# Patient Record
Sex: Male | Born: 1945 | Race: White | Hispanic: No | Marital: Married | Smoking: Never smoker
Health system: Southern US, Community
[De-identification: ages and names within clinical notes are randomized; demographics above are authoritative.]

## PROBLEM LIST (undated history)

## (undated) DIAGNOSIS — M109 Gout, unspecified: Secondary | ICD-10-CM

## (undated) DIAGNOSIS — K219 Gastro-esophageal reflux disease without esophagitis: Secondary | ICD-10-CM

## (undated) DIAGNOSIS — G7000 Myasthenia gravis without (acute) exacerbation: Secondary | ICD-10-CM

## (undated) DIAGNOSIS — C801 Malignant (primary) neoplasm, unspecified: Secondary | ICD-10-CM

## (undated) HISTORY — PX: PROSTATE SURGERY: SHX751

## (undated) HISTORY — PX: CHOLECYSTECTOMY: SHX55

---

## 2000-01-09 ENCOUNTER — Encounter: Payer: Self-pay | Admitting: Emergency Medicine

## 2000-01-09 ENCOUNTER — Emergency Department (HOSPITAL_COMMUNITY): Admission: EM | Admit: 2000-01-09 | Discharge: 2000-01-09 | Payer: Self-pay | Admitting: Emergency Medicine

## 2000-01-24 ENCOUNTER — Emergency Department (HOSPITAL_COMMUNITY): Admission: EM | Admit: 2000-01-24 | Discharge: 2000-01-24 | Payer: Self-pay

## 2000-01-27 ENCOUNTER — Encounter: Payer: Self-pay | Admitting: Emergency Medicine

## 2000-01-27 ENCOUNTER — Ambulatory Visit (HOSPITAL_COMMUNITY): Admission: EM | Admit: 2000-01-27 | Discharge: 2000-01-27 | Payer: Self-pay | Admitting: Emergency Medicine

## 2000-01-27 ENCOUNTER — Encounter: Payer: Self-pay | Admitting: Urology

## 2000-01-30 ENCOUNTER — Ambulatory Visit (HOSPITAL_COMMUNITY): Admission: RE | Admit: 2000-01-30 | Discharge: 2000-01-30 | Payer: Self-pay | Admitting: Urology

## 2000-01-30 ENCOUNTER — Encounter: Payer: Self-pay | Admitting: Urology

## 2000-02-03 ENCOUNTER — Emergency Department (HOSPITAL_COMMUNITY): Admission: EM | Admit: 2000-02-03 | Discharge: 2000-02-03 | Payer: Self-pay

## 2000-02-05 ENCOUNTER — Ambulatory Visit (HOSPITAL_COMMUNITY): Admission: RE | Admit: 2000-02-05 | Discharge: 2000-02-05 | Payer: Self-pay | Admitting: Urology

## 2004-10-01 DIAGNOSIS — K219 Gastro-esophageal reflux disease without esophagitis: Secondary | ICD-10-CM | POA: Diagnosis present

## 2005-03-02 ENCOUNTER — Ambulatory Visit: Payer: Self-pay | Admitting: Internal Medicine

## 2008-12-18 ENCOUNTER — Emergency Department (HOSPITAL_BASED_OUTPATIENT_CLINIC_OR_DEPARTMENT_OTHER): Admission: EM | Admit: 2008-12-18 | Discharge: 2008-12-18 | Payer: Self-pay | Admitting: Emergency Medicine

## 2009-11-18 ENCOUNTER — Ambulatory Visit: Admission: RE | Admit: 2009-11-18 | Discharge: 2009-12-09 | Payer: Self-pay | Admitting: Radiation Oncology

## 2010-02-01 ENCOUNTER — Encounter (INDEPENDENT_AMBULATORY_CARE_PROVIDER_SITE_OTHER): Payer: Self-pay | Admitting: Urology

## 2010-02-01 ENCOUNTER — Inpatient Hospital Stay (HOSPITAL_COMMUNITY): Admission: RE | Admit: 2010-02-01 | Discharge: 2010-02-03 | Payer: Self-pay | Admitting: Urology

## 2010-12-19 LAB — COMPREHENSIVE METABOLIC PANEL
ALT: 35 U/L (ref 0–53)
AST: 26 U/L (ref 0–37)
Albumin: 4.1 g/dL (ref 3.5–5.2)
Alkaline Phosphatase: 38 U/L — ABNORMAL LOW (ref 39–117)
CO2: 26 mEq/L (ref 19–32)
Chloride: 110 mEq/L (ref 96–112)
GFR calc Af Amer: 59 mL/min — ABNORMAL LOW (ref >60)
GFR calc non Af Amer: 49 mL/min — ABNORMAL LOW (ref >60)
Potassium: 3.8 mEq/L (ref 3.5–5.1)
Sodium: 143 mEq/L (ref 135–145)
Total Bilirubin: 0.7 mg/dL (ref 0.3–1.2)

## 2010-12-19 LAB — DIFFERENTIAL
Basophils Absolute: 0 10*3/uL (ref 0.0–0.1)
Basophils Relative: 0 % (ref 0–1)
Basophils Relative: 0 % (ref 0–1)
Eosinophils Absolute: 0 10*3/uL (ref 0.0–0.7)
Eosinophils Relative: 0 % (ref 0–5)
Lymphocytes Relative: 11 % — ABNORMAL LOW (ref 12–46)
Monocytes Absolute: 0.7 10*3/uL (ref 0.1–1.0)
Neutro Abs: 7.7 10*3/uL (ref 1.7–7.7)
Neutrophils Relative %: 81 % — ABNORMAL HIGH (ref 43–77)
Neutrophils Relative %: 89 % — ABNORMAL HIGH (ref 43–77)

## 2010-12-19 LAB — BASIC METABOLIC PANEL
BUN: 15 mg/dL (ref 6–23)
CO2: 26 mEq/L (ref 19–32)
CO2: 27 mEq/L (ref 19–32)
Calcium: 7.7 mg/dL — ABNORMAL LOW (ref 8.4–10.5)
Chloride: 106 mEq/L (ref 96–112)
Creatinine, Ser: 1.43 mg/dL (ref 0.4–1.5)
Creatinine, Ser: 1.43 mg/dL (ref 0.4–1.5)
GFR calc Af Amer: 60 mL/min — ABNORMAL LOW (ref >60)
GFR calc non Af Amer: 49 mL/min — ABNORMAL LOW (ref >60)
Glucose, Bld: 137 mg/dL — ABNORMAL HIGH (ref 70–99)
Glucose, Bld: 147 mg/dL — ABNORMAL HIGH (ref 70–99)
Potassium: 4.1 mEq/L (ref 3.5–5.1)
Sodium: 137 mEq/L (ref 135–145)

## 2010-12-19 LAB — ABO/RH: ABO/RH(D): A POS

## 2010-12-19 LAB — APTT: aPTT: 28 seconds (ref 24–37)

## 2010-12-19 LAB — CBC
HCT: 44.5 % (ref 39.0–52.0)
Hemoglobin: 13.1 g/dL (ref 13.0–17.0)
MCHC: 33.8 g/dL (ref 30.0–36.0)
MCHC: 33.9 g/dL (ref 30.0–36.0)
MCV: 88.4 fL (ref 78.0–100.0)
Platelets: 160 10*3/uL (ref 150–400)
Platelets: 182 10*3/uL (ref 150–400)
RBC: 4.35 MIL/uL (ref 4.22–5.81)
RBC: 4.78 MIL/uL (ref 4.22–5.81)
RDW: 13.1 % (ref 11.5–15.5)
RDW: 13.1 % (ref 11.5–15.5)
WBC: 5.5 10*3/uL (ref 4.0–10.5)

## 2010-12-19 LAB — URINALYSIS, ROUTINE W REFLEX MICROSCOPIC
Bilirubin Urine: NEGATIVE
Glucose, UA: NEGATIVE mg/dL
Hgb urine dipstick: NEGATIVE
Nitrite: NEGATIVE
Specific Gravity, Urine: 1.018 (ref 1.005–1.030)
pH: 5.5 (ref 5.0–8.0)

## 2010-12-19 LAB — CREATININE, FLUID (PLEURAL, PERITONEAL, JP DRAINAGE)

## 2010-12-19 LAB — TYPE AND SCREEN: ABO/RH(D): A POS

## 2011-02-16 NOTE — Op Note (Signed)
[facility]  Patient:    Tyrone Newton, Tyrone Newton                     MRN: [id number] Proc. Date: 01/27/00 Adm. Date:  [id number] Disc. Date: [id number] Attending:  Ilene Qua CC:         Maretta Bees. Vonita Moss, M.D.                           Operative Report  PREOPERATIVE DIAGNOSIS:  Left ureteral stone with hydronephrosis.  POSTOPERATIVE DIAGNOSIS:  Left ureteral stone with hydronephrosis with urethral  stricture.  OPERATION PERFORMED:  Cysto, urethral dilation, left retrograde ureteropyelogram, double-J stent placement.  SURGEON:  Bertram Millard. Dahlstedt, M.D.  ANESTHESIA:  General with endotracheal.  COMPLICATIONS:  None.  INDICATIONS FOR PROCEDURE:  The patient is a 65 year old male who has been suffering with a left ureteral calculus for over a week.  Today marks the fourth time in the past several days he has sought medical attention for this.  He is ot controlled adequately with Mepergan.  The patient has had no fever or chills.  The patient presented to the emergency department after talking with me on the phone today.  At this point he requests intervention of this stone with stent placement versus uteroscopy.  These options were discussed with the patient. He understands these and desires to proceed.  DESCRIPTION OF PROCEDURE:  The patient was taken to the operating room after intravenous preoperative anesthetics were administered.  He was placed in the dorsal lithotomy position after general endotracheal anesthesia was administered. His genitalia and perineum were prepped and draped.  A 25 French panendoscope was advanced through his anterior urethra.  Mild bulbous urethral stricture was encountered.  It was dilated first to 30 [place] using [employer].  The bladder was then inspected circumferentially.  It was found to be normal.  The eft ureteral orifice was cannulated and a retrograde pyelogram was performed.  This  showed a  normal ureter until the ureteropelvic junction was encountered.  At that point there was a filling defect and contrast showed mild hydro of the left kidney. This stone was too high to entertain using a rigid ureteroscope, as the patient was quite large and there was a very proximal stone.  At this point I felt it best o place double-J stent.  A glide wire was eventually advanced into the orifice and up to the ureter past the stone.  Using fluoroscopic and cystoscopic assistance, a  double-J stent (26 cm x 6 French Lubriflex) was placed.  The guide wire was removed and good proximal and distal curls were seen.  At this point the bladder was drained and the procedure terminated.  The patient tolerated the procedure well.  He was returned to the PACU in stable condition. DD:  01/27/00 TD:  01/30/00 Job: [id number]

## 2011-02-16 NOTE — Op Note (Signed)
[facility]  Patient:    Tyrone Newton, Tyrone Newton                     MRN: [id number] Proc. Date: 02/05/00 Adm. Date:  [id number] Disc. Date: [id number] Attending:  Lauree Chandler                           Operative Report  PREOPERATIVE DIAGNOSIS:  Left renal calculus.  POSTOPERATIVE DIAGNOSIS:  Left renal calculus.  PROCEDURE:  Cystoscopy, left ureteroscopic stone fragmentation with holmium laser, left retrograde pyelogram with interpretation with insertion of left double-J catheter.  SURGEON:  Maretta Bees. Vonita Moss, M.D.  ANESTHESIA:  General.  INDICATIONS:  This gentleman is 65 years old and has a remote history with stone. He now has had three week history of intermittent left flank pain due to a 3-4 m stone detected on CT that was not visible on KUB, and during the weekend, he had significant pain, and a double-J catheter was placed by Bertram Millard. Dahlstedt, M.D. I saw him in the office earlier this week and repeated a CT and the stone is in the left mid calyceal system.  The patient wanted intervention to try and get rid of this very troublesome stone.  Unfortunately, it could be seen on x-ray to perform ESL, so counseled about ureteroscopic approach.  DESCRIPTION OF PROCEDURE:  The patient was brought to the operating room and placed in the lithotomy position.  External genitalia were prepped and draped in the usual fashion.  He was cystoscoped and the lower end of the left double-J catheter was visualized and removed with alligator forceps.  I could not get a flexible guidewire up of a middle variety, so I used a guidewire M which did go up easily and all the way into the renal collecting system.  Over this, I passed an open-ended ureteral catheter, replaced the guidewire M with a metal guidewire.  Over the metal guidewire, I passed the ureteral sheath dilating mechanism which  went quite nicely.  With the guidewire still in place, I  used the flexible ultrasound thin ureteroscope and negotiated my way easily into the kidney where I found the gold and yellow stone in the mid calyceal system.  I tried to grab the stone with a cherry picker, but it would not hold the stone adequately.  I then  used the [facility] stone basket and secured the stone, but it would not go through the UPJ very easily.  I felt it was hazardous to do so, so I released the stone from the basket, and then utilized the holmium laser and fragmented the stone into five or six smaller pieces, and some fine stony debris.  At this point, I performed a left retrograde pyelogram which showed the pyelocalyceal system to be intact, and I removed the ureteroscope, and back loaded the guidewire into a cystoscope, and then placed a 6-French 26 cm double-J catheter with a coil in the renal pelvis, and a full coil in the bladder, and the string  brought out through urethra.  X-rays and photos documenting the pyelogram and the presence of the stone before and after fragmentation were completed.  The patient tolerated the procedure well and was taken to the recovery room in ood condition. DD:  02/05/00 TD:  02/06/00 Job: [id number]

## 2013-07-28 ENCOUNTER — Encounter (HOSPITAL_BASED_OUTPATIENT_CLINIC_OR_DEPARTMENT_OTHER): Payer: Self-pay | Admitting: Emergency Medicine

## 2013-07-28 ENCOUNTER — Emergency Department (HOSPITAL_BASED_OUTPATIENT_CLINIC_OR_DEPARTMENT_OTHER)
Admission: EM | Admit: 2013-07-28 | Discharge: 2013-07-29 | Disposition: A | Discharge status: Home / Self Care | Payer: Medicare Other | Attending: Emergency Medicine | Admitting: Emergency Medicine

## 2013-07-28 DIAGNOSIS — N390 Urinary tract infection, site not specified: Secondary | ICD-10-CM | POA: Insufficient documentation

## 2013-07-28 DIAGNOSIS — R5381 Other malaise: Secondary | ICD-10-CM | POA: Insufficient documentation

## 2013-07-28 DIAGNOSIS — Z8546 Personal history of malignant neoplasm of prostate: Secondary | ICD-10-CM | POA: Insufficient documentation

## 2013-07-28 LAB — URINE MICROSCOPIC-ADD ON

## 2013-07-28 LAB — URINALYSIS, ROUTINE W REFLEX MICROSCOPIC
Specific Gravity, Urine: 1.017 (ref 1.005–1.030)
Urobilinogen, UA: 1 mg/dL (ref 0.0–1.0)
pH: 6 (ref 5.0–8.0)

## 2013-07-28 MED ORDER — ACETAMINOPHEN 325 MG PO TABS
650.0000 mg | ORAL_TABLET | Freq: Once | ORAL | Status: AC
  Administered 2013-07-28: 650 mg via ORAL
  Filled 2013-07-28: qty 2

## 2013-07-28 NOTE — ED Notes (Signed)
Wife has been taking Pt. Temp and B/P at home with elevated temp and b/p.  Pt. Reports he does not take b/p meds.

## 2013-07-29 LAB — URINE CULTURE

## 2013-07-29 MED ORDER — CIPROFLOXACIN HCL 500 MG PO TABS: 500.0000 mg | ORAL_TABLET | Freq: Two times a day (BID) | ORAL | Status: DC

## 2013-07-29 MED ORDER — CIPROFLOXACIN HCL 500 MG PO TABS
500.0000 mg | ORAL_TABLET | Freq: Once | ORAL | Status: AC
  Administered 2013-07-29: 500 mg via ORAL
  Filled 2013-07-29: qty 1

## 2013-07-29 NOTE — ED Provider Notes (Signed)
CSN: [id number]     Arrival date & time 07/28/13  2042 History   First MD Initiated Contact with Patient 07/29/13 0056     Chief Complaint  Patient presents with  . Fever   (Consider location/radiation/quality/duration/timing/severity/associated sxs/prior Treatment) HPI This is a 67 year old male who is healthy apart from a history of prostate cancer and occasional urinary tract infections. He was mowing his lawn yesterday afternoon when he became extremely fatigued is unusual for him. He was unable to finish mowing his lawn due to this fatigue. His wife checked his blood pressure several times and found to be in the 160s over 80s and he had a temperature of 102. He has had urinary frequency but no burning with urination. He has had nausea and decreased appetite. He denies chills. He denies abdominal pain. He denies vomiting or diarrhea.  He has partial hearing loss and did not bring his hearing aids with him.  No past medical history on file. Past Surgical History  Procedure Laterality Date  . Prostate surgery      due to cancer   No family history on file. History  Substance Use Topics  . Smoking status: Never Smoker   . Smokeless tobacco: Not on file  . Alcohol Use: No    Review of Systems  All other systems reviewed and are negative.    Allergies  Review of patient's allergies indicates no known allergies.  Home Medications  No current outpatient prescriptions on file. BP 153/79  Pulse 72  Temp(Src) 98.2 F (36.8 C) (Oral)  Resp 18  Ht 6\' 2"  (1.88 m)  Wt 240 lb (108.863 kg)  BMI 30.8 kg/m2  SpO2 100%  Physical Exam General: Well-developed, well-nourished male in no acute distress; appearance consistent with age of record HENT: normocephalic; atraumatic; hard of hearing Eyes: pupils equal, round and reactive to light; extraocular muscles intact Neck: supple Heart: regular rate and rhythm Lungs: clear to auscultation bilaterally Abdomen: soft; nondistended;  slight suprapubic tenderness; no masses or hepatosplenomegaly; bowel sounds present GU: No CVA tenderness Extremities: No deformity; full range of motion; pulses normal; no edema Neurologic: Awake, alert and oriented; motor function intact in all extremities and symmetric; no facial droop Skin: Warm and dry Psychiatric: Normal mood and affect     ED Course  Procedures (including critical care time)  MDM   Nursing notes and vitals signs, including pulse oximetry, reviewed.  Summary of this visit's results, reviewed by myself:  Labs:  Results for orders placed during the hospital encounter of 07/28/13 (from the past 24 hour(s))  URINALYSIS, ROUTINE W REFLEX MICROSCOPIC     Status: Abnormal   Collection Time    07/28/13  9:09 PM      Result Value Range   Color, Urine YELLOW  YELLOW   APPearance CLEAR  CLEAR   Specific Gravity, Urine 1.017  1.005 - 1.030   pH 6.0  5.0 - 8.0   Glucose, UA NEGATIVE  NEGATIVE mg/dL   Hgb urine dipstick MODERATE (*) NEGATIVE   Bilirubin Urine NEGATIVE  NEGATIVE   Ketones, ur 15 (*) NEGATIVE mg/dL   Protein, ur 30 (*) NEGATIVE mg/dL   Urobilinogen, UA 1.0  0.0 - 1.0 mg/dL   Nitrite POSITIVE (*) NEGATIVE   Leukocytes, UA MODERATE (*) NEGATIVE  URINE MICROSCOPIC-ADD ON     Status: Abnormal   Collection Time    07/28/13  9:09 PM      Result Value Range   Squamous Epithelial / LPF RARE  RARE   WBC, UA 11-20  <3 WBC/hpf   RBC / HPF 7-10  <3 RBC/hpf   Bacteria, UA FEW (*) RARE   The patient and his wife were reassured that the blood pressure in the 160s over the 80s systolic short-term emergency. He should monitor his blood pressure on a daily basis and discuss long-term management with his primary care physician.    Hanley Seamen, MD 07/29/13 [phone]

## 2014-09-29 DIAGNOSIS — M109 Gout, unspecified: Secondary | ICD-10-CM | POA: Diagnosis present

## 2017-07-16 ENCOUNTER — Encounter (HOSPITAL_COMMUNITY): Payer: Self-pay

## 2017-07-16 ENCOUNTER — Emergency Department (HOSPITAL_COMMUNITY)
Admission: EM | Admit: 2017-07-16 | Discharge: 2017-07-16 | Disposition: A | Discharge status: Home / Self Care | Payer: Medicare Other | Attending: Emergency Medicine | Admitting: Emergency Medicine

## 2017-07-16 DIAGNOSIS — R110 Nausea: Secondary | ICD-10-CM | POA: Insufficient documentation

## 2017-07-16 DIAGNOSIS — R42 Dizziness and giddiness: Secondary | ICD-10-CM | POA: Diagnosis present

## 2017-07-16 DIAGNOSIS — Z79899 Other long term (current) drug therapy: Secondary | ICD-10-CM | POA: Diagnosis not present

## 2017-07-16 LAB — CBC
HEMATOCRIT: 39.1 % (ref 39.0–52.0)
Hemoglobin: 13.6 g/dL (ref 13.0–17.0)
MCH: 30.4 pg (ref 26.0–34.0)
MCHC: 34.8 g/dL (ref 30.0–36.0)
MCV: 87.5 fL (ref 78.0–100.0)
Platelets: 150 10*3/uL (ref 150–400)
RBC: 4.47 MIL/uL (ref 4.22–5.81)
RDW: 13.9 % (ref 11.5–15.5)
WBC: 6.1 10*3/uL (ref 4.0–10.5)

## 2017-07-16 LAB — BASIC METABOLIC PANEL
ANION GAP: 6 (ref 5–15)
BUN: 19 mg/dL (ref 6–20)
CO2: 22 mmol/L (ref 22–32)
Calcium: 8.7 mg/dL — ABNORMAL LOW (ref 8.9–10.3)
Chloride: 110 mmol/L (ref 101–111)
Creatinine, Ser: 1.52 mg/dL — ABNORMAL HIGH (ref 0.61–1.24)
GFR calc non Af Amer: 44 mL/min — ABNORMAL LOW (ref >60)
GFR, EST AFRICAN AMERICAN: 51 mL/min — AB (ref >60)
Glucose, Bld: 179 mg/dL — ABNORMAL HIGH (ref 65–99)
POTASSIUM: 3.8 mmol/L (ref 3.5–5.1)
Sodium: 138 mmol/L (ref 135–145)

## 2017-07-16 MED ORDER — SODIUM CHLORIDE 0.9 % IV BOLUS (SEPSIS)
1000.0000 mL | Freq: Once | INTRAVENOUS | Status: AC
  Administered 2017-07-16: 1000 mL via INTRAVENOUS

## 2017-07-16 NOTE — ED Triage Notes (Signed)
Pt developed dizziness with nausea at approx 1400 this afternoon, following lunch.  Pt's only hx is GERD, GOUT and Prostate CA.  Pt denies nausea upon arrival to ed and vs are stable

## 2017-07-16 NOTE — ED Provider Notes (Signed)
[place] EMERGENCY DEPARTMENT Provider Note   CSN: [id number] Arrival date & time: 07/16/17  1520     History   Chief Complaint Chief Complaint  Patient presents with  . Dizziness    HPI Tyrone Newton is a 71 y.o. male.  HPI Patient presents to the emergency room for evaluation of lightheadedness. Patient was doing well today. He woke up in his usual health. He had lunch this afternoon. About 2 PM he suddenly started to feel nauseated and lightheaded. He's had mild symptoms like this in the past but they have been very brief and resolved on the room. The symptoms persisted so he decided to go to the fire department to have his blood pressure checked.  His blood pressure was elevated in the 277O systolic. They suggested he come to the emergency room for evaluation. Patient states since then the symptoms have all resolved. He denies any dizziness or lightheadedness. He never had no trouble with any chest pain. In no difficulty with his speech or vision. He was not having any room spinning sensation or vertigo. He denies any fevers chills vomiting or diarrhea. Patient feels well now. No past medical history on file.  There are no active problems to display for this patient.   Past Surgical History:  Procedure Laterality Date  . PROSTATE SURGERY     due to cancer       Home Medications    Prior to Admission medications   Medication Sig Start Date End Date Taking? Authorizing Provider  acetaminophen (TYLENOL) 500 MG tablet Take 500 mg by mouth every 6 (six) hours as needed (for muscle soreness or pain).   Yes [provider]  allopurinol (ZYLOPRIM) 300 MG tablet Take 150 mg by mouth daily.   Yes [provider]  diphenhydrAMINE (BENADRYL) 25 mg capsule Take 25 mg by mouth at bedtime as needed for sleep.   Yes [provider]  docusate sodium (COLACE) 100 MG capsule Take 100 mg by mouth daily.   Yes [provider]    omeprazole (PRILOSEC) 20 MG capsule Take 20 mg by mouth daily.   Yes [provider]  OVER THE COUNTER MEDICATION Seaweed tablets: Take 1 tablet by mouth once a day   Yes [provider]  ciprofloxacin (CIPRO) 500 MG tablet Take 1 tablet (500 mg total) by mouth 2 (two) times daily. Patient not taking: Reported on 07/16/2017 07/29/13   Molpus, Jenny Reichmann, MD    Family History No family history on file.  Social History Social History  Substance Use Topics  . Smoking status: Never Smoker  . Smokeless tobacco: Never Used  . Alcohol use No     Allergies   Patient has no known allergies.   Review of Systems Review of Systems  All other systems reviewed and are negative.    Physical Exam Updated Vital Signs BP 121/68 (BP Location: Left Arm)   Pulse 71   Temp 98.5 F (36.9 C) (Oral)   Resp 16   Ht 1.905 m (6\' 3" )   Wt 111.1 kg (245 lb)   SpO2 98%   BMI 30.62 kg/m   Physical Exam  Constitutional: He is oriented to person, place, and time. He appears well-developed and well-nourished. No distress.  HENT:  Head: Normocephalic and atraumatic.  Right Ear: External ear normal.  Left Ear: External ear normal.  Mouth/Throat: Oropharynx is clear and moist.  Eyes: Conjunctivae are normal. Right eye exhibits no discharge. Left eye exhibits  no discharge. No scleral icterus.  Neck: Neck supple. No tracheal deviation present.  Cardiovascular: Normal rate, regular rhythm and intact distal pulses.   Pulmonary/Chest: Effort normal and breath sounds normal. No stridor. No respiratory distress. He has no wheezes. He has no rales.  Abdominal: Soft. Bowel sounds are normal. He exhibits no distension. There is no tenderness. There is no rebound and no guarding.  Musculoskeletal: He exhibits no edema or tenderness.  Neurological: He is alert and oriented to person, place, and time. He has normal strength. No cranial nerve deficit (No facial droop, extraocular movements intact,  tongue midline ) or sensory deficit. He exhibits normal muscle tone. He displays no seizure activity. Coordination normal.  No pronator drift bilateral upper extrem, able to hold both legs off bed for 5 seconds, sensation intact in all extremities, no visual field cuts, no left or right sided neglect, normal finger-nose exam bilaterally, no nystagmus noted  Patient is able to stand up and walk around the emergency room without any ataxia or recurrent dizziness  Skin: Skin is warm and dry. No rash noted.  Psychiatric: He has a normal mood and affect.  Nursing note and vitals reviewed.    ED Treatments / Results  Labs (all labs ordered are listed, but only abnormal results are displayed) Labs Reviewed  BASIC METABOLIC PANEL - Abnormal; Notable for the following:       Result Value   Glucose, Bld 179 (*)    Creatinine, Ser 1.52 (*)    Calcium 8.7 (*)    GFR calc non Af Amer 44 (*)    GFR calc Af Amer 51 (*)    All other components within normal limits  CBC    EKG  EKG Interpretation  Date/Time:  Tuesday July 16 2017 15:21:31 EDT Ventricular Rate:  75 PR Interval:  246 QRS Duration: 80 QT Interval:  402 QTC Calculation: 448 R Axis:   12 Text Interpretation:  Sinus rhythm with 1st degree A-V block Otherwise normal ECG No significant change since last tracing except pr prolongation   Confirmed by Dorie Rank [phone]) on 07/16/2017 4:42:06 PM       Radiology No results found.  Procedures Procedures (including critical care time)  Medications Ordered in ED Medications  sodium chloride 0.9 % bolus 1,000 mL (0 mLs Intravenous Stopped 07/16/17 1751)     Initial Impression / Assessment and Plan / ED Course  I have reviewed the triage vital signs and the nursing notes.  Pertinent labs & imaging results that were available during my care of the patient were reviewed by me and considered in my medical decision making (see chart for details).   Pt presented to the ED with  episodic lightheadedness.  No clear vertigo.  No chest pain or shortness of breath.  In ED, sx had resolved.  Pt was able to walk around without difficulty.  Normal neuro exam.  Normal EKG and labs.  Doubt stroke, TIA.   No sign of cardiac abnormality in the ED. Appears stable for outpatient follow up.  Consider holter monitor, additional testing.  Warning signs and precautions discussed.  Final Clinical Impressions(s) / ED Diagnoses   Final diagnoses:  Dizziness    New Prescriptions New Prescriptions   No medications on file     Dorie Rank, MD 07/16/17 [phone]

## 2017-07-16 NOTE — Discharge Instructions (Signed)
Follow up with your primary care doctor for further evaluation as we discussed, return to the ED for trouble with speech, coordination, chest pain or other concerning symptoms.

## 2018-01-05 ENCOUNTER — Emergency Department (HOSPITAL_BASED_OUTPATIENT_CLINIC_OR_DEPARTMENT_OTHER): Payer: Medicare Other

## 2018-01-05 ENCOUNTER — Other Ambulatory Visit: Payer: Self-pay

## 2018-01-05 ENCOUNTER — Encounter (HOSPITAL_BASED_OUTPATIENT_CLINIC_OR_DEPARTMENT_OTHER): Payer: Self-pay | Admitting: Emergency Medicine

## 2018-01-05 ENCOUNTER — Emergency Department (HOSPITAL_BASED_OUTPATIENT_CLINIC_OR_DEPARTMENT_OTHER)
Admission: EM | Admit: 2018-01-05 | Discharge: 2018-01-05 | Disposition: A | Discharge status: Home / Self Care | Payer: Medicare Other | Attending: Emergency Medicine | Admitting: Emergency Medicine

## 2018-01-05 DIAGNOSIS — R05 Cough: Secondary | ICD-10-CM | POA: Diagnosis not present

## 2018-01-05 DIAGNOSIS — R059 Cough, unspecified: Secondary | ICD-10-CM

## 2018-01-05 DIAGNOSIS — Z8546 Personal history of malignant neoplasm of prostate: Secondary | ICD-10-CM | POA: Insufficient documentation

## 2018-01-05 DIAGNOSIS — R0602 Shortness of breath: Secondary | ICD-10-CM | POA: Insufficient documentation

## 2018-01-05 DIAGNOSIS — Z79899 Other long term (current) drug therapy: Secondary | ICD-10-CM | POA: Insufficient documentation

## 2018-01-05 DIAGNOSIS — R911 Solitary pulmonary nodule: Secondary | ICD-10-CM | POA: Diagnosis not present

## 2018-01-05 DIAGNOSIS — R062 Wheezing: Secondary | ICD-10-CM

## 2018-01-05 HISTORY — DX: Gastro-esophageal reflux disease without esophagitis: K21.9

## 2018-01-05 HISTORY — DX: Malignant (primary) neoplasm, unspecified: C80.1

## 2018-01-05 HISTORY — DX: Gout, unspecified: M10.9

## 2018-01-05 LAB — CBC WITH DIFFERENTIAL/PLATELET
BASOS ABS: 0 10*3/uL (ref 0.0–0.1)
BASOS PCT: 1 %
EOS ABS: 0.2 10*3/uL (ref 0.0–0.7)
Eosinophils Relative: 2 %
HCT: 41.9 % (ref 39.0–52.0)
HEMOGLOBIN: 14.2 g/dL (ref 13.0–17.0)
Lymphocytes Relative: 19 %
Lymphs Abs: 1.3 10*3/uL (ref 0.7–4.0)
MCH: 29.6 pg (ref 26.0–34.0)
MCHC: 33.9 g/dL (ref 30.0–36.0)
MCV: 87.5 fL (ref 78.0–100.0)
Monocytes Absolute: 0.5 10*3/uL (ref 0.1–1.0)
Monocytes Relative: 8 %
NEUTROS PCT: 70 %
Neutro Abs: 4.8 10*3/uL (ref 1.7–7.7)
Platelets: 158 10*3/uL (ref 150–400)
RBC: 4.79 MIL/uL (ref 4.22–5.81)
RDW: 13.5 % (ref 11.5–15.5)
WBC: 6.8 10*3/uL (ref 4.0–10.5)

## 2018-01-05 LAB — COMPREHENSIVE METABOLIC PANEL
ALT: 21 U/L (ref 17–63)
ANION GAP: 10 (ref 5–15)
AST: 19 U/L (ref 15–41)
Albumin: 4.1 g/dL (ref 3.5–5.0)
Alkaline Phosphatase: 51 U/L (ref 38–126)
BUN: 20 mg/dL (ref 6–20)
CO2: 22 mmol/L (ref 22–32)
Calcium: 8.8 mg/dL — ABNORMAL LOW (ref 8.9–10.3)
Chloride: 106 mmol/L (ref 101–111)
Creatinine, Ser: 1.35 mg/dL — ABNORMAL HIGH (ref 0.61–1.24)
GFR calc Af Amer: 58 mL/min — ABNORMAL LOW (ref >60)
GFR calc non Af Amer: 50 mL/min — ABNORMAL LOW (ref >60)
Glucose, Bld: 161 mg/dL — ABNORMAL HIGH (ref 65–99)
POTASSIUM: 4.1 mmol/L (ref 3.5–5.1)
SODIUM: 138 mmol/L (ref 135–145)
Total Bilirubin: 0.9 mg/dL (ref 0.3–1.2)
Total Protein: 6.8 g/dL (ref 6.5–8.1)

## 2018-01-05 LAB — TROPONIN I: Troponin I: <0.03 ng/mL (ref <0.03)

## 2018-01-05 LAB — BRAIN NATRIURETIC PEPTIDE: B Natriuretic Peptide: 42.9 pg/mL (ref 0.0–100.0)

## 2018-01-05 MED ORDER — ALBUTEROL SULFATE HFA 108 (90 BASE) MCG/ACT IN AERS: 2.0000 | INHALATION_SPRAY | Freq: Once | RESPIRATORY_TRACT | Status: DC

## 2018-01-05 MED ORDER — IOPAMIDOL (ISOVUE-370) INJECTION 76%
100.0000 mL | Freq: Once | INTRAVENOUS | Status: AC | PRN
  Administered 2018-01-05: 75 mL via INTRAVENOUS

## 2018-01-05 MED ORDER — IPRATROPIUM-ALBUTEROL 0.5-2.5 (3) MG/3ML IN SOLN
3.0000 mL | Freq: Four times a day (QID) | RESPIRATORY_TRACT | Status: DC
  Administered 2018-01-05: 3 mL via RESPIRATORY_TRACT
  Filled 2018-01-05: qty 3

## 2018-01-05 MED ORDER — ALBUTEROL SULFATE HFA 108 (90 BASE) MCG/ACT IN AERS
INHALATION_SPRAY | RESPIRATORY_TRACT | Status: AC
  Administered 2018-01-05: 2
  Filled 2018-01-05: qty 6.7

## 2018-01-05 NOTE — Discharge Instructions (Signed)
Your workup today revealed evidence of the plaques and a pulmonary nodule which may be contributing to your shortness of breath and wheezing.  We did not see any evidence of ongoing pneumonia or infection.  I suspect you have a component of reactive airway disease that the albuterol will help.  Please use the albuterol inhaler as directed.  Please take the imaging and follow-up with your primary  care physician and pulmonologist.  If any symptoms change or worsen, please return the nearest emergency department.

## 2018-01-05 NOTE — ED Notes (Signed)
Patient transported to CT 

## 2018-01-05 NOTE — ED Triage Notes (Addendum)
Dry cough for 6 weeks. States he has been seen by UC 4 times and taking allergy medication without relief.

## 2018-01-05 NOTE — ED Provider Notes (Signed)
[name]   CSN: [id number] Arrival date & time: 01/05/18  4270     History   Chief Complaint Chief Complaint  Patient presents with  . Cough    HPI Tyrone Newton is a 72 y.o. male.  The history is provided by the patient, medical records and the spouse.  Cough  This is a new problem. The current episode started more than 1 week ago. The problem occurs constantly. The problem has not changed since onset.The cough is non-productive. There has been no fever. Associated symptoms include shortness of breath (mild) and wheezing. Pertinent negatives include no chest pain, no chills, no ear congestion, no headaches, no rhinorrhea and no sore throat. He has tried nothing for the symptoms. The treatment provided no relief. He is not a smoker. His past medical history does not include COPD or asthma.    Past Medical History:  Diagnosis Date  . Cancer ([place])   . GERD (gastroesophageal reflux disease)   . Gout     There are no active problems to display for this patient.   Past Surgical History:  Procedure Laterality Date  . CHOLECYSTECTOMY    . PROSTATE SURGERY     due to cancer        Home Medications    Prior to Admission medications   Medication Sig Start Date End Date Taking? Authorizing Provider  allopurinol (ZYLOPRIM) 300 MG tablet Take 150 mg by mouth daily.   Yes [provider]  omeprazole (PRILOSEC) 20 MG capsule Take 20 mg by mouth daily.   Yes [provider]  acetaminophen (TYLENOL) 500 MG tablet Take 500 mg by mouth every 6 (six) hours as needed (for muscle soreness or pain).    [provider]  ciprofloxacin (CIPRO) 500 MG tablet Take 1 tablet (500 mg total) by mouth 2 (two) times daily. Patient not taking: Reported on 07/16/2017 07/29/13   Molpus, John, MD  diphenhydrAMINE (BENADRYL) 25 mg capsule Take 25 mg by mouth at bedtime as needed for sleep.    [provider]    docusate sodium (COLACE) 100 MG capsule Take 100 mg by mouth daily.    [provider]  OVER THE COUNTER MEDICATION Seaweed tablets: Take 1 tablet by mouth once a day    [provider]    Family History No family history on file.  Social History Social History   Tobacco Use  . Smoking status: Never Smoker  . Smokeless tobacco: Never Used  Substance Use Topics  . Alcohol use: No  . Drug use: No     Allergies   Patient has no known allergies.   Review of Systems Review of Systems  Constitutional: Negative for chills, diaphoresis, fatigue and fever.  HENT: Positive for congestion. Negative for rhinorrhea and sore throat.   Eyes: Negative for visual disturbance.  Respiratory: Positive for cough, shortness of breath (mild) and wheezing. Negative for choking, chest tightness and stridor.   Cardiovascular: Negative for chest pain, palpitations and leg swelling.  Gastrointestinal: Negative for constipation, diarrhea, nausea and vomiting.  Genitourinary: Negative for dysuria and flank pain.  Musculoskeletal: Negative for back pain, neck pain and neck stiffness.  Skin: Negative for rash and wound.  Neurological: Negative for light-headedness and headaches.  Psychiatric/Behavioral: Negative for agitation.  All other systems reviewed and are negative.    Physical Exam Updated Vital Signs BP 135/76 (BP Location: Left Arm)   Pulse 74   Temp 98.4 F (  36.9 C) (Oral)   Resp 16   SpO2 100%   Physical Exam  Constitutional: He is oriented to person, place, and time. He appears well-developed and well-nourished. No distress.  HENT:  Head: Normocephalic.  Nose: Nose normal.  Mouth/Throat: Oropharynx is clear and moist. No oropharyngeal exudate.  Eyes: Pupils are equal, round, and reactive to light. Conjunctivae and EOM are normal. No scleral icterus.  Neck: Normal range of motion. Neck supple.  Cardiovascular: Normal rate.  No murmur heard. Pulmonary/Chest:  Effort normal. No stridor. No respiratory distress. He has wheezes. He has no rales. He exhibits no tenderness.  Abdominal: Soft. Bowel sounds are normal. He exhibits no distension. There is no tenderness.  Musculoskeletal: He exhibits no edema, tenderness or deformity.  Lymphadenopathy:    He has no cervical adenopathy.  Neurological: He is alert and oriented to person, place, and time. He exhibits normal muscle tone.  Skin: Skin is warm. Capillary refill takes less than 2 seconds. He is not diaphoretic. No erythema. No pallor.  Psychiatric: He has a normal mood and affect.  Nursing note and vitals reviewed.    ED Treatments / Results  Labs (all labs ordered are listed, but only abnormal results are displayed) Labs Reviewed  COMPREHENSIVE METABOLIC PANEL - Abnormal; Notable for the following components:      Result Value   Glucose, Bld 161 (*)    Creatinine, Ser 1.35 (*)    Calcium 8.8 (*)    GFR calc non Af Amer 50 (*)    GFR calc Af Amer 58 (*)    All other components within normal limits  CBC WITH DIFFERENTIAL/PLATELET  BRAIN NATRIURETIC PEPTIDE  TROPONIN I    EKG EKG Interpretation  Date/Time:  Sunday January 05 2018 09:57:09 EDT Ventricular Rate:  76 PR Interval:    QRS Duration: 83 QT Interval:  391 QTC Calculation: 440 R Axis:   51 Text Interpretation:  Sinus rhythm Prolonged PR interval Low voltage, precordial leads When compared to prior, no signifiacnt changes seen.  No STEMI Confirmed by Antony Blackbird [phone]) on 01/05/2018 10:16:06 AM Also confirmed by Antony Blackbird [phone]), editor Lynder Parents [phone])  on 01/05/2018 11:07:42 AM   Radiology Dg Chest 2 View  Result Date: 01/05/2018 CLINICAL DATA:  Six week history of cough and chest congestion. Patient has undergone 2 cycles of antibiotic therapy and a cycle of corticosteroid therapy without relief. EXAM: CHEST - 2 VIEW COMPARISON:  01/25/2010. FINDINGS: Cardiomediastinal silhouette unremarkable, unchanged.  Vertically oriented oval opacity laterally at the LEFT lung base on the PA image without correlate on the LATERAL image. Lungs otherwise clear. Normal bronchovascular markings. Normal pulmonary vascularity. No pleural effusions. Degenerative changes and DISH involving the thoracic spine. IMPRESSION: 1. Opacity projected over the LATERAL LEFT lung base on the PA image without correlate on the LATERAL image, most likely a calcified pleural plaque. Does the patient have a history of asbestos exposure? 2. No acute cardiopulmonary disease. Given the patient's personal history of prostate cancer, CT of the chest with contrast is recommended in further evaluation, which could be performed on a non-emergent basis. Electronically Signed   By: Evangeline Dakin M.D.   On: 01/05/2018 09:37   Ct Chest W Contrast  Result Date: 01/05/2018 CLINICAL DATA:  Cough and congestion for 6 weeks. EXAM: CT CHEST WITH CONTRAST TECHNIQUE: Multidetector CT imaging of the chest was performed during intravenous contrast administration. CONTRAST:  52mL ISOVUE-370 IOPAMIDOL (ISOVUE-370) INJECTION 76% COMPARISON:  None. FINDINGS: Cardiovascular: The thoracic  aorta measures 4.5 cm on series 2, image 81 in the ascending portion. The measurement 4.8 cm just above the heart on image 93. No aneurysmal dilatation in the arch or descending portions of the thoracic aorta. No dissection. No significant atherosclerotic change. The main pulmonary artery is normal in caliber with no obvious central filling defects. The heart is normal. No coronary artery calcifications are noted. Mediastinum/Nodes: Small nodules in the thyroid require no follow-up. No adenopathy seen in the chest. No effusions. Lungs/Pleura: Central airways are normal. No pneumothorax. No suspicious infiltrates. A small nodular density on axial image 98 is noted to be flat in appearance along the right minor fissure based on coronal images. This is of no significance. No other suspicious  nodule seen within the lungs. No masses within the lungs. Numerous pleural plaques are identified, several of which are partially calcified. A more rounded nodular region is seen along the posterior left upper pleura measuring 18 by 8 mm. Upper Abdomen: Previous cholecystectomy.  Left renal cysts. Musculoskeletal: Degenerative changes in the thoracic spine. IMPRESSION: 1. 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 Most of the regions of pleural thickening are clearly plaques. There is a single 18 mm more nodular region of pleural thickening posteriorly on the left which could represent a nodular plaque. However, the patient is at increased risk for mesothelioma. Recommend consultation with a pulmonologist. This more nodular region of thickening could be further assessed with close follow-up versus a PET-CT. 2. Thoracic aortic aneurysm measuring up to 4.8 cm. Electronically Signed   By: Dorise Bullion III M.D   On: 01/05/2018 11:57    Procedures Procedures (including critical care time)  Medications Ordered in ED Medications  iopamidol (ISOVUE-370) 76 % injection 100 mL (75 mLs Intravenous Contrast Given 01/05/18 1121)  albuterol (PROVENTIL HFA;VENTOLIN HFA) 108 (90 Base) MCG/ACT inhaler (2 puffs  Given 01/05/18 1253)     Initial Impression / Assessment and Plan / ED Course  I have reviewed the triage vital signs and the nursing notes.  Pertinent labs & imaging results that were available during my care of the patient were reviewed by me and considered in my medical decision making (see chart for details).     COWAN PILAR is a 72 y.o. male with a past medical history significant for prostate cancer and gout who presents with shortness of breath and cough.  Patient reports that he has had 6 weeks of intermittent productive cough.  He denies any recent fevers but does report a chill last night.  He denies nausea, vomiting, conservation,  diarrhea, or dysuria.  He denies recent trauma.  He does report that his cough is worsened when he lays flat with shortness of breath.  He denies any history of CHF or heart disease.  He reports that years ago he had his prostate cancer treated with surgery.  He denies any current chest pain or palpitations.  He does report that he has had somewhat of a phlegm-like cough over the last few days.  He reports that he was seen at urgent care multiple times but has never had any imaging performed.  He reports that he was given several treatments of steroids and antibiotic he does not remember what kind.  On exam, patient has some mild wheezing and coarse breath sounds.  Patient had no chest tenderness or back tenderness.  Abdomen was nontender.  Patient had no edema seen in his legs.  Normal pulses in all extremities.  Patient had chest x-ray showing evidence of a opacity that a CT will be ordered to further evaluate.  Patient's initial EKG was overall reassuring with no evidence of STEMI.  Patient had negative troponin and negative BNP.  CBC and CMP was overall reassuring however patient did have slight elevation in creatinine to 1.35   Which is improved from prior.  Patient given a DuoNeb breathing treatment with significant improvement in his breathing.  Suspect patient may have a component of reactive airway disease developing.  Will obtain the contrasted CT scan to look for malignancy or pneumonia.  If this is reassuring, anticipate patient will be stable for discharge home with a inhaler.  CT scan returned showing evidence of a pulmonary plaque concerning for asbestos exposure as well as a nodule.  Next  Patient was informed of this and he reports he has had workup for asbestos problems in the past.  He is unsure if this is change from prior however he will follow-up with his pulmonologist and PCP with the [facility].  Patient was feeling better after the breathing treatment.  His coughing is also  improved.  Suspect patient has developed component of reactive airway disease in the setting of this possible lung nodules/plaques.  Do not feel patient has pneumonia or other infection requiring antibiotics.  Do not feel patient has pneumonia given the reassuring CT scan.  Patient will be  given an albuterol inhaler as well as instructions to follow-up.  Patient understood return precautions for new or worsened symptoms.  Patient discharged in good condition with improved symptoms.   Final Clinical Impressions(s) / ED Diagnoses   Final diagnoses:  Cough  Wheezing  Pulmonary nodule    ED Discharge Orders    None      Clinical Impression: 1. Cough   2. Wheezing   3. Pulmonary nodule     Disposition: Discharge  Condition: Good  I have discussed the results, Dx and Tx plan with the pt(& family if present). He/she/they expressed understanding and agree(s) with the plan. Discharge instructions discussed at great length. Strict return precautions discussed and pt &/or family have verbalized understanding of the instructions. No further questions at time of discharge.    Discharge Medication List as of 01/05/2018 12:59 PM      Follow Up: Windy Fast, MD [place] [address]     [facility] EMERGENCY DEPARTMENT [address] [id number] [facility] [place] [phone]       Rolla Servidio, Gwenyth Allegra, MD 01/05/18 2003

## 2018-01-06 ENCOUNTER — Emergency Department (HOSPITAL_COMMUNITY)
Admission: EM | Admit: 2018-01-06 | Discharge: 2018-01-07 | Disposition: A | Discharge status: Home / Self Care | Payer: Medicare Other | Attending: Emergency Medicine | Admitting: Emergency Medicine

## 2018-01-06 ENCOUNTER — Encounter (HOSPITAL_COMMUNITY): Payer: Self-pay | Admitting: *Deleted

## 2018-01-06 DIAGNOSIS — R059 Cough, unspecified: Secondary | ICD-10-CM

## 2018-01-06 DIAGNOSIS — R05 Cough: Secondary | ICD-10-CM | POA: Diagnosis present

## 2018-01-06 DIAGNOSIS — Z79899 Other long term (current) drug therapy: Secondary | ICD-10-CM | POA: Diagnosis not present

## 2018-01-06 MED ORDER — ONDANSETRON HCL 4 MG/2ML IJ SOLN
4.0000 mg | Freq: Once | INTRAMUSCULAR | Status: DC
Start: 2018-01-06 — End: 2018-01-07

## 2018-01-06 MED ORDER — PREDNISONE 20 MG PO TABS
60.0000 mg | ORAL_TABLET | Freq: Once | ORAL | Status: AC
  Administered 2018-01-06: 60 mg via ORAL
  Filled 2018-01-06: qty 3

## 2018-01-06 MED ORDER — HYDROCOD POLST-CPM POLST ER 10-8 MG/5ML PO SUER
5.0000 mL | Freq: Once | ORAL | Status: AC
  Administered 2018-01-06: 5 mL via ORAL
  Filled 2018-01-06: qty 5

## 2018-01-06 MED ORDER — LEVALBUTEROL HCL 1.25 MG/0.5ML IN NEBU
1.2500 mg | INHALATION_SOLUTION | Freq: Once | RESPIRATORY_TRACT | Status: AC
  Administered 2018-01-06: 1.25 mg via RESPIRATORY_TRACT
  Filled 2018-01-06: qty 0.5

## 2018-01-06 MED ORDER — ONDANSETRON 4 MG PO TBDP
4.0000 mg | ORAL_TABLET | Freq: Once | ORAL | Status: AC
  Administered 2018-01-06: 4 mg via ORAL
  Filled 2018-01-06: qty 1

## 2018-01-06 MED ORDER — AZITHROMYCIN 250 MG PO TABS
500.0000 mg | ORAL_TABLET | Freq: Once | ORAL | Status: AC
  Administered 2018-01-06: 500 mg via ORAL
  Filled 2018-01-06: qty 2

## 2018-01-06 NOTE — ED Triage Notes (Signed)
Pt complains of cough, emesis, fever for the past 6-7 weeks. Pt was seen yesterday at [facility] but states he feels he is getting worse.

## 2018-01-06 NOTE — ED Provider Notes (Signed)
[facility] DEPT Provider Note   CSN: [id number] Arrival date & time: 01/06/18  1745     History   Chief Complaint Chief Complaint  Patient presents with  . Cough  . Emesis    HPI Tyrone Newton is a 72 y.o. male.  Patient presents with complaint of persistent cough for the past 7 weeks. He was seen yesterday at [facility] and had an evaluation that included labs, CXR and CT chest w/CM. Per review of results the patient's CT chest showed an area of calcified pleural plaques c/w previous asbestos exposure, mesothelioma considered a possibility. He was given 2 nebulizer treatments with Albuterol and an inhaler to use at home, which he reports makes the cough worse and leads to painful post-tussive emesis. He has been using OTC cough medications like Delsym, as well as a prescription codeinated cough medication that are not helping. He has had similar prolonged episodes of cough in the past. He has had prednisone with past episodes of similar cough but states this did not provide anything more than temporary relief. He returns to the ED today for persistent cough as well as the development of fever today.    The history is provided by the patient. No language interpreter was used.  Cough  Associated symptoms include chest pain (associated with coughing) and chills. Pertinent negatives include no shortness of breath.  Emesis   Associated symptoms include chills, cough and a fever. Pertinent negatives include no abdominal pain and no diarrhea.    Past Medical History:  Diagnosis Date  . Cancer ([place])   . GERD (gastroesophageal reflux disease)   . Gout     There are no active problems to display for this patient.   Past Surgical History:  Procedure Laterality Date  . CHOLECYSTECTOMY    . PROSTATE SURGERY     due to cancer        Home Medications    Prior to Admission medications   Medication Sig Start Date End Date Taking? Authorizing Provider    acetaminophen (TYLENOL) 500 MG tablet Take 500 mg by mouth every 6 (six) hours as needed (for muscle soreness or pain).   Yes [provider]  albuterol (PROVENTIL HFA;VENTOLIN HFA) 108 (90 Base) MCG/ACT inhaler Inhale 2 puffs into the lungs every 6 (six) hours as needed for wheezing or shortness of breath.   Yes [provider]  allopurinol (ZYLOPRIM) 300 MG tablet Take 150 mg by mouth daily.   Yes [provider]  benzonatate (TESSALON) 100 MG capsule Take 100 mg by mouth 3 (three) times daily as needed for cough.   Yes [provider]  docusate sodium (COLACE) 100 MG capsule Take 100 mg by mouth daily.   Yes [provider]  fexofenadine (ALLEGRA) 180 MG tablet Take 180 mg by mouth daily after breakfast.   Yes [provider]  guaiFENesin-codeine (ROBITUSSIN AC) 100-10 MG/5ML syrup Take 5 mLs by mouth 3 (three) times daily as needed for cough.   Yes [provider]  montelukast (SINGULAIR) 10 MG tablet Take 10 mg by mouth daily.   Yes [provider]  omeprazole (PRILOSEC) 20 MG capsule Take 20 mg by mouth daily.   Yes [provider]    Family History No family history on file.  Social History Social History   Tobacco Use  . Smoking status: Never Smoker  . Smokeless tobacco: Never Used  Substance Use Topics  . Alcohol use: No  . Drug  use: No     Allergies   Patient has no known allergies.   Review of Systems Review of Systems  Constitutional: Positive for chills and fever.  HENT: Negative.   Respiratory: Positive for cough. Negative for shortness of breath.   Cardiovascular: Positive for chest pain (associated with coughing).  Gastrointestinal: Positive for nausea and vomiting. Negative for abdominal pain and diarrhea.  Musculoskeletal: Negative.   Skin: Negative.   Neurological: Negative.  Negative for syncope and weakness.     Physical Exam Updated Vital Signs BP 138/78 (BP Location:  Left Arm)   Pulse 68   Temp (!) 100.6 F (38.1 C) (Oral)   Resp 20   SpO2 95%   Physical Exam  Constitutional: He is oriented to person, place, and time. He appears well-developed and well-nourished.  HENT:  Head: Normocephalic.  Neck: Normal range of motion. Neck supple.  Cardiovascular: Normal rate and regular rhythm.  Pulmonary/Chest: Effort normal. He has rales (LLL).  Actively coughing throughout exam. Dry, persistent cough.  Abdominal: Soft. Bowel sounds are normal. There is no tenderness. There is no rebound and no guarding.  Musculoskeletal: Normal range of motion.  Neurological: He is alert and oriented to person, place, and time.  Skin: Skin is warm and dry. No rash noted.  Psychiatric: He has a normal mood and affect.     ED Treatments / Results  Labs (all labs ordered are listed, but only abnormal results are displayed) Labs Reviewed - No data to display  EKG None  Radiology Dg Chest 2 View  Result Date: 01/05/2018 CLINICAL DATA:  Six week history of cough and chest congestion. Patient has undergone 2 cycles of antibiotic therapy and a cycle of corticosteroid therapy without relief. EXAM: CHEST - 2 VIEW COMPARISON:  01/25/2010. FINDINGS: Cardiomediastinal silhouette unremarkable, unchanged. Vertically oriented oval opacity laterally at the LEFT lung base on the PA image without correlate on the LATERAL image. Lungs otherwise clear. Normal bronchovascular markings. Normal pulmonary vascularity. No pleural effusions. Degenerative changes and DISH involving the thoracic spine. IMPRESSION: 1. Opacity projected over the LATERAL LEFT lung base on the PA image without correlate on the LATERAL image, most likely a calcified pleural plaque. Does the patient have a history of asbestos exposure? 2. No acute cardiopulmonary disease. Given the patient's personal history of prostate cancer, CT of the chest with contrast is recommended in further evaluation, which could be performed on  a non-emergent basis. Electronically Signed   By: Evangeline Dakin M.D.   On: 01/05/2018 09:37   Ct Chest W Contrast  Result Date: 01/05/2018 CLINICAL DATA:  Cough and congestion for 6 weeks. EXAM: CT CHEST WITH CONTRAST TECHNIQUE: Multidetector CT imaging of the chest was performed during intravenous contrast administration. CONTRAST:  9mL ISOVUE-370 IOPAMIDOL (ISOVUE-370) INJECTION 76% COMPARISON:  None. FINDINGS: Cardiovascular: The thoracic aorta measures 4.5 cm on series 2, image 81 in the ascending portion. The measurement 4.8 cm just above the heart on image 93. No aneurysmal dilatation in the arch or descending portions of the thoracic aorta. No dissection. No significant atherosclerotic change. The main pulmonary artery is normal in caliber with no obvious central filling defects. The heart is normal. No coronary artery calcifications are noted. Mediastinum/Nodes: Small nodules in the thyroid require no follow-up. No adenopathy seen in the chest. No effusions. Lungs/Pleura: Central airways are normal. No pneumothorax. No suspicious infiltrates. A small nodular density on axial image 98 is noted to be flat in appearance along the right minor fissure based  on coronal images. This is of no significance. No other suspicious nodule seen within the lungs. No masses within the lungs. Numerous pleural plaques are identified, several of which are partially calcified. A more rounded nodular region is seen along the posterior left upper pleura measuring 18 by 8 mm. Upper Abdomen: Previous cholecystectomy.  Left renal cysts. Musculoskeletal: Degenerative changes in the thoracic spine. IMPRESSION: 1. 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 Most of the regions of pleural thickening are clearly plaques. There is a single 18 mm more nodular region of pleural thickening posteriorly on the left which could represent a nodular plaque. However, the patient is at  increased risk for mesothelioma. Recommend consultation with a pulmonologist. This more nodular region of thickening could be further assessed with close follow-up versus a PET-CT. 2. Thoracic aortic aneurysm measuring up to 4.8 cm. Electronically Signed   By: Dorise Bullion III M.D   On: 01/05/2018 11:57    Procedures Procedures (including critical care time)  Medications Ordered in ED Medications - No data to display   Initial Impression / Assessment and Plan / ED Course  I have reviewed the triage vital signs and the nursing notes.  Pertinent labs & imaging results that were available during my care of the patient were reviewed by me and considered in my medical decision making (see chart for details).     Patient presents for management of persistent cough causing post-tussive emesis. Cough for 7 weeks.   He is actively coughing. Doubt new bacterial infection. May be related to asbestosis and will need pulmonology referral for full consultation.   Patient given Xopenex and he states he feels he tolerates this without the same side effects as with Albuterol. Given Tussionex and prednisone as well. On re-evaluation the patient is sleeping and cough has resolved. He feels these medications have worked better than anything previous to this.   On recheck he is found to have a fever of 102. Treated with Tylenol. VSS. No hypoxia. Full work up yesterday including Chest CT w/CM - all negative for infection. Feel he can be discharged home and is strongly encouraged to be seen by Pulmonology.  Final Clinical Impressions(s) / ED Diagnoses   Final diagnoses:  None   1. cough  ED Discharge Orders    None       Charlann Lange, PA-C 01/07/18 0606    Davonna Belling, MD 01/09/18 1149

## 2018-01-07 MED ORDER — PREDNISONE 20 MG PO TABS: 60.0000 mg | ORAL_TABLET | Freq: Every day | ORAL | 0 refills | Status: DC

## 2018-01-07 MED ORDER — HYDROCOD POLST-CPM POLST ER 10-8 MG/5ML PO SUER: 5.0000 mL | Freq: Two times a day (BID) | ORAL | 0 refills | Status: DC

## 2018-01-07 MED ORDER — AZITHROMYCIN 250 MG PO TABS: 250.0000 mg | ORAL_TABLET | Freq: Every day | ORAL | 0 refills | Status: DC

## 2018-01-07 MED ORDER — LEVALBUTEROL TARTRATE 45 MCG/ACT IN AERO: 2.0000 | INHALATION_SPRAY | Freq: Four times a day (QID) | RESPIRATORY_TRACT | 0 refills | Status: DC | PRN

## 2018-01-07 MED ORDER — ACETAMINOPHEN 325 MG PO TABS
650.0000 mg | ORAL_TABLET | Freq: Once | ORAL | Status: AC
  Administered 2018-01-07: 650 mg via ORAL
  Filled 2018-01-07: qty 2

## 2018-01-07 NOTE — Discharge Instructions (Signed)
Take medications as prescribed. Return here if symptoms worsen. Follow up with pulmonology as recommended.

## 2018-09-23 IMAGING — CT CT CHEST W/ CM
2 of 3 series · 15 of 36 positions shown, 18 images · IV contrast (iopamidol)
Comparison: None.

CLINICAL DATA: Cough and congestion for 6 weeks.

EXAM:
CT CHEST WITH CONTRAST
TECHNIQUE: Multidetector CT imaging of the chest was performed during
intravenous contrast administration.
CONTRAST:  75mL [id number] IOPAMIDOL ([id number]) INJECTION 76%

[Series 2: axial st · axial · 0.83mm/px · z∈[-325,-9]mm · 12 of 186 slices shown, 15 images]
[im 14/186  mediastinal]
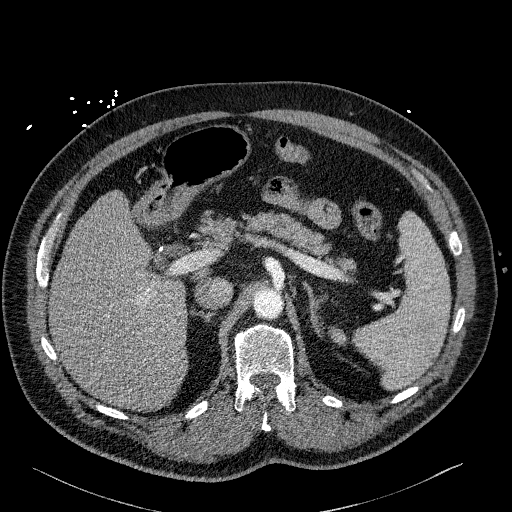
[im 14/186  lung]
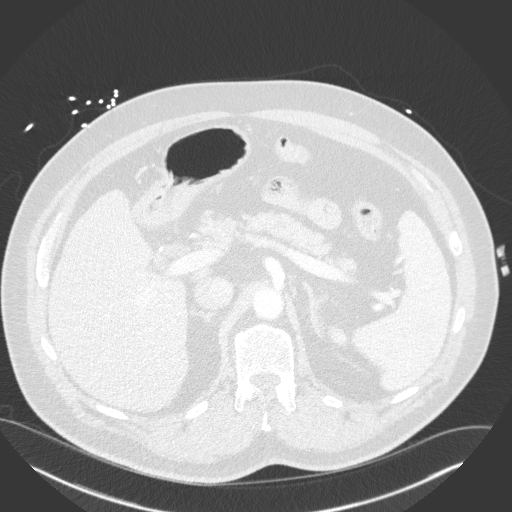
[im 28/186  lung]
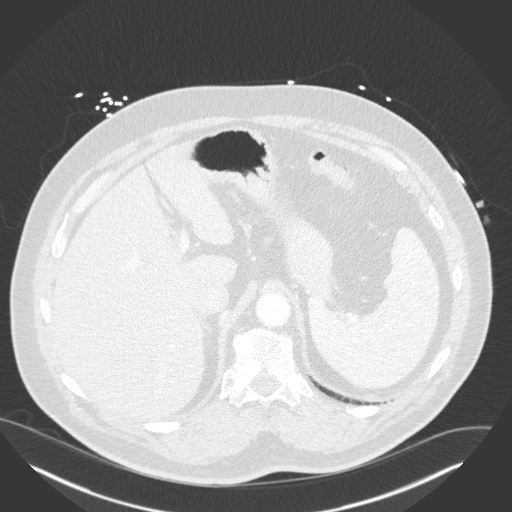
[im 42/186  lung]
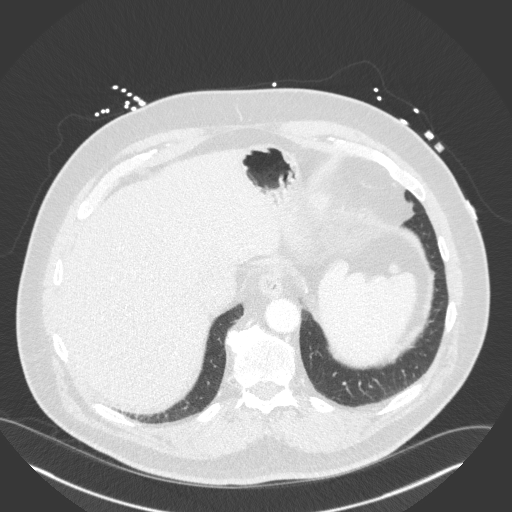
[im 55/186  lung]
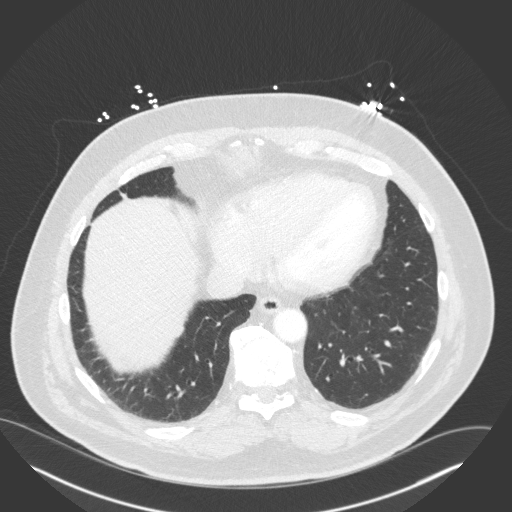
[im 69/186  mediastinal]
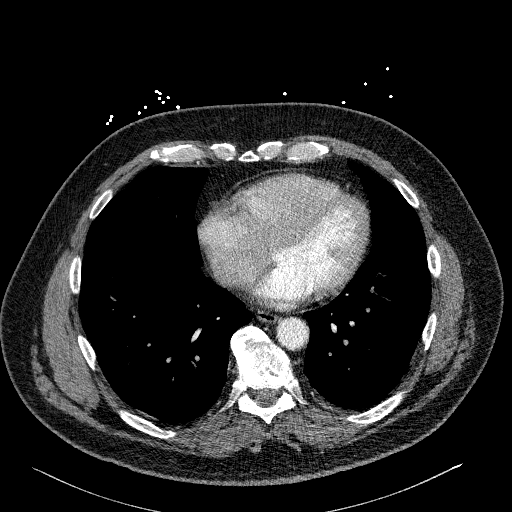
[im 69/186  lung]
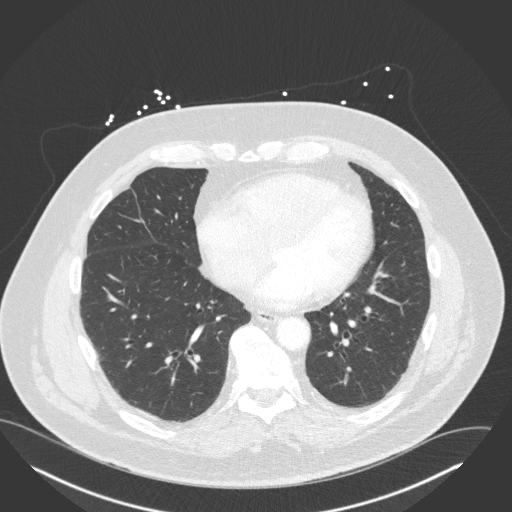
[im 83/186  lung]
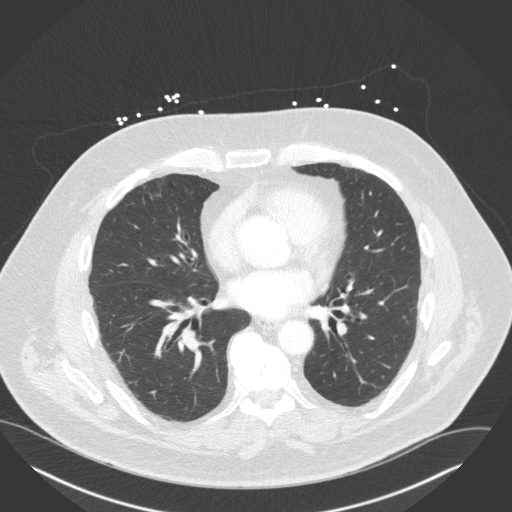
[im 103/186  lung]
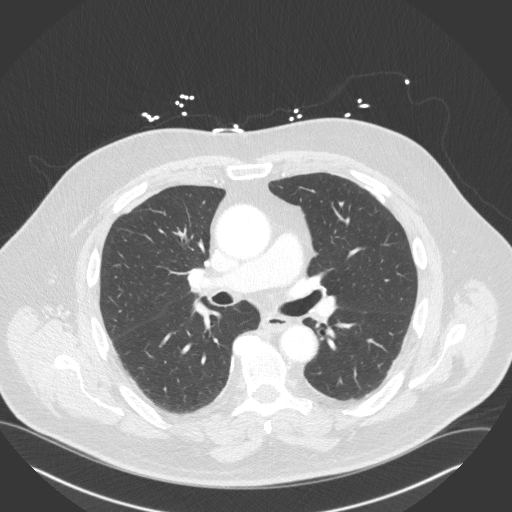
[im 117/186  lung]
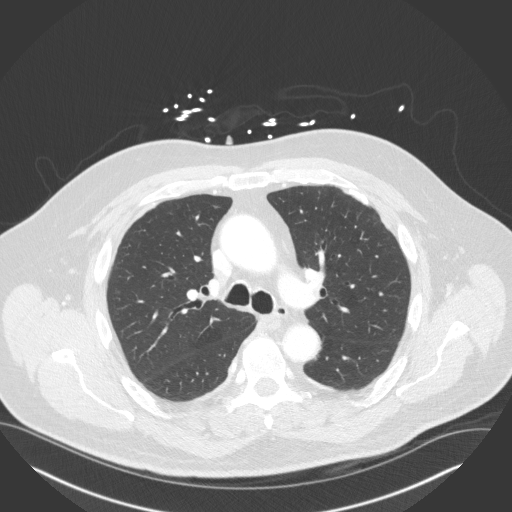
[im 131/186  mediastinal]
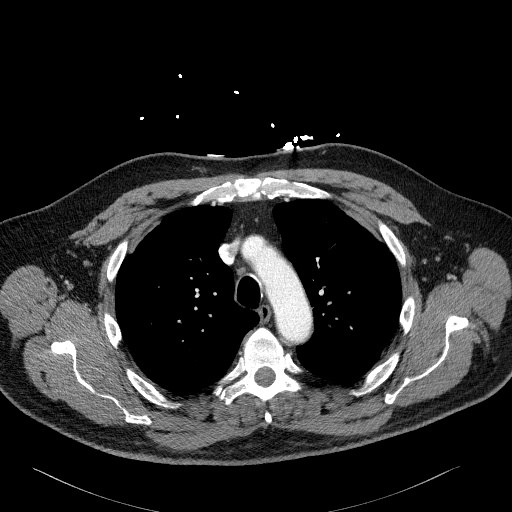
[im 131/186  lung]
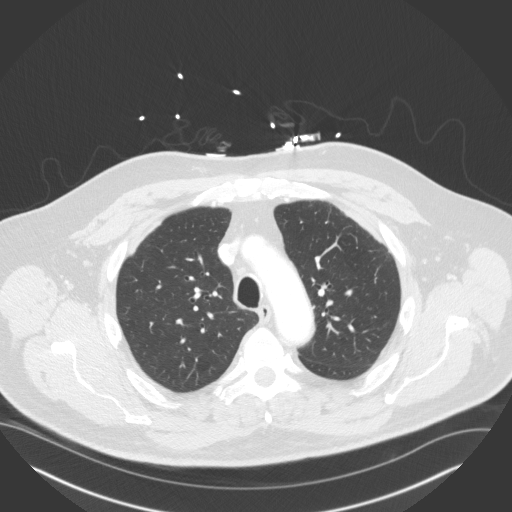
[im 144/186  lung]
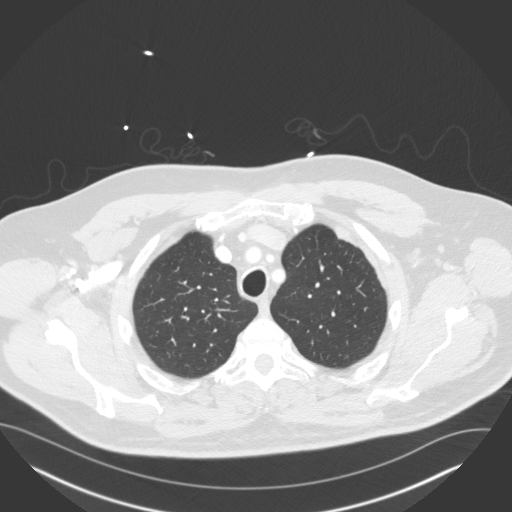
[im 158/186  lung]
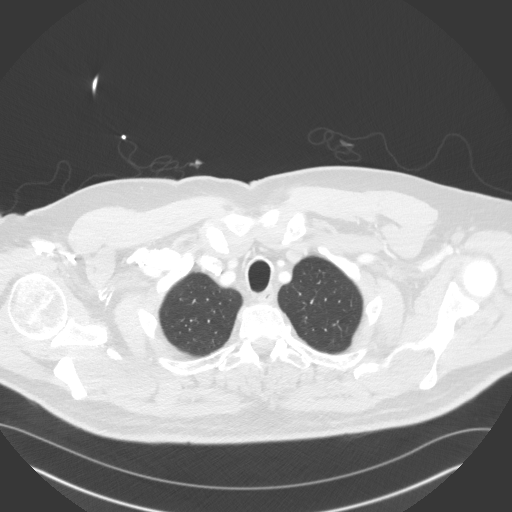
[im 172/186  lung]
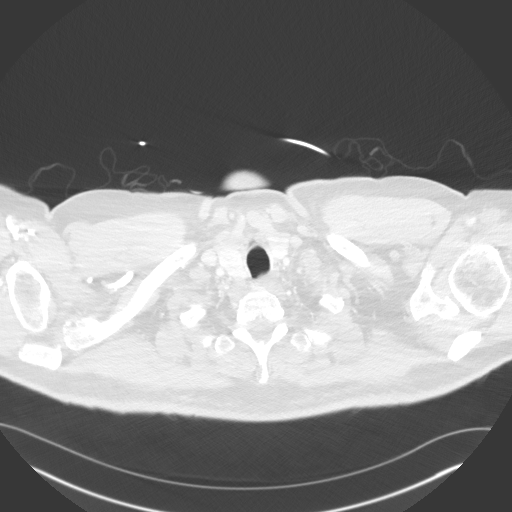

[Series 5: coronal · coronal · 0.75mm/px · 3 of 147 slices shown]
[im 30/147  lung]
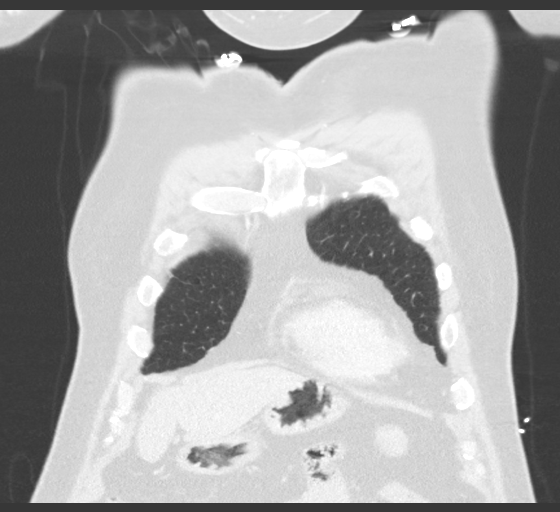
[im 59/147  lung]
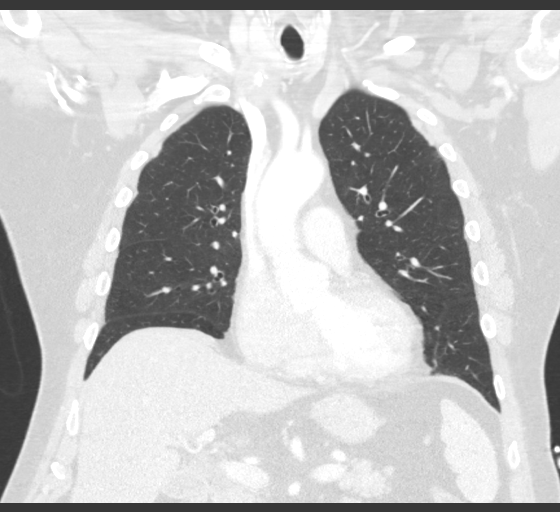
[im 88/147  lung]
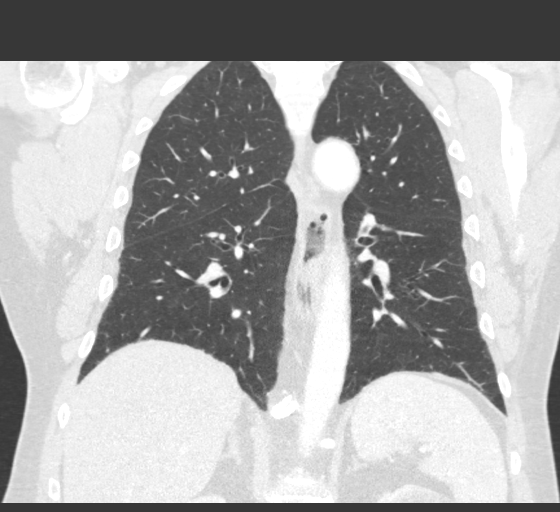

[15 of 36 positions shown; findings below may reference images not displayed]

FINDINGS: Cardiovascular: The thoracic aorta measures 4.5 cm on series 2,
image 81 in the ascending portion. The measurement 4.8 cm just above
the heart on image 93. No aneurysmal dilatation in the arch or
descending portions of the thoracic aorta. No dissection. No
significant atherosclerotic change. The main pulmonary artery is
normal in caliber with no obvious central filling defects. The heart
is normal. No coronary artery calcifications are noted.

Mediastinum/Nodes: Small nodules in the thyroid require no
follow-up. No adenopathy seen in the chest. No effusions.

Lungs/Pleura: Central airways are normal. No pneumothorax. No
suspicious infiltrates. A small nodular density on axial image 98 is
noted to be flat in appearance along the right minor fissure based
on coronal images. This is of no significance. No other suspicious
nodule seen within the lungs. No masses within the lungs.

Numerous pleural plaques are identified, several of which are
partially calcified. A more rounded nodular region is seen along the
posterior left upper pleura measuring 18 by 8 mm.

Upper Abdomen: Previous cholecystectomy.  Left renal cysts.

Musculoskeletal: Degenerative changes in the thoracic spine.
IMPRESSION: 1. The patient has partially calcified pleural plaques consistent
with previous asbestos exposure. Most of the regions of pleural
thickening are clearly plaques. There is a single 18 mm more nodular
region of pleural thickening posteriorly on the left which could
represent a nodular plaque. However, the patient is at increased
risk for mesothelioma. Recommend consultation with a pulmonologist.
This more nodular region of thickening could be further assessed
with close follow-up versus a PET-CT.
2. Thoracic aortic aneurysm measuring up to 4.8 cm.

## 2022-10-18 DIAGNOSIS — I441 Atrioventricular block, second degree: Secondary | ICD-10-CM | POA: Diagnosis present

## 2022-10-25 ENCOUNTER — Emergency Department (HOSPITAL_BASED_OUTPATIENT_CLINIC_OR_DEPARTMENT_OTHER): Payer: No Typology Code available for payment source

## 2022-10-25 ENCOUNTER — Emergency Department (HOSPITAL_BASED_OUTPATIENT_CLINIC_OR_DEPARTMENT_OTHER)
Admission: EM | Admit: 2022-10-25 | Discharge: 2022-10-25 | Disposition: A | Discharge status: Home / Self Care | Payer: No Typology Code available for payment source | Attending: Emergency Medicine | Admitting: Emergency Medicine

## 2022-10-25 ENCOUNTER — Encounter (HOSPITAL_BASED_OUTPATIENT_CLINIC_OR_DEPARTMENT_OTHER): Payer: Self-pay | Admitting: *Deleted

## 2022-10-25 ENCOUNTER — Other Ambulatory Visit: Payer: Self-pay

## 2022-10-25 DIAGNOSIS — Z7982 Long term (current) use of aspirin: Secondary | ICD-10-CM | POA: Diagnosis not present

## 2022-10-25 DIAGNOSIS — R1310 Dysphagia, unspecified: Secondary | ICD-10-CM | POA: Insufficient documentation

## 2022-10-25 DIAGNOSIS — R531 Weakness: Secondary | ICD-10-CM | POA: Diagnosis not present

## 2022-10-25 LAB — COMPREHENSIVE METABOLIC PANEL
ALT: 71 U/L — ABNORMAL HIGH (ref 0–44)
AST: 59 U/L — ABNORMAL HIGH (ref 15–41)
Albumin: 3.9 g/dL (ref 3.5–5.0)
Alkaline Phosphatase: 64 U/L (ref 38–126)
Anion gap: 8 (ref 5–15)
BUN: 27 mg/dL — ABNORMAL HIGH (ref 8–23)
CO2: 23 mmol/L (ref 22–32)
Calcium: 9.2 mg/dL (ref 8.9–10.3)
Chloride: 101 mmol/L (ref 98–111)
Creatinine, Ser: 1.23 mg/dL (ref 0.61–1.24)
GFR, Estimated: >60 mL/min (ref >60)
Glucose, Bld: 162 mg/dL — ABNORMAL HIGH (ref 70–99)
Potassium: 3.6 mmol/L (ref 3.5–5.1)
Sodium: 132 mmol/L — ABNORMAL LOW (ref 135–145)
Total Bilirubin: 1 mg/dL (ref 0.3–1.2)
Total Protein: 8.6 g/dL — ABNORMAL HIGH (ref 6.5–8.1)

## 2022-10-25 LAB — CBC
HCT: 37.4 % — ABNORMAL LOW (ref 39.0–52.0)
Hemoglobin: 13.1 g/dL (ref 13.0–17.0)
MCH: 29.4 pg (ref 26.0–34.0)
MCHC: 35 g/dL (ref 30.0–36.0)
MCV: 83.9 fL (ref 80.0–100.0)
Platelets: 180 10*3/uL (ref 150–400)
RBC: 4.46 MIL/uL (ref 4.22–5.81)
RDW: 12.8 % (ref 11.5–15.5)
WBC: 4.7 10*3/uL (ref 4.0–10.5)
nRBC: 0 % (ref 0.0–0.2)

## 2022-10-25 LAB — MAGNESIUM: Magnesium: 1.9 mg/dL (ref 1.7–2.4)

## 2022-10-25 MED ORDER — SODIUM CHLORIDE 0.9 % IV BOLUS
1000.0000 mL | Freq: Once | INTRAVENOUS | Status: AC
  Administered 2022-10-25: 1000 mL via INTRAVENOUS

## 2022-10-25 NOTE — ED Triage Notes (Signed)
Presents with Shortness of Breath, onset approx 1 week ago, has felt weak at times. Was informed by primary MD that he was being evaluated for Myasthenia Gravis. Has difficulty swallowing at times, causing a decrease in PO intake

## 2022-10-25 NOTE — Progress Notes (Signed)
NIF and VC done due to patient history. NIF -30, VC 2.5L. Good effort. No distress noted

## 2022-10-25 NOTE — Discharge Instructions (Signed)
Please follow-up with a neurologist on Monday.  If your swallowing symptoms get worse, or your breathing gets more labored or difficult, please return to the hospital immediately.

## 2022-10-25 NOTE — ED Notes (Signed)
States due to his "respiratory problems" is sleeping in recliner at night, cannot sleep on his back, intermittently will lay on his side, has noted his ankles being swollen at times.

## 2022-10-25 NOTE — ED Provider Notes (Signed)
[facility] Provider Note   CSN: [id number] Arrival date & time: 10/25/22  1051     History  No chief complaint on file.   Tyrone Newton is a 77 y.o. male with recent hospitalization for myasthenia gravis crisis, acute right cerebellar infarct, second-degree Mobitz 2 heart block, presented to the emergency department the company of his wife for concern for dysphagia.  The patient reports that he had waxing waning symptoms while he was in the hospital at [facility]) last week, being treated with IVIG for MS flareup.  But he was able to swallow fairly well in the hospital when he was leaving.  For the past 2 days since getting home he said difficulty with swallowing.  He is trying to grind up his pills into smaller pieces but having difficulty even with his saliva.  He feels he is able to drink water.  He also says subjective shortness of breath at home.  He has a follow-up appointment with neurology on Monday but came in for evaluation at the behest of the [facility] when he called him today.  Pt also treated for UTI at [facility] last week  Per [facility] discharge summary regarding dysphagia:  "MG associated dysphagia and weakness With MG, patient had dysphagia. SLP evaluated the patient multiple times and recommended regular diet with thin liquids despite patients subjective dysphagia. This information was verbally relayed to SLP at the [place]. Patient cautiously advanced his diet as he showed great understanding that weakness (including swallowing) was progressive with use. PT and OT also evaluated the patient, and recommended OPPT and OPOT. "  HPI     Home Medications Prior to Admission medications   Medication Sig Start Date End Date Taking? Authorizing Provider  aspirin 81 MG chewable tablet Chew 81 mg by mouth daily.   Yes [provider]  calcium carbonate (TUMS - DOSED IN MG ELEMENTAL CALCIUM) 500 MG chewable tablet Chew 1  tablet by mouth daily.   Yes [provider]  losartan (COZAAR) 25 MG tablet Take 25 mg by mouth daily.   Yes [provider]  metFORMIN (GLUCOPHAGE) 500 MG tablet Take 500 mg by mouth 2 (two) times daily with a meal.   Yes [provider]  acetaminophen (TYLENOL) 500 MG tablet Take 500 mg by mouth every 6 (six) hours as needed (for muscle soreness or pain).    [provider]  albuterol (PROVENTIL HFA;VENTOLIN HFA) 108 (90 Base) MCG/ACT inhaler Inhale 2 puffs into the lungs every 6 (six) hours as needed for wheezing or shortness of breath.    [provider]  allopurinol (ZYLOPRIM) 300 MG tablet Take 150 mg by mouth daily.    [provider]  azithromycin (ZITHROMAX Z-PAK) 250 MG tablet Take 1 tablet (250 mg total) by mouth daily. 01/07/18   Charlann Lange, PA-C  benzonatate (TESSALON) 100 MG capsule Take 100 mg by mouth 3 (three) times daily as needed for cough.    [provider]  chlorpheniramine-HYDROcodone (TUSSIONEX PENNKINETIC ER) 10-8 MG/5ML SUER Take 5 mLs by mouth 2 (two) times daily. 01/07/18   Charlann Lange, PA-C  docusate sodium (COLACE) 100 MG capsule Take 100 mg by mouth daily.    [provider]  fexofenadine (ALLEGRA) 180 MG tablet Take 180 mg by mouth daily after breakfast.    [provider]  guaiFENesin-codeine (ROBITUSSIN AC) 100-10 MG/5ML syrup Take 5 mLs by mouth 3 (three) times daily as needed  for cough.    [provider]  levalbuterol Penne Lash HFA) 45 MCG/ACT inhaler Inhale 2 puffs into the lungs every 6 (six) hours as needed for wheezing. 01/07/18   Charlann Lange, PA-C  montelukast (SINGULAIR) 10 MG tablet Take 10 mg by mouth daily.    [provider]  omeprazole (PRILOSEC) 20 MG capsule Take 20 mg by mouth daily.    [provider]  predniSONE (DELTASONE) 20 MG tablet Take 3 tablets (60 mg total) by mouth daily. 01/07/18   Charlann Lange, PA-C      Allergies    Patient  has no known allergies.    Review of Systems   Review of Systems  Physical Exam Updated Vital Signs BP 139/68   Pulse 76   Temp 98.1 F (36.7 C) (Oral)   Resp 18   Ht '6\' 3"'$  (1.905 m)   Wt 102.9 kg   SpO2 96%   BMI 28.35 kg/m  Physical Exam Constitutional:      General: He is not in acute distress. HENT:     Head: Normocephalic and atraumatic.     Comments: Swallowing saliva Eyes:     Conjunctiva/sclera: Conjunctivae normal.     Pupils: Pupils are equal, round, and reactive to light.  Cardiovascular:     Rate and Rhythm: Normal rate and regular rhythm.  Pulmonary:     Effort: Pulmonary effort is normal. No respiratory distress.  Abdominal:     General: There is no distension.     Tenderness: There is no abdominal tenderness.  Skin:    General: Skin is warm and dry.  Neurological:     General: No focal deficit present.     Mental Status: He is alert and oriented to person, place, and time. Mental status is at baseline.     Comments: No ptosis  Psychiatric:        Mood and Affect: Mood normal.        Behavior: Behavior normal.     ED Results / Procedures / Treatments   Labs (all labs ordered are listed, but only abnormal results are displayed) Labs Reviewed  COMPREHENSIVE METABOLIC PANEL - Abnormal; Notable for the following components:      Result Value   Sodium 132 (*)    Glucose, Bld 162 (*)    BUN 27 (*)    Total Protein 8.6 (*)    AST 59 (*)    ALT 71 (*)    All other components within normal limits  CBC - Abnormal; Notable for the following components:   HCT 37.4 (*)    All other components within normal limits  MAGNESIUM    EKG EKG Interpretation  Date/Time:  Thursday October 25 2022 11:07:06 EST Ventricular Rate:  78 PR Interval:  240 QRS Duration: 95 QT Interval:  382 QTC Calculation: 436 R Axis:   58 Text Interpretation: Sinus rhythm with prolonged PR interval consistent with 1st degree heart block, no STEMI Confirmed by Octaviano Glow  [phone]) on 10/25/2022 11:12:00 AM  Radiology DG Chest Portable 1 View  Result Date: 10/25/2022 CLINICAL DATA:  aspiration evaluation EXAM: PORTABLE CHEST - 1 VIEW COMPARISON:  10/17/2022 FINDINGS: Cardiac silhouette is unremarkable. No pneumothorax or pleural effusion. The lungs are clear. The visualized skeletal structures are unremarkable. IMPRESSION: No acute cardiopulmonary process. Electronically Signed   By: Sammie Bench M.D.   On: 10/25/2022 12:40    Procedures Procedures    Medications Ordered in ED Medications  sodium chloride 0.9 %  bolus 1,000 mL (0 mLs Intravenous Stopped 10/25/22 1346)    ED Course/ Medical Decision Making/ A&P Clinical Course as of 10/25/22 1507  Thu Oct 25, 2022  1152 NIF  -30 [MT]  1249 Spoke to [facility] transfer center awaiting callback from neurologist [MT]  1302 Dr Concha Pyo neurologist [MT]  [phone] I spoke to Dr Concha Pyo from [facility] neurology other transfer center had recommended transfer for evaluation at [facility].  Subsequently when I went to speak to the patient and his wife they have opted instead to go home.  They feel he can manage with his crushed pills at home and with food and fluids.  He is absolutely not wanting to stay in the hospital or be transferred back to the hospital.  He understands that if his symptoms worsens he needs to return to the hospital institution. [MT]    Clinical Course User Index [MT] Lauralie Blacksher, Carola Rhine, MD                             Medical Decision Making Amount and/or Complexity of Data Reviewed Labs: ordered. Radiology: ordered.   This patient presents to the ED with concern for dysphagia, shortness of breath. This involves an extensive number of treatment options, and is a complaint that carries with it a high risk of complications and morbidity.  The differential diagnosis includes MG most likely vs other  Co-morbidities that complicate the patient evaluation: History of myasthenia gravis at high risk of  exacerbation  Additional history obtained from patient's wife  External records from outside source obtained and reviewed including hospital course and discharge summary from [facility] earlier this week at which point he was treated for myasthenia gravis crisis with IVIG.  Cardiology was also consulted for the patient's second-degree Mobitz type II block and recommended outpatient management for eventual PPM once stabilized.  I ordered and personally interpreted labs.  The pertinent results include: No emergent findings  I ordered imaging studies including x-ray of the chest I independently visualized and interpreted imaging which showed no evidence of aspiration I agree with the radiologist interpretation  The patient was maintained on a cardiac monitor.  I personally viewed and interpreted the cardiac monitored which showed an underlying rhythm of: Regular rate  Per my interpretation the patient's ECG shows first-degree versus Mobitz second-degree heart block, no acute ischemic changes, rate within normal limits  I ordered medication including IV fluids for hydration  I have reviewed the patients home medicines and have made adjustments as needed   I spoke to the on-call neurologist at [facility] through the transfer center please see ED course After the interventions noted above, I reevaluated the patient and found that they have: stayed the same  Dispostion:  After consideration of the diagnostic results and the patients response to treatment, I feel that the patent would benefit from close outpatient neurology follow-up.  Ultimately I discussed with the patient and his wife the option of transfer and admission back to [facility] given his worsening dysphagia versus close outpatient follow-up.  The patient was adamant about going home.  I think this is reasonable in this setting.  I do not have concern for acute impending respiratory failure.  He will  manage with his arrhythmia as an outpatient, as this is not the cause of any of his emergent concerns, and was addressed while he was recently in the hospital  Final Clinical Impression(s) / ED Diagnoses Final diagnoses:  Dysphagia, unspecified type    Rx / DC Orders ED Discharge Orders     None         Saavi Mceachron, Carola Rhine, MD 10/25/22 [phone]

## 2022-10-25 NOTE — ED Notes (Signed)
ED Provider at bedside. 

## 2022-10-25 NOTE — ED Notes (Signed)
Pt stable at time of discharge. Pt denies any SOB or breathing problems at this time. No acute distress noted. Pt and family member verbalized understanding of discharge instructions as discussed.

## 2023-03-17 ENCOUNTER — Emergency Department (HOSPITAL_BASED_OUTPATIENT_CLINIC_OR_DEPARTMENT_OTHER): Payer: No Typology Code available for payment source

## 2023-03-17 ENCOUNTER — Encounter (HOSPITAL_BASED_OUTPATIENT_CLINIC_OR_DEPARTMENT_OTHER): Payer: Self-pay | Admitting: Emergency Medicine

## 2023-03-17 ENCOUNTER — Other Ambulatory Visit: Payer: Self-pay

## 2023-03-17 ENCOUNTER — Emergency Department (HOSPITAL_BASED_OUTPATIENT_CLINIC_OR_DEPARTMENT_OTHER)
Admission: EM | Admit: 2023-03-17 | Discharge: 2023-03-17 | Disposition: A | Discharge status: Home / Self Care | Payer: No Typology Code available for payment source | Attending: Emergency Medicine | Admitting: Emergency Medicine

## 2023-03-17 DIAGNOSIS — R059 Cough, unspecified: Secondary | ICD-10-CM | POA: Insufficient documentation

## 2023-03-17 DIAGNOSIS — J069 Acute upper respiratory infection, unspecified: Secondary | ICD-10-CM

## 2023-03-17 DIAGNOSIS — Z1152 Encounter for screening for COVID-19: Secondary | ICD-10-CM | POA: Insufficient documentation

## 2023-03-17 DIAGNOSIS — R5383 Other fatigue: Secondary | ICD-10-CM | POA: Insufficient documentation

## 2023-03-17 DIAGNOSIS — R6883 Chills (without fever): Secondary | ICD-10-CM | POA: Insufficient documentation

## 2023-03-17 DIAGNOSIS — I441 Atrioventricular block, second degree: Secondary | ICD-10-CM

## 2023-03-17 HISTORY — DX: Myasthenia gravis without (acute) exacerbation: G70.00

## 2023-03-17 LAB — I-STAT VENOUS BLOOD GAS, ED
Acid-Base Excess: 2 mmol/L (ref 0.0–2.0)
Bicarbonate: 26.2 mmol/L (ref 20.0–28.0)
Calcium, Ion: 1.19 mmol/L (ref 1.15–1.40)
HCT: 31 % — ABNORMAL LOW (ref 39.0–52.0)
Hemoglobin: 10.5 g/dL — ABNORMAL LOW (ref 13.0–17.0)
O2 Saturation: 58 %
Patient temperature: 100.1
Potassium: 3.6 mmol/L (ref 3.5–5.1)
Sodium: 137 mmol/L (ref 135–145)
TCO2: 27 mmol/L (ref 22–32)
pCO2, Ven: 40.2 mmHg — ABNORMAL LOW (ref 44–60)
pH, Ven: 7.425 (ref 7.25–7.43)
pO2, Ven: 31 mmHg — CL (ref 32–45)

## 2023-03-17 LAB — BASIC METABOLIC PANEL
Anion gap: 13 (ref 5–15)
BUN: 22 mg/dL (ref 8–23)
CO2: 22 mmol/L (ref 22–32)
Calcium: 9 mg/dL (ref 8.9–10.3)
Chloride: 102 mmol/L (ref 98–111)
Creatinine, Ser: 1.04 mg/dL (ref 0.61–1.24)
GFR, Estimated: >60 mL/min (ref >60)
Glucose, Bld: 228 mg/dL — ABNORMAL HIGH (ref 70–99)
Potassium: 3.5 mmol/L (ref 3.5–5.1)
Sodium: 137 mmol/L (ref 135–145)

## 2023-03-17 LAB — CBC WITH DIFFERENTIAL/PLATELET
Abs Immature Granulocytes: 0.07 10*3/uL (ref 0.00–0.07)
Basophils Absolute: 0 10*3/uL (ref 0.0–0.1)
Basophils Relative: 0 %
Eosinophils Absolute: 0 10*3/uL (ref 0.0–0.5)
Eosinophils Relative: 0 %
HCT: 34.2 % — ABNORMAL LOW (ref 39.0–52.0)
Hemoglobin: 11.6 g/dL — ABNORMAL LOW (ref 13.0–17.0)
Immature Granulocytes: 1 %
Lymphocytes Relative: 10 %
Lymphs Abs: 0.8 10*3/uL (ref 0.7–4.0)
MCH: 30.4 pg (ref 26.0–34.0)
MCHC: 33.9 g/dL (ref 30.0–36.0)
MCV: 89.8 fL (ref 80.0–100.0)
Monocytes Absolute: 0.7 10*3/uL (ref 0.1–1.0)
Monocytes Relative: 8 %
Neutro Abs: 6.4 10*3/uL (ref 1.7–7.7)
Neutrophils Relative %: 81 %
Platelets: 166 10*3/uL (ref 150–400)
RBC: 3.81 MIL/uL — ABNORMAL LOW (ref 4.22–5.81)
RDW: 14 % (ref 11.5–15.5)
WBC: 8 10*3/uL (ref 4.0–10.5)
nRBC: 0 % (ref 0.0–0.2)

## 2023-03-17 LAB — RESP PANEL BY RT-PCR (RSV, FLU A&B, COVID)  RVPGX2
Influenza A by PCR: NEGATIVE
Influenza B by PCR: NEGATIVE
Resp Syncytial Virus by PCR: NEGATIVE
SARS Coronavirus 2 by RT PCR: NEGATIVE

## 2023-03-17 LAB — SARS CORONAVIRUS 2 BY RT PCR: SARS Coronavirus 2 by RT PCR: NEGATIVE

## 2023-03-17 LAB — BRAIN NATRIURETIC PEPTIDE: B Natriuretic Peptide: 208.1 pg/mL — ABNORMAL HIGH (ref 0.0–100.0)

## 2023-03-17 LAB — LACTIC ACID, PLASMA: Lactic Acid, Venous: 1.9 mmol/L (ref 0.5–1.9)

## 2023-03-17 MED ORDER — BENZONATATE 100 MG PO CAPS
100.0000 mg | ORAL_CAPSULE | Freq: Once | ORAL | Status: AC
  Administered 2023-03-17: 100 mg via ORAL
  Filled 2023-03-17: qty 1

## 2023-03-17 MED ORDER — SODIUM CHLORIDE 0.9 % IV BOLUS (SEPSIS)
500.0000 mL | Freq: Once | INTRAVENOUS | Status: AC
  Administered 2023-03-17: 500 mL via INTRAVENOUS

## 2023-03-17 MED ORDER — SODIUM CHLORIDE 0.9 % IV BOLUS (SEPSIS)
1000.0000 mL | Freq: Once | INTRAVENOUS | Status: DC
  Administered 2023-03-17: 1000 mL via INTRAVENOUS

## 2023-03-17 MED ORDER — ACETAMINOPHEN 500 MG PO TABS
1000.0000 mg | ORAL_TABLET | Freq: Once | ORAL | Status: AC
  Administered 2023-03-17: 1000 mg via ORAL
  Filled 2023-03-17: qty 2

## 2023-03-17 NOTE — ED Provider Notes (Signed)
Blood pressure 104/60, pulse (!) 103, temperature 98.8 F (37.1 C), temperature source Oral, resp. rate 16, height 6\' 3"  (1.905 m), weight 96.2 kg, SpO2 95 %.  Assuming care from Dr. Elpidio Anis.  In short, Tyrone Newton is a 77 y.o. male with a chief complaint of Cough .  Refer to the original H&P for additional details.  The current plan of care is to obtain NIF and follow additional labs.  07:35 AM NIF > -60 (machine max) and normal VFC.    EKG Interpretation  Date/Time:  Sunday March 17 2023 07:36:05 EDT Ventricular Rate:  95 PR Interval:  216 QRS Duration: 82 QT Interval:  362 QTC Calculation: 456 R Axis:   16 Text Interpretation: Second degree AV block, Mobitz II Low voltage, precordial leads Abnormal R-wave progression, early transition Confirmed by Alona Bene [phone]) on 03/17/2023 8:19:37 AM       Had discussion with patient and wife at bedside.  He is overall feeling well.  He states feeling much better after IV fluids.  Do not suspect that he is in a myasthenic crisis clinically or by respiratory measurement.  No obvious pneumonia but will have him increase his Bactrim to daily dosing for the next 7 days and then back to the 3 times per week.  He has follow-up with the [place].  We also discussed his AV block.  I do not think this is causing him significant symptoms at this time and he has a known Mobitz block from the [place].  The patient will call his cardiologist in the morning for close follow-up. Considered admit but after shared decision making discussion and bedside, plan for d/c with close outpatient follow up.     Maia Plan, MD 03/17/23 [phone]

## 2023-03-17 NOTE — Discharge Instructions (Signed)
You were seen in the emergency room and with likely viral upper respiratory infection/bronchitis.  I would like for you to take your Bactrim daily for the next 7 days and then go back to the every other day option.  Please continue your inhalers at home and follow closely with your primary care doctor.  I would also like for you to reach out to your cardiologist.  It does appear that you remain in a heart block.  I would like for them to continue monitoring this as if this continues to develop you may ultimately require a pacemaker.   Return to the emergency department if you develop any shortness of breath, chest discomfort, lightheadedness/passing out, or other sudden/severe symptoms.

## 2023-03-17 NOTE — ED Notes (Signed)
Patient transported to X-ray 

## 2023-03-17 NOTE — ED Triage Notes (Signed)
Pt reports cough since Friday and shivering. No known fever. Oxygen saturation 96% on RA. Denies other sx.

## 2023-03-17 NOTE — ED Notes (Signed)
Good patient effort for both maneuvers. NIF -60 cm/H2O  FVC 1.1 L

## 2023-03-17 NOTE — ED Provider Notes (Signed)
[place] EMERGENCY DEPARTMENT AT [facility] [place] Provider Note   CSN: [id number] Arrival date & time: 03/17/23  4098     History  No chief complaint on file.   Tyrone Newton is a 77 y.o. male.  With PMH of myasthenia gravis, GERD, second-degree AV block Presenting with cough, fatigue and chills.  Patient's wife was recently ill with dry cough and viral symptoms.  Patient has been feeling unwell over the past few days.  He has been having persistent cough that has been dry in nature.  Denies any associated chest pain or shortness of breath.  Has been having chills with symptoms but no documented fevers at home.  He has felt general fatigue and has had less p.o. intake.  Has had some increased swelling of lower extremities but no history of heart failure.  Denies any orthopnea or PND.  Is on therapies for myasthenia gravis as well as 3 times weekly Bactrim.  He is currently on daily prednisone at 20 mg.  He is also on mestinon.  Denies any fainting episodes or chest pain.  Has had nausea but no vomiting.  No diarrhea reported.  Denies any worsening of myasthenia symptoms.  Has had intermittent ptosis especially when tired but no worsening of baseline symptoms.  HPI     Home Medications Prior to Admission medications   Medication Sig Start Date End Date Taking? Authorizing Provider  acetaminophen (TYLENOL) 500 MG tablet Take 500 mg by mouth every 6 (six) hours as needed (for muscle soreness or pain).    [provider]  albuterol (PROVENTIL HFA;VENTOLIN HFA) 108 (90 Base) MCG/ACT inhaler Inhale 2 puffs into the lungs every 6 (six) hours as needed for wheezing or shortness of breath.    [provider]  allopurinol (ZYLOPRIM) 300 MG tablet Take 150 mg by mouth daily.    [provider]  aspirin 81 MG chewable tablet Chew 81 mg by mouth daily.    [provider]  azithromycin (ZITHROMAX Z-PAK) 250 MG tablet Take 1 tablet (250 mg total) by mouth  daily. 01/07/18   Elpidio Anis, PA-C  benzonatate (TESSALON) 100 MG capsule Take 100 mg by mouth 3 (three) times daily as needed for cough.    [provider]  calcium carbonate (TUMS - DOSED IN MG ELEMENTAL CALCIUM) 500 MG chewable tablet Chew 1 tablet by mouth daily.    [provider]  chlorpheniramine-HYDROcodone (TUSSIONEX PENNKINETIC ER) 10-8 MG/5ML SUER Take 5 mLs by mouth 2 (two) times daily. 01/07/18   Elpidio Anis, PA-C  docusate sodium (COLACE) 100 MG capsule Take 100 mg by mouth daily.    [provider]  fexofenadine (ALLEGRA) 180 MG tablet Take 180 mg by mouth daily after breakfast.    [provider]  guaiFENesin-codeine (ROBITUSSIN AC) 100-10 MG/5ML syrup Take 5 mLs by mouth 3 (three) times daily as needed for cough.    [provider]  levalbuterol Pauline Aus HFA) 45 MCG/ACT inhaler Inhale 2 puffs into the lungs every 6 (six) hours as needed for wheezing. 01/07/18   Elpidio Anis, PA-C  losartan (COZAAR) 25 MG tablet Take 25 mg by mouth daily.    [provider]  metFORMIN (GLUCOPHAGE) 500 MG tablet Take 500 mg by mouth 2 (two) times daily with a meal.    [provider]  montelukast (SINGULAIR) 10 MG tablet Take 10 mg by mouth daily.    [provider]  omeprazole (PRILOSEC) 20 MG capsule Take 20 mg by  mouth daily.    [provider]  predniSONE (DELTASONE) 20 MG tablet Take 3 tablets (60 mg total) by mouth daily. 01/07/18   Elpidio Anis, PA-C      Allergies    Patient has no known allergies.    Review of Systems   Review of Systems  Physical Exam Updated Vital Signs There were no vitals taken for this visit. Physical Exam Constitutional: Alert and oriented.  Fatigued in appearance but no acute distress, nontoxic Eyes: Conjunctivae are normal.  Mild ptosis on the left ENT      Head: Normocephalic and atraumatic. Cardiovascular: S1, S2, irregular rhythm, regular rate, warm and  well-perfused Respiratory: Normal respiratory effort.  Mild tachypnea, O2 sat 95 on RA Gastrointestinal: Soft and nontender.  Musculoskeletal: Normal range of motion in all extremities. Trace equal nontender pretibial pitting edema bilateral lower extremities Neurologic: Normal speech and language.  Mild ptosis of the left.  Moving all 4 extremities equally.  Sensation grossly intact. Skin: Skin is warm, dry Psychiatric: Mood and affect are normal. Speech and behavior are normal.  ED Results / Procedures / Treatments   Labs (all labs ordered are listed, but only abnormal results are displayed) Labs Reviewed - No data to display  EKG None  Radiology No results found.  Procedures Procedures    Medications Ordered in ED Medications - No data to display  ED Course/ Medical Decision Making/ A&P                             Medical Decision Making Tyrone Newton is a 77 y.o. male.  With PMH of myasthenia gravis, GERD, second-degree AV block Presenting with cough, fatigue and chills.  Patient is symptoms very consistent with a viral URI or bronchitis.  He is not hypoxic but has mild tachypnea likely related to fever but has clear breath sounds no adventitious breath sounds, no wheezing or signs of reactive airway disease.  Chest x-ray obtained which I personally reviewed which showed no evidence of pneumonia or pleural effusions.  Followed up with chest CT which does show findings consistent with asbestosis but once again no acute findings.  COVID test was negative.  BNP up at 208 with some new lower extremity edema so only given 500 cc IV fluids.  Last EF 60 to 65%.  No known history of CHF.  Glucose 228 normal bicarbonate no DKA.  Mild anemia hemoglobin 11.6.  No leukocytosis white blood cell count is 8 within normal limits with no left shift.  Normal lactate, doubt sepsis.  With no findings of bacterial pneumonia, do not think patient requires current antibiotics.  He does not  supportive care.  Have been unable to obtain [facility] a [facility] as respiratory therapist was unsure where machine is located.  However, his VBG did not have any hypercarbia his pCO2 was actually decreased at 38 likely related to his tachypnea.  He is clearly not retaining.  EKG concerning for heart block, requested cardiology consult to review for possible third-degree.  Disposition pending reassessment, cardiology consult, ambulatory pulse ox.  Patient is at higher risk of decompensation due to underlying myasthenia gravis/neurologic disease. Signed out to dr Jacqulyn Bath.     Dr Jamey Ripa: 2. Continue prednisone 30 mg daily for the time being  3. Plan repeating Vyvgart infusions in range of 50-60 days from April 9 last infusion  4. Follow up with Dr. Jamey Ripa in 2 months  5. Continue strict  diabetic diet.  6. Continue omeprazole 20 mg daily  7. Take vitamin D 1000 units daily  Individuals with myasthenia gravis should avoid or use certain medications with caution, as they can worsen symptoms. Here are some types of medications to be mindful of:  Antibiotics: such as fluoroquinolones (e.g., ciprofloxacin), aminoglycosides (e.g., gentamicin), and macrolides (e.g., azithromycin)123. Beta-blockers: like propranolol1. Calcium channel blockers1. Muscle relaxants1. Steroids: high dose steroids should be used cautiously    Amount and/or Complexity of Data Reviewed Labs: ordered. Radiology: ordered.  Risk OTC drugs. Prescription drug management.      Final Clinical Impression(s) / ED Diagnoses Final diagnoses:  None    Rx / DC Orders ED Discharge Orders     None         Mardene Sayer, MD 03/17/23 [phone]

## 2023-03-22 LAB — CULTURE, BLOOD (ROUTINE X 2)
Culture: NO GROWTH
Culture: NO GROWTH
Special Requests: ADEQUATE
Special Requests: ADEQUATE

## 2023-10-03 DIAGNOSIS — Z8546 Personal history of malignant neoplasm of prostate: Secondary | ICD-10-CM

## 2023-10-03 DIAGNOSIS — Z9079 Acquired absence of other genital organ(s): Secondary | ICD-10-CM

## 2023-10-03 NOTE — ED Provider Notes (Signed)
 [facility] [place] [facility] Emergency Department Provider Note  This document was created using the aid of voice recognition Dragon dictation software  ____________________________________________  Time seen: 9:21 AM  I have reviewed the triage vital signs and the nursing notes.   History   Chief Complaint Fall   HPI  Tyrone Newton is a 78 y.o. male who presents to the emergency department with complaints of fever. Per Wife at bedside, Patient has been complaining of significant lower back pain, that is exacerbated with movement,  for the last 2-3 days. Patient has history of chronic back pain, but no surgeries. This morning while ambulating to the restroom, patient suddenly became too weak to stand and was helped to the ground by his wife. He did not fall or completely lost consciousness. Upon EMS arrival, Patient was prone on the ground and had a fever at 103F. Patient also endorses shortness of breath and increased urinary frequency. He denies any chest pain, numbness, or any other acute medical complaints at this time. Wife notes that patient has history of frequent UTIs. 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    On Vyvgart (efgartigimod alfa-fcab), pyridostigmine tab, Prednisone   Physical Exam   VITAL SIGNS:   ED Triage Vitals  Temp 10/03/23 0649 (!) 102.3 F (39.1 C)  Heart Rate 10/03/23 0649 96  Resp 10/03/23 0649 (!) 30  BP 10/03/23 0649 138/75  MAP (mmHg) 10/03/23 0649 94  SpO2 10/03/23 0649 91 %  O2 Device 10/03/23 0649 None (Room air)  O2 Flow Rate (L/min) 10/03/23 0657 2 L/min  Weight 10/03/23 0649 94.3 kg (208 lb)    Constitutional: Alert and oriented x 3 although the patient seems slightly encephalopathic. Well appearing and in no distress. Eyes: Conjunctivae are normal. ENT      Head: Normocephalic and atraumatic.      Neck: No stridor.  No nuchal rigidity, normal ROM of neck. Cardiovascular: Tachycardic and irregularly  irregular rhythm. Heart rate: 110 BPM. Blood pressure 138/75 mmHg. Respiratory: Increased work of breathing. Trace rales noted.  Oxygen  saturation 90% on room air. Gastrointestinal: Soft. Suprapubic abdominal tenderness to palpation. No CVA tenderness to percussion.  [employer] aritifical urinary sphincter in place since 08/2022 per bedside card. Musculoskeletal: Skin tear on the left elbow. No spinal tenderness to palpation. Petechiae to the left upper back.  No lower extremity pitting edema. Neurologic: Normal speech and language. No gross focal neurologic deficits are appreciated. Psychiatric: Mood and affect are normal.     EKG   According to my interpretation the ECG shows EKG time: 06:49 Interpretation time: 06:49 Rate: 97 BPM Rhythm: undetermined narrow complex irregular rhythm Axis: normal Intervals: Normal ST-T Waves: Normal ST-T Waves Comparison with Old: Yes - significant changes from prior EKG on 10/18/2022   Radiology   All X-rays, CTs, and MRIs interpreted by radiologist and interpretation reviewed by me.  Procedures   Procedure(s) performed: None.  Pertinent labs & imaging results that were available during my care of the patient were reviewed by me and considered in my medical decision making (see chart for details).  Total critical care time was 0 minutes. This was spent providing cardiovascular or respiratory resuscitation in a critically ill patient.    ED Clinical Impression   1. Fever, unspecified fever cause Active  2. COVID-19 Active  3. Urinary tract infection with hematuria, site unspecified Active  4. Hypoxia Active  5. Cardiac arrhythmia, unspecified cardiac arrhythmia type Active  6. Elevated bilirubin Active  7. Pleural disease Active  8. Thrombocytopenia (CMD) Active  9. Pneumonia due to infectious organism, unspecified laterality, unspecified part of lung Active    Medical Decision Making: Initial Impression, ED Course, Assessment  and Plan   Per my interpretation the bedside cardiac monitoring shows: Irregularly irregular rhythm at 80 BPM.  The following chart(s) was reviewed: Reviewed Discharge Summary from 10/22/2022 following admission for CVA. Found to have E.coli during this admission.  Medical Decision Making 78 year old male presents with fever. Differential diagnosis includes urinary tract infection, community acquired pneumonia, bacteremia, endocarditis, septic arthritis, osteomyelitis, epidural abscess, meningitis/encephalitis, upper respiratory tract infection, viral syndrome, seasonal influenza, COVID-19. Malignancy causes, autoimmune causes, and metabolic causes including hyperthyroidism, neuroleptic malignant syndrome, and serotonin syndrome considered as well. Diagnostic work up included CXR, labs, urinalysis, blood cultures. Most likely source of the fever considered UTI vs COVID-19 with pneumonia. The patient was given IV Zosyn and IV crystalloid here.  This should cover urinary tract infection and bacterial community-acquired pneumonia.  Given the hypoxia and the immunosuppression I have started dexamethasone and remdesivir for COVID-19.  Blood pressure is normal.  Lactic acid is normal.  I have discussed the results and plan of care with the patient's wife.  The patient does have some form of a cardiac arrhythmia unclear rhythm type.  There is seems to be P waves of different morphologies and the rhythm is irregular.  This will need cardiology to evaluate.  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  Bilirubin mildly elevated platelet count mildly low possibly secondary to sepsis or COVID-19.  There is possible pleural disease on chest x-ray that will need to be followed up as an outpatient possibly asbestos related.  Chest x-ray is showing pneumonia possibly bacterial community-acquired pneumonia versus COVID-19.  Urinalysis shows urinary tract infection with hematuria.  Hospitalist to admit.   Problems  Addressed: Cardiac arrhythmia, unspecified cardiac arrhythmia type: complicated acute illness or injury COVID-19: complicated acute illness or injury Elevated bilirubin: complicated acute illness or injury Fever, unspecified fever cause: complicated acute illness or injury Hypoxia: complicated acute illness or injury Pleural disease: complicated acute illness or injury Pneumonia due to infectious organism, unspecified laterality, unspecified part of lung: complicated acute illness or injury Thrombocytopenia (CMD): complicated acute illness or injury Urinary tract infection with hematuria, site unspecified: complicated acute illness or injury  Amount and/or Complexity of Data Reviewed Labs: ordered. Radiology: ordered. ECG/medicine tests: ordered.  Risk OTC drugs. Prescription drug management. Decision regarding hospitalization.    Clinical Complexity  Patient's presentation is most consistent with acute presentation with potential threat to life or bodily function.  Patient's Myasthenia gravis increases the complexity of managing their  presentation with fever.    Provider time spent in patient care today, inclusive of but not limited to clinical reassessment, review of diagnostic studies, and discharge preparation, was greater than 30 minutes.    ED Course as of 10/03/23 0921  Thu Oct 03, 2023  0803 Blood pressure is 102/58 mmHg. Heart rate is 62 BPM. [MK]    ED Course User Index [MK] Deatrice VEAR Bathe, Scribe    ____________________________ Ezra Attestation: This document serves as a record of services personally performed by Fairy Brain, MD. It was created on their behalf by Deatrice VEAR Bathe, Scribe, a trained medical scribe. The creation of this record is the provider's dictation and/or activities during the visit.   Electronically signed by: Deatrice VEAR Bathe, Scribe 10/03/2023 7:20 AM

## 2023-10-07 NOTE — Care Plan (Signed)
 Problem: PAIN - ADULT Goal: Verbalizes/displays adequate comfort level or baseline comfort level Description: INTERVENTIONS: 1. Encourage pt to monitor pain and request assistance 2. Assess pain using appropriate pain scale 3. Administer analgesics based on type and severity of pain and evaluate response 4. Implement non-pharmacological measures as appropriate and evaluate response 5. Consider cultural and social influences on pain and pain management 6. Notify LIP if interventions unsuccessful or patient reports new pain Outcome: Progressing   Problem: INFECTION - ADULT Goal: Absence of infection during hospitalization Description: INTERVENTIONS: 1. Assess and monitor for signs and symptoms of infection 2. Monitor lab/diagnostic results 3. Monitor all insertion sites i.e., indwelling lines, tubes and drains 4. Monitor endotracheal (as able) and nasal secretions for changes in amount and color 5. Institute appropriate cooling/warming therapies per order 6. Administer medications as ordered 7. Instruct and encourage patient and family to use good hand hygiene technique 8. Identify and instruct in appropriate isolation precautions for identified infection/condition Outcome: Progressing Goal: Absence of fever/infection during anticipated neutropenic period Description: INTERVENTIONS 1. Monitor WBC 2. Administer growth factors as ordered 3. Implement neutropenic guidelines as ordered Outcome: Progressing   Problem: Safety - Adult Goal: Absence of infection during hospitalization Description: INTERVENTIONS: 1. Assess and monitor for signs and symptoms of infection 2. Monitor lab/diagnostic results 3. Monitor all insertion sites i.e., indwelling lines, tubes and drains 4. Monitor endotracheal (as able) and nasal secretions for changes in amount and color 5. Institute appropriate cooling/warming therapies per order 6. Administer medications as ordered 7. Instruct and encourage  patient and family to use good hand hygiene technique 8. Identify and instruct in appropriate isolation precautions for identified infection/condition Outcome: Progressing   Problem: DISCHARGE PLANNING Goal: Discharge to home or other facility with appropriate resources Description: INTERVENTIONS: 1. Identify barriers to discharge w/pt and caregiver 2. Arrange for needed discharge resources and transportation as appropriate 3. Identify discharge learning needs (meds, wound care, etc) 4. Arrange for interpreters to assist at discharge as needed 5. Refer to Case Management Department for coordinating discharge planning if the patient needs post-hospital services based on physician order or complex needs related to functional status, cognitive ability or social support system Outcome: Progressing   Problem: Chronic Conditions and Co-Morbidities Goal: Patient's chronic conditions and co-morbidity symptoms are monitored and maintained or improved Description: INTERVENTIONS: 1. Monitor and assess patient's chronic conditions and comorbid symptoms for stability, deterioration, or improvement 2. Collaborate with multidisciplinary team to address chronic and comorbid conditions and prevent exacerbation or deterioration 3. Update acute care plan with appropriate goals if chronic or comorbid symptoms are exacerbated and prevent overall improvement and discharge Outcome: Progressing   Problem: Respiratory: Goal: Ability to maintain adequate ventilation will improve in 24 hours Description: Ability to maintain adequate ventilation will improve by discharge Outcome: Progressing Goal: Ability to maintain arterial blood gas levels will improve by discharge Description: Ability to maintain arterial blood gas levels within normal range will improve Outcome: Progressing Goal: Ability to maintain normal pulse oximetry readings will improve by discharge Description: Ability to maintain normal pulse oximetry  readings will improve by discharge Outcome: Progressing   Problem: Activity: Goal: Ability to tolerate increased activity will improve Description: Ability to tolerate increased activity will improve Outcome: Progressing   Problem: Health Behavior: Goal: MCB Ability to state ways to decrease the risk of falls will be met by discharge Description: Ability to state ways to decrease the risk of falls will improve by discharge Outcome: Progressing   Problem: Safety: Goal: Will  remain free from falls by discharge Description: Will remain free from falls by discharge Outcome: Progressing

## 2023-10-07 NOTE — Care Plan (Signed)
 Problem: PAIN - ADULT Goal: Verbalizes/displays adequate comfort level or baseline comfort level Description: INTERVENTIONS: 1. Encourage pt to monitor pain and request assistance 2. Assess pain using appropriate pain scale 3. Administer analgesics based on type and severity of pain and evaluate response 4. Implement non-pharmacological measures as appropriate and evaluate response 5. Consider cultural and social influences on pain and pain management 6. Notify LIP if interventions unsuccessful or patient reports new pain Outcome: Progressing   Problem: INFECTION - ADULT Goal: Absence of infection during hospitalization Description: INTERVENTIONS: 1. Assess and monitor for signs and symptoms of infection 2. Monitor lab/diagnostic results 3. Monitor all insertion sites i.e., indwelling lines, tubes and drains 4. Monitor endotracheal (as able) and nasal secretions for changes in amount and color 5. Institute appropriate cooling/warming therapies per order 6. Administer medications as ordered 7. Instruct and encourage patient and family to use good hand hygiene technique 8. Identify and instruct in appropriate isolation precautions for identified infection/condition Outcome: Progressing Goal: Absence of fever/infection during anticipated neutropenic period Description: INTERVENTIONS 1. Monitor WBC 2. Administer growth factors as ordered 3. Implement neutropenic guidelines as ordered Outcome: Progressing   Problem: Safety - Adult Goal: Free from fall injury Description: INTERVENTIONS: 1. Assess pt frequently for physical needs 2. Identify cognitive and physical deficits and behaviors that affect risk of falls. 3. Institute fall precautions as indicated by assessment. 4. Educate pt/family on patient safety including physical limitations 5. Instruct pt to call for assistance with activity based on assessment 6. Modify environment to reduce risk of injury 7. Consider OT/PT consult  to assist with strengthening/mobility Outcome: Progressing Goal: Absence of infection during hospitalization Description: INTERVENTIONS: 1. Assess and monitor for signs and symptoms of infection 2. Monitor lab/diagnostic results 3. Monitor all insertion sites i.e., indwelling lines, tubes and drains 4. Monitor endotracheal (as able) and nasal secretions for changes in amount and color 5. Institute appropriate cooling/warming therapies per order 6. Administer medications as ordered 7. Instruct and encourage patient and family to use good hand hygiene technique 8. Identify and instruct in appropriate isolation precautions for identified infection/condition Outcome: Progressing   Problem: DISCHARGE PLANNING Goal: Discharge to home or other facility with appropriate resources Description: INTERVENTIONS: 1. Identify barriers to discharge w/pt and caregiver 2. Arrange for needed discharge resources and transportation as appropriate 3. Identify discharge learning needs (meds, wound care, etc) 4. Arrange for interpreters to assist at discharge as needed 5. Refer to Case Management Department for coordinating discharge planning if the patient needs post-hospital services based on physician order or complex needs related to functional status, cognitive ability or social support system Outcome: Progressing   Problem: Chronic Conditions and Co-Morbidities Goal: Patient's chronic conditions and co-morbidity symptoms are monitored and maintained or improved Description: INTERVENTIONS: 1. Monitor and assess patient's chronic conditions and comorbid symptoms for stability, deterioration, or improvement 2. Collaborate with multidisciplinary team to address chronic and comorbid conditions and prevent exacerbation or deterioration 3. Update acute care plan with appropriate goals if chronic or comorbid symptoms are exacerbated and prevent overall improvement and discharge Outcome: Progressing   Problem:  Respiratory: Goal: Ability to maintain adequate ventilation will improve in 24 hours Description: Ability to maintain adequate ventilation will improve by discharge Outcome: Progressing Goal: Ability to maintain arterial blood gas levels will improve by discharge Description: Ability to maintain arterial blood gas levels within normal range will improve Outcome: Progressing Goal: Ability to maintain normal pulse oximetry readings will improve by discharge Description: Ability to maintain normal pulse oximetry  readings will improve by discharge Outcome: Progressing   Problem: Health Behavior: Goal: MCB Ability to state ways to decrease the risk of falls will be met by discharge Description: Ability to state ways to decrease the risk of falls will improve by discharge Outcome: Progressing   Problem: Activity: Goal: Ability to tolerate increased activity will improve Description: Ability to tolerate increased activity will improve Outcome: Progressing   Problem: Safety: Goal: Will remain free from falls by discharge Description: Will remain free from falls by discharge Outcome: Progressing   Problem: Compromised Skin Integrity Goal: Skin integrity is maintained or improved Description: Assess and monitor skin integrity. Identify patients at risk for skin breakdown on admission and per policy. Collaborate with interdisciplinary team and initiate plans and interventions as needed.    Outcome: Progressing Goal: Fluid and electrolyte balance are achieved/maintained Description: Assess and monitor vital signs (orthostatic vitals if applicable), fluid intake and output, urine color, labs, skin turgor, mucous membranes, jugular venous distention, edema, circumference of edematous extremities and abdominal girth, respiratory status, and mental status.  Monitor for signs and symptoms of hypovolemia (tachycardia, rapid breathing, decreased urine output, postural hypotension, confusion, syncope).   Monitor for signs and symptoms of hypervolemia (strong rapid pulse, shortness of breath, difficulty breathing lying down, crackles heard in lung fields, edema). Collaborate with interdisciplinary team and initiate plan and interventions as ordered. Outcome: Progressing Goal: Nutritional status is improving Description: Monitor and assess patient for malnutrition (ex- brittle hair, bruises, dry skin, pale skin and conjunctiva, muscle wasting, smooth red tongue, and disorientation). Collaborate with interdisciplinary team and initiate plan and interventions as ordered.  Monitor patient's weight and dietary intake as ordered or per policy. Utilize nutrition screening tool and intervene per policy. Determine patient's food preferences and provide high-protein, high-caloric foods as appropriate.  Outcome: Progressing   Problem: Urinary Incontinence Goal: Perineal skin integrity is maintained or improved Description: Assess genitourinary system, perineal skin, labs (urinalysis), and history of incontinence to include past management, aggravating, and alleviating factors.  Collaborate with interdisciplinary team and initiate plans and interventions as needed. Outcome: Progressing

## 2023-10-08 NOTE — Discharge Summary (Addendum)
 Hospital Medicine Discharge Summary   Demographics: Tyrone Newton  78 y.o. Mar 06, 1946 MRN: [id number]    Extended Emergency Contact Information Primary Emergency Contact: Gotcher,Sandy Mobile Phone: [phone] Relation: Spouse  Full Code  Admit Date: 10/03/2023                            Attending Physician: Jennings Graciela Keeler* Discharge Date: 10/08/2023  Primary Care Provider: Jayson Ricardo Ill, MD   [phone]  Consults during this admission: Consult Orders             IP CONSULT TO INFECTIOUS DISEASES       Specialty:  Infectious Diseases  Provider:  (Not yet assigned)      IP CONSULT TO HOSPITALIST       Provider:  (Not yet assigned)              Active & Resolved Diagnosis: Principal Problem (Resolved):   UTI (urinary tract infection) Active Problems:   Myasthenia gravis (HCC)   Mobitz type 2 second degree AV block   Type 2 diabetes mellitus with hyperglycemia, without long-term current use of insulin (CMD)   H/O prostate cancer   H/O prostatectomy   Bacteremia Resolved Problems:   COVID   Acute exacerbation of chronic low back pain   Disposition: Patient discharged to Home with Home Health Care in fair condition.   Discharge follow-up recommendations : See Hospital Course  Scheduled Future Appointments       Provider Department Dept Phone Center   10/09/2023 10:30 AM [place] [facility] 34 [facility] - 03 Infusion & Treatments [phone] [facility] [place]   10/10/2023 11:30 AM [place] [facility] - 03 Infusion & Treatments [phone] [facility] [place]   10/11/2023 3:00 PM [place] [facility] 32 [facility] - 03 Infusion & Treatments [phone] [facility] [place]   10/12/2023 10:30 AM [place] [facility] 32 [facility] - 03 Infusion &  Treatments [phone] [facility] [place]   10/13/2023 10:30 AM [place] [facility] 32 [facility] - 03 Infusion & Treatments [phone] [facility] [place]       Hospital Course: This is a 78 year old man with a PMH of HTN, DM2, myasthenia gravis, CVA, prostate cancer s/p prostatectomy and prostatic urethral sphincter, presented to the ED with fever and shortness of breath, diagnosed with hypoxia 2/2 COVID-19 infection and pneumonia, complicated UTI and bacteremia.  Has completed 5 days of remdesivir, hypoxia has resolved except with significant exertion then he desaturates but at rest it resolves.  His Decadron is being discontinued at discharge and switch back to p.o. prednisone  that he was taking for myasthenia gravis.  No longer febrile.  He has also completed azithromycin  and 5 days of cephalosporin.  Urine culture yielded no growth, currently getting ceftriaxone for complicated UTI as well as for E. coli bacteremia.  Plans to continue ceftriaxone at our infusion center being arranged by the ID specialist.  Being reverted to home regimen for management of his DM2.  During his stay this was difficult to control while on dexamethasone.  He was noted to have bilateral lower extremity edema which apparently was not present prior to admission.  He denies a history of CHF  but is noted that he has Mobitz type II heart block.  I ordered an echo but the patient is unwilling to wait for this.  I discussed that he may have this outpatient.  He responded well to 40 mg IV Lasix administered yesterday, with 3.7 L urine output and improvement in his lower extremity edema.  He is being discharged on lower dose of 20 mg p.o. Lasix, with p.o. potassium and magnesium to prevent low levels of this electrolytes.  Strongly recommended that BMP and magnesium be checked intermittently.  On exam today patient was sitting in recliner in no apparent distress. He had regular rate  and rhythm but no murmur Normal work of breathing, no wheezing or crackles 1+ BL LE pitting edema  Deemed appropriate for discharge after getting his IV Rocephin today.             Wound / Incision Assessment: Refer to Chart Review and Media Tab for images if available.   Wound 10/06/23 Skin Tear Arm Anterior;Left (Active)  Date First Assessed/Time First Assessed: 10/06/23 2040   Primary Wound Type: Skin Tear  Location: Arm  Wound Location Orientation: Anterior;Left  Wound Description (Comments): skin tear.     Wound 10/06/23 Other (comment) Arm Left;Lower;Posterior;Proximal (Active)  Date First Assessed/Time First Assessed: 10/06/23 2040   Primary Wound Type: (c) Other (comment)  Location: Arm  Wound Location Orientation: Left;Lower;Posterior;Proximal  Wound Description (Comments): Skin tear    Temp:  [97.3 F (36.3 C)-98.3 F (36.8 C)] 98.1 F (36.7 C) Heart Rate:  [62-93] 72 Resp:  [18-20] 18 BP: (126-149)/(64-99) 137/81      Discharge Medications     New Medications      Sig Disp Refill Start End  furosemide 20 mg tablet Commonly known as: LASIX  Take 1 tablet (20 mg total) by mouth in the morning for 5 days.  5 tablet  0  October 09, 2023    magnesium oxide 400 mg (241 mg magnesium) Tab  Take 1 tablet (400 mg total) by mouth daily for 14 days.  14 tablet  0     potassium chloride 20 mEq ER tablet  Take 1 tablet (20 mEq total) by mouth daily for 5 days.  5 tablet  0  October 09, 2023        Medications To Continue      Sig Disp Refill Start End  acetaminophen  500 mg tablet Commonly known as: TYLENOL   Take 1,000 mg by mouth every 6 (six) hours as needed for mild pain (1-3).   0     ammonium lactate 12 % lotion Commonly known as: LAC-HYDRIN  Apply 1 Application topically as needed.   0     aspirin 81 mg EC tablet  Take 81 mg by mouth Once Daily.  90 tablet  2     b complex vitamins Cap capsule  Take 1 capsule by mouth Once Daily.   0      carboxymethylcellulose 0.5 % Dpet Commonly known as: REFRESH PLUS  Administer 1 drop into each eyes 4 (four) times a day.   0     ferrous gluconate 324 mg (38 mg iron) Tab tablet  Take 38 mg of iron by mouth 3 (three) times a week.   0     fluticasone propionate 50 mcg/spray nasal spray Commonly known as: FLONASE  Administer 1 spray into each nostril daily.   0     metFORMIN 500 mg tablet Commonly known as: GLUCOPHAGE  Take  750 mg by mouth in the morning and 750 mg in the evening. Take with meals.   0     multivit-iron-folic acid-calcium-mins 9 mg iron-400 mcg tablet Commonly known as: THERA-M  Take 1 tablet by mouth daily.   0     nystatin 100,000 unit/mL suspension Commonly known as: MYCOSTATIN  Take 500,000 Units by mouth 4 (four) times a day.   0     omeprazole 20 mg Tbec  Take 20 mg by mouth daily.   0     predniSONE  20 mg tablet Commonly known as: DELTASONE   Take 60 mg by mouth Once Daily.  270 tablet  0     pyridostigmine 60 mg tablet Commonly known as: MESTINON  Take 75 mg by mouth 3 (three) times a day.   0     sennosides-docusate sodium 8.6-50 mg per tablet Commonly known as: PERICOLACE  Take 1 tablet by mouth daily.   0     sulfamethoxazole-trimethoprim 400-80 mg per tablet Commonly known as: BACTRIM  Take 1 tablet by mouth daily.   0     tretinoin (emollient) 0.05 % Crea  Apply 1 Application topically nightly as needed.   0         Discharge Orders     Discharge Diet (specify)     Details:    Diet type: Sodium Controlled (less than 2000 mg)   Comments: 1.5 L daily fluid restriction   Full Code     Lifting Limits:     Details:    Lifting Limits: No lifting limits   Occupational Therapy Home Health Coordination     Details:    Actions: Evaluate and Treat   Physical Therapy Home Health Coordination     Details:    Actions: Evaluate and Treat         Lab Results  Component Value Date/Time   HGB 10.9 (L) 10/07/2023 04:25  AM   HCT 30.4 (L) 10/07/2023 04:25 AM   WBC 6.50 10/07/2023 04:25 AM   PLT 170 10/07/2023 04:25 AM   Lab Results  Component Value Date/Time   NA 139 10/08/2023 06:28 AM   K 3.6 10/08/2023 06:28 AM   CREATININE 1.10 10/08/2023 06:28 AM   BUN 28 (H) 10/08/2023 06:28 AM   GLUCOSE 156 (H) 10/08/2023 06:28 AM    Pertinent Imaging: XR Chest 1 View  Final Result by Dorn Ozell Roulette, MD (01/02 0800)  CLINICAL DATA:  Fever    EXAM:  CHEST  1 VIEW    COMPARISON:  03/17/2023    FINDINGS:  Cardiac enlargement accentuated by mediastinal fat from most recent  chest CT.SABRA Indistinct density at the bases with streaky appearance.  There is known bilateral calcified pleural plaques. No edema,  effusion, or pneumothorax.    IMPRESSION:  1. Atelectasis or bronchopneumonia at the lung bases.  2. Asbestos related pleural disease.      Electronically Signed    By: Dorn Roulette M.D.    On: 10/03/2023 08:00        Electronically signed by: Jennings Graciela Keeler, MD 10/08/2023 3:09 PM   Time spent on discharge: 40 minutes

## 2023-10-14 NOTE — Progress Notes (Signed)
 Patient received IV Rocephin via PIV per order. PIV flushed with saline per protocol. Patient tolerated well. Pt educated about infusion and given opportunity to ask questions. Patient verbalized understanding of education. AVS given. D/c alert & ambulatory to home with family

## 2024-01-17 NOTE — ED Provider Notes (Signed)
 ------------------------------------------------------------------------------- Attestation signed by Carmelita Keller Mania, MD at 01/20/2024 10:15 AM ED Supervisory Statement:  I have evaluated the patient concurrently with the resident. I agree with the resident's history, physical, medical decision making, and plan as documented.    Plan is discharge with return precautions and recommendations for outpatient follow-up.  -------------------------------------------------------------------------------  Emergency Department Provider Note  Medical Decision Making  Patient is 78 year old male with a history of myasthenia gravis, prostate cancer who presents with cough over the last 2 to 3 days, presents from [place] outpatient clinic to rule out possible pneumonia.  Arrives in hemodynamically stable condition, afebrile, satting appropriately on room air.  Exam as below, appears well overall, no acute distress, no respiratory distress.  Lungs clear to auscultation, no wheezing or rhonchi.  Possible post infectious cough.  Possible viral or bacterial pneumonia.  Low concern for ACS, PE in the absence of any chest pain.  Appears well overall.  Will proceed with chest x-ray, basic lab work, COVID flu.  I personally ordered and reviewed relevant studies which were notable for: WBC 5.3.  CMP unremarkable.  Chest x-ray shows no evidence of focal pneumonia.  COVID flu pending.   On reevaluation,   Patient appears well overall, no evidence of acute bacterial pneumonia in the absence of fever, reassuring chest x-ray.  Overall safe for discharge with no further intervention at this time.  Patient amenable to this plan.  We did have shared decision making regarding longer course of antibiotics given he only completed 5 days of previously prescribed for bacterial pneumonia, however the patient has no current symptoms for bacterial process at this time, we agreed to proceed with supportive care and strict return  precautions.  Disposition: Patient discharged in stable condition with strict return precautions discussed.  patient tolerating p.o. intake while in the ED, mentating and ambulating at baseline.  Recommended close outpatient follow-up with her primary care doctor.  Hemodynamically stable without further intervention or complication during ED course.   1. Acute cough     ED Disposition     ED Disposition  Discharge   Condition  Stable   Comment  --         Clinical Complexity Patient's presentation is most consistent with acute complicated illness / injury requiring diagnostic workup.  History and Review of Systems  Chief Complaint:  Cough  HPI Tyrone Newton is a 78 y.o. year old male with significant PMH for diabetes, myasthenia gravis, prostate cancer who presents with a chief complaint of cough.  Patient arrives from [facility] outpatient clinic with concern for cough over the last 1 week.  [facility] Efartiigimod alfa infusion, but cannot receive if he has an active infection.  He has a history of intermittent pneumonias.  He was just treated over the last week with 5 days of p.o. antibiotics last week.  He completed this course.  He has not had any fevers over the last few days.  He has some mild dry persistent cough.  Denies chest pain or shortness of breath.  Denies abdominal pain, nausea, vomiting, dysuria, hematuria.  No other infectious symptoms reported.  Review of symptoms otherwise negative.  Past Medical History:  Diagnosis Date  . Cancer    (CMD)    prostate  . Diabetes mellitus    (CMD)   . Gout   . Kidney stones   . Prostate cancer    (CMD)   . Reflux esophagitis   .  Thoracic aortic aneurysm    Past Surgical History:  Procedure Laterality Date  . GALLBLADDER SURGERY     Procedure: GALLBLADDER SURGERY  . OTHER SURGICAL HISTORY     Procedure: OTHER SURGICAL HISTORY (prostate); removed   No family  history on file. Social History   Socioeconomic History  . Marital status: Married    Spouse name: Not on file  . Number of children: Not on file  . Years of education: Not on file  . Highest education level: Not on file  Occupational History  . Not on file  Tobacco Use  . Smoking status: Never  . Smokeless tobacco: Never  Substance and Sexual Activity  . Alcohol use: Not Currently  . Drug use: Not Currently  . Sexual activity: Not on file  Other Topics Concern  . Not on file  Social History Narrative  . Not on file   Social Drivers of Health   Food Insecurity: Low Risk  (10/03/2023)   Food vital sign   . Within the past 12 months, you worried that your food would run out before you got money to buy more: Never true   . Within the past 12 months, the food you bought just didn't last and you didn't have money to get more: Never true  Transportation Needs: No Transportation Needs (10/03/2023)   Transportation   . In the past 12 months, has lack of reliable transportation kept you from medical appointments, meetings, work or from getting things needed for daily living? : No  Safety: Low Risk  (10/03/2023)   Safety   . How often does anyone, including family and friends, physically hurt you?: Never   . How often does anyone, including family and friends, insult or talk down to you?: Never   . How often does anyone, including family and friends, threaten you with harm?: Never   . How often does anyone, including family and friends, scream or curse at you?: Never  Living Situation: Low Risk  (10/03/2023)   Living Situation   . What is your living situation today?: I have a steady place to live   . Think about the place you live. Do you have problems with any of the following? Choose all that apply:: None/None on this list     @ROS @  Negative except as per HPI   Physical Exam   Vitals:   01/17/24 1037 01/17/24 1038 01/17/24 1140 01/17/24 1210  BP: 153/81  (!) 164/68 153/80   Pulse: 76  70 78  Resp: 17     Temp: 97.6 F (36.4 C)     TempSrc: Oral     SpO2: 100%  99% 97%  Weight:  90.7 kg (200 lb)      Physical Exam Constitutional:      General: He is not in acute distress.    Appearance: Normal appearance. He is not toxic-appearing.  HENT:     Head: Normocephalic and atraumatic.     Mouth/Throat:     Mouth: Mucous membranes are moist.  Eyes:     Extraocular Movements: Extraocular movements intact.     Conjunctiva/sclera: Conjunctivae normal.     Pupils: Pupils are equal, round, and reactive to light.  Cardiovascular:     Rate and Rhythm: Regular rhythm.     Pulses: Normal pulses.     Heart sounds: No murmur heard. Pulmonary:     Effort: No respiratory distress.     Breath sounds: Normal breath sounds.  Abdominal:  General: There is no distension.     Palpations: Abdomen is soft.     Tenderness: There is no abdominal tenderness.  Musculoskeletal:        General: No swelling or deformity.     Cervical back: Normal range of motion.  Skin:    General: Skin is warm and dry.  Neurological:     General: No focal deficit present.     Mental Status: He is alert and oriented to person, place, and time.     Procedure Note  Procedures  Electronically signed by:  Morene Velinda Mulling, MD 01/17/2024 11:25 AM

## 2024-06-29 ENCOUNTER — Emergency Department (HOSPITAL_BASED_OUTPATIENT_CLINIC_OR_DEPARTMENT_OTHER): Admission: EM | Admit: 2024-06-29 | Discharge: 2024-06-30 | Disposition: A | Discharge status: Home / Self Care

## 2024-06-29 ENCOUNTER — Other Ambulatory Visit: Payer: Self-pay

## 2024-06-29 ENCOUNTER — Encounter (HOSPITAL_BASED_OUTPATIENT_CLINIC_OR_DEPARTMENT_OTHER): Payer: Self-pay

## 2024-06-29 DIAGNOSIS — Z7982 Long term (current) use of aspirin: Secondary | ICD-10-CM | POA: Insufficient documentation

## 2024-06-29 DIAGNOSIS — R739 Hyperglycemia, unspecified: Secondary | ICD-10-CM | POA: Diagnosis present

## 2024-06-29 DIAGNOSIS — E871 Hypo-osmolality and hyponatremia: Secondary | ICD-10-CM | POA: Diagnosis not present

## 2024-06-29 DIAGNOSIS — N179 Acute kidney failure, unspecified: Secondary | ICD-10-CM | POA: Diagnosis not present

## 2024-06-29 DIAGNOSIS — N3000 Acute cystitis without hematuria: Secondary | ICD-10-CM

## 2024-06-29 LAB — CBC WITH DIFFERENTIAL/PLATELET
Abs Immature Granulocytes: 0.06 K/uL (ref 0.00–0.07)
Basophils Absolute: 0 K/uL (ref 0.0–0.1)
Basophils Relative: 0 %
Eosinophils Absolute: 0 K/uL (ref 0.0–0.5)
Eosinophils Relative: 0 %
HCT: 36.7 % — ABNORMAL LOW (ref 39.0–52.0)
Hemoglobin: 12.4 g/dL — ABNORMAL LOW (ref 13.0–17.0)
Immature Granulocytes: 1 %
Lymphocytes Relative: 7 %
Lymphs Abs: 0.5 K/uL — ABNORMAL LOW (ref 0.7–4.0)
MCH: 29.8 pg (ref 26.0–34.0)
MCHC: 33.8 g/dL (ref 30.0–36.0)
MCV: 88.2 fL (ref 80.0–100.0)
Monocytes Absolute: 0.2 K/uL (ref 0.1–1.0)
Monocytes Relative: 3 %
Neutro Abs: 6.4 K/uL (ref 1.7–7.7)
Neutrophils Relative %: 89 %
Platelets: 168 K/uL (ref 150–400)
RBC: 4.16 MIL/uL — ABNORMAL LOW (ref 4.22–5.81)
RDW: 14.7 % (ref 11.5–15.5)
WBC: 7.1 K/uL (ref 4.0–10.5)
nRBC: 0 % (ref 0.0–0.2)

## 2024-06-29 LAB — URINALYSIS, W/ REFLEX TO CULTURE (INFECTION SUSPECTED)
Bilirubin Urine: NEGATIVE
Glucose, UA: >=500 mg/dL — AB
Ketones, ur: NEGATIVE mg/dL
Nitrite: POSITIVE — AB
Protein, ur: NEGATIVE mg/dL
Specific Gravity, Urine: 1.01 (ref 1.005–1.030)
pH: 5.5 (ref 5.0–8.0)

## 2024-06-29 LAB — BETA-HYDROXYBUTYRIC ACID: Beta-Hydroxybutyric Acid: 0.43 mmol/L — ABNORMAL HIGH (ref 0.05–0.27)

## 2024-06-29 LAB — CBG MONITORING, ED
Glucose-Capillary: 521 mg/dL (ref 70–99)
Glucose-Capillary: 446 mg/dL — ABNORMAL HIGH (ref 70–99)

## 2024-06-29 LAB — BASIC METABOLIC PANEL WITH GFR
Anion gap: 14 (ref 5–15)
BUN: 32 mg/dL — ABNORMAL HIGH (ref 8–23)
CO2: 21 mmol/L — ABNORMAL LOW (ref 22–32)
Calcium: 9.5 mg/dL (ref 8.9–10.3)
Chloride: 97 mmol/L — ABNORMAL LOW (ref 98–111)
Creatinine, Ser: 1.35 mg/dL — ABNORMAL HIGH (ref 0.61–1.24)
GFR, Estimated: 53 mL/min — ABNORMAL LOW (ref >60)
Glucose, Bld: 579 mg/dL (ref 70–99)
Potassium: 5.1 mmol/L (ref 3.5–5.1)
Sodium: 132 mmol/L — ABNORMAL LOW (ref 135–145)

## 2024-06-29 LAB — I-STAT VENOUS BLOOD GAS, ED
Acid-base deficit: 6 mmol/L — ABNORMAL HIGH (ref 0.0–2.0)
Bicarbonate: 18.8 mmol/L — ABNORMAL LOW (ref 20.0–28.0)
Calcium, Ion: 1.18 mmol/L (ref 1.15–1.40)
HCT: 32 % — ABNORMAL LOW (ref 39.0–52.0)
Hemoglobin: 10.9 g/dL — ABNORMAL LOW (ref 13.0–17.0)
O2 Saturation: 85 %
Potassium: 4.9 mmol/L (ref 3.5–5.1)
Sodium: 132 mmol/L — ABNORMAL LOW (ref 135–145)
TCO2: 20 mmol/L — ABNORMAL LOW (ref 22–32)
pCO2, Ven: 32.7 mmHg — ABNORMAL LOW (ref 44–60)
pH, Ven: 7.366 (ref 7.25–7.43)
pO2, Ven: 51 mmHg — ABNORMAL HIGH (ref 32–45)

## 2024-06-29 MED ORDER — INSULIN ASPART 100 UNIT/ML IJ SOLN
6.0000 [IU] | Freq: Once | INTRAMUSCULAR | Status: AC
  Administered 2024-06-29: 6 [IU] via SUBCUTANEOUS

## 2024-06-29 MED ORDER — SODIUM CHLORIDE 0.9 % IV SOLN
2.0000 g | Freq: Once | INTRAVENOUS | Status: AC
  Administered 2024-06-29: 2 g via INTRAVENOUS
  Filled 2024-06-29: qty 20

## 2024-06-29 MED ORDER — SODIUM CHLORIDE 0.9 % IV BOLUS
1000.0000 mL | Freq: Once | INTRAVENOUS | Status: AC
  Administered 2024-06-29: 1000 mL via INTRAVENOUS

## 2024-06-29 NOTE — ED Provider Notes (Signed)
 [facility] Provider Note   CSN: [id number] Arrival date & time: 06/29/24  1710     Patient presents with: Hyperglycemia   Tyrone Newton is a 78 y.o. male referred to the ED secondary to concerns over high blood sugar.  Was at his primary care earlier for routine appointment, and they were concerned for an elevated blood glucose reading over 500, also had a reported CO2 of 23.  He takes prednisone  regularly for myasthenia gravis, and patient reports that he has frequent fluctuations in his blood sugar and has had previous visits for similar type issues.  Last oral intake was 30 minutes prior to arrival, he denies any nausea or vomiting, denies any abdominal pain.  He is asymptomatic at present.    Hyperglycemia Associated symptoms: no abdominal pain, no chest pain, no dysuria, no fever, no nausea, no shortness of breath, no vomiting and no weakness        Prior to Admission medications   Medication Sig Start Date End Date Taking? Authorizing Provider  acetaminophen  (TYLENOL ) 500 MG tablet Take 500 mg by mouth every 6 (six) hours as needed (for muscle soreness or pain).    [provider]  albuterol  (PROVENTIL  HFA;VENTOLIN  HFA) 108 (90 Base) MCG/ACT inhaler Inhale 2 puffs into the lungs every 6 (six) hours as needed for wheezing or shortness of breath.    [provider]  allopurinol (ZYLOPRIM) 300 MG tablet Take 150 mg by mouth daily.    [provider]  aspirin 81 MG chewable tablet Chew 81 mg by mouth daily.    [provider]  azithromycin  (ZITHROMAX  Z-PAK) 250 MG tablet Take 1 tablet (250 mg total) by mouth daily. 01/07/18   Odell Balls, PA-C  benzonatate  (TESSALON ) 100 MG capsule Take 100 mg by mouth 3 (three) times daily as needed for cough.    [provider]  calcium carbonate (TUMS - DOSED IN MG ELEMENTAL CALCIUM) 500 MG chewable tablet Chew 1 tablet by mouth daily.    [provider]  chlorpheniramine-HYDROcodone (TUSSIONEX PENNKINETIC ER) 10-8 MG/5ML SUER Take 5 mLs by mouth 2 (two) times daily. 01/07/18   Odell Balls, PA-C  docusate sodium (COLACE) 100 MG capsule Take 100 mg by mouth daily.    [provider]  fexofenadine (ALLEGRA) 180 MG tablet Take 180 mg by mouth daily after breakfast.    [provider]  guaiFENesin-codeine (ROBITUSSIN AC) 100-10 MG/5ML syrup Take 5 mLs by mouth 3 (three) times daily as needed for cough.    [provider]  levalbuterol  (XOPENEX  HFA) 45 MCG/ACT inhaler Inhale 2 puffs into the lungs every 6 (six) hours as needed for wheezing. 01/07/18   Odell Balls, PA-C  losartan (COZAAR) 25 MG tablet Take 25 mg by mouth daily.    [provider]  metFORMIN (GLUCOPHAGE) 500 MG tablet Take 500 mg by mouth 2 (two) times daily with a meal.    [provider]  montelukast (SINGULAIR) 10 MG tablet Take 10 mg by mouth daily.    [provider]  omeprazole (PRILOSEC) 20 MG capsule Take 20 mg by mouth daily.    [provider]  predniSONE  (DELTASONE ) 20 MG tablet Take 3 tablets (60 mg total) by mouth daily. 01/07/18   Odell Balls, PA-C    Allergies: Ditropan xl [oxybutynin] and Nitrofurantoin    Review of Systems  Constitutional:  Negative for chills and fever.  HENT:  Negative for sore throat.  Eyes:  Negative for visual disturbance.  Respiratory:  Negative for cough and shortness of breath.   Cardiovascular:  Negative for chest pain.  Gastrointestinal:  Negative for abdominal pain, diarrhea, nausea and vomiting.  Genitourinary:  Negative for dysuria and frequency.  Musculoskeletal:  Negative for back pain and neck pain.  Skin:  Negative for rash.  Neurological:  Negative for weakness, numbness and headaches.  Hematological:  Negative for adenopathy.  Psychiatric/Behavioral:  Negative for behavioral problems.   All other systems reviewed and are negative.   Updated  Vital Signs BP 119/86 (BP Location: Right Arm)   Pulse 86   Temp 97.7 F (36.5 C) (Oral)   Resp 18   Ht 6' 3 (1.905 m)   Wt 88.9 kg   SpO2 100%   BMI 24.50 kg/m   Physical Exam Vitals and nursing note reviewed.  Constitutional:      General: He is not in acute distress.    Appearance: Normal appearance.  HENT:     Head: Normocephalic and atraumatic.     Mouth/Throat:     Mouth: Mucous membranes are moist.     Pharynx: Oropharynx is clear.  Eyes:     Extraocular Movements: Extraocular movements intact.     Conjunctiva/sclera: Conjunctivae normal.     Pupils: Pupils are equal, round, and reactive to light.  Cardiovascular:     Rate and Rhythm: Normal rate and regular rhythm.     Pulses: Normal pulses.     Heart sounds: Normal heart sounds. No murmur heard.    No friction rub. No gallop.  Pulmonary:     Effort: Pulmonary effort is normal.     Breath sounds: Normal breath sounds.  Abdominal:     General: Abdomen is flat. Bowel sounds are normal.     Palpations: Abdomen is soft.     Tenderness: There is no abdominal tenderness.  Musculoskeletal:        General: Normal range of motion.     Cervical back: Normal range of motion and neck supple.     Right lower leg: No edema.     Left lower leg: No edema.  Skin:    General: Skin is warm and dry.     Capillary Refill: Capillary refill takes less than 2 seconds.     Comments: Normal skin turgor  Neurological:     General: No focal deficit present.     Mental Status: He is alert. Mental status is at baseline.  Psychiatric:        Mood and Affect: Mood normal.     (all labs ordered are listed, but only abnormal results are displayed) Labs Reviewed  CBC WITH DIFFERENTIAL/PLATELET - Abnormal; Notable for the following components:      Result Value   RBC 4.16 (*)    Hemoglobin 12.4 (*)    HCT 36.7 (*)    Lymphs Abs 0.5 (*)    All other components within normal limits  BASIC METABOLIC PANEL WITH GFR - Abnormal;  Notable for the following components:   Sodium 132 (*)    Chloride 97 (*)    CO2 21 (*)    Glucose, Bld 579 (*)    BUN 32 (*)    Creatinine, Ser 1.35 (*)    GFR, Estimated 53 (*)    All other components within normal limits  URINALYSIS, W/ REFLEX TO CULTURE (INFECTION SUSPECTED) - Abnormal; Notable for the following components:   Glucose, UA >=500 (*)    Hgb  urine dipstick SMALL (*)    Nitrite POSITIVE (*)    Leukocytes,Ua TRACE (*)    Bacteria, UA FEW (*)    All other components within normal limits  CBG MONITORING, ED - Abnormal; Notable for the following components:   Glucose-Capillary 521 (*)    All other components within normal limits  I-STAT VENOUS BLOOD GAS, ED - Abnormal; Notable for the following components:   pCO2, Ven 32.7 (*)    pO2, Ven 51 (*)    Bicarbonate 18.8 (*)    TCO2 20 (*)    Acid-base deficit 6.0 (*)    Sodium 132 (*)    HCT 32.0 (*)    Hemoglobin 10.9 (*)    All other components within normal limits  URINALYSIS, ROUTINE W REFLEX MICROSCOPIC  BETA-HYDROXYBUTYRIC ACID  CBG MONITORING, ED    EKG: None  Radiology: No results found.   Procedures   Medications Ordered in the ED  sodium chloride  0.9 % bolus 1,000 mL (1,000 mLs Intravenous New Bag/Given 06/29/24 1911)  insulin aspart (novoLOG) injection 6 Units (6 Units Subcutaneous Given 06/29/24 1912)  cefTRIAXone (ROCEPHIN) 2 g in sodium chloride  0.9 % 100 mL IVPB (2 g Intravenous New Bag/Given 06/29/24 1921)                                    Medical Decision Making Amount and/or Complexity of Data Reviewed Labs: ordered.  Risk Prescription drug management. Decision regarding hospitalization.   Medical Decision Making:   OSEAS DETTY is a 78 y.o. male who presented to the ED today with elevated blood glucose detailed above.     Complete initial physical exam performed, notably the patient  was alert and oriented in no apparent distress.  Physical exam was largely unremarkable.   Initial CBG is 521.    Reviewed and confirmed nursing documentation for past medical history, family history, social history.    Initial Assessment:   With the patient's presentation of elevated blood glucose, consider differential diagnosis of HHS or DKA.   Initial Plan:  Evaluate blood glucose Screening labs including CBC and Metabolic panel to evaluate for infectious or metabolic etiology of disease.  Urinalysis with reflex culture ordered to evaluate for UTI or relevant urologic/nephrologic pathology.  Evaluate VBG as well as beta hydroxybutyric acid Objective evaluation as below reviewed   Initial Study Results:   Laboratory  All laboratory results reviewed without evidence of clinically relevant pathology.   Exceptions include: Glucose is elevated to 579 on the BMP, creatinine is 1.35 with corresponding decrease in GFR to 53.  Also has a decreased hemoglobin and hematocrit suggesting likely hemoconcentration.  Normal anion gap appreciated, pCO2 on i-STAT VBG is 32.7 with bicarb of 18.8.  Sodium is noted at 132.  UA shows positive for nitrites and leukocytes, greater than 500 glycosuria.      Consults: Case discussed with Dr. Gifford with hospitalist team.   Reassessment and Plan:   Given his largely elevated blood glucose level, and new onset AKI, along with findings consistent with UTI, plan for admission at this time.  Also will begin IV ceftriaxone for his urinary findings, began infusion of IV fluids, normal saline to manage his fluid volume deficit as well as hyponatremia, he has been provided with insulin subcutaneously for management of his blood glucose.  Pending results of beta hydroxybutyric acid however given normal anion gap, believe that this will be  within normal limits.   Discussed case with hospitalist team as noted, they agree to admit patient with plan to continue IV hydration as well as monitoring drug use glucose and insulin therapy, continue IV antibiotics for  UTI.     Final diagnoses:  Hyperglycemia  AKI (acute kidney injury)  Hyponatremia    ED Discharge Orders     None          Myriam Dorn BROCKS, [place] 06/29/24 2030    Simon Lavonia SAILOR, MD 06/29/24 2127

## 2024-06-29 NOTE — ED Triage Notes (Signed)
 [facility] reports being [facility] for medical exam and being told his blood sugar was 515 and to go to ER. Pt on prednisone  chronically for past year and a half. Pt states dosage increased from 20mg  to 30mg  for past 3 months. Baseline glucose 230-250 since medication dosage changed. Last time patient checked glucose was Saturday morning and was around 250. Pt reports not feeling good today. No definitive symptoms, denies N/V/D. No increased urinary output. Pt awake and alert. NAD noted in triage.

## 2024-06-30 LAB — CBG MONITORING, ED: Glucose-Capillary: 333 mg/dL — ABNORMAL HIGH (ref 70–99)

## 2024-06-30 MED ORDER — CEPHALEXIN 500 MG PO CAPS: 500.0000 mg | ORAL_CAPSULE | Freq: Two times a day (BID) | ORAL | 0 refills | Status: AC

## 2024-06-30 NOTE — ED Provider Notes (Signed)
 Called to room to re-evaluate patient. He reports he is feeling much better and wants to go home. Glucose is improved and he is comfortable managing at home. Recommend he continue with DM meds, oral hydration. Rx for Keflex for UTI. PCP follow up, RTED for any other concerns.     Roselyn Carlin NOVAK, MD 06/30/24 [phone]

## 2024-07-31 DIAGNOSIS — I10 Essential (primary) hypertension: Secondary | ICD-10-CM | POA: Diagnosis present

## 2024-07-31 DIAGNOSIS — Z7901 Long term (current) use of anticoagulants: Secondary | ICD-10-CM

## 2024-07-31 DIAGNOSIS — R296 Repeated falls: Secondary | ICD-10-CM

## 2024-07-31 NOTE — Progress Notes (Addendum)
 [facility] CARDIAC ELECTROPHYSIOLOGY CLINIC NOTE  PCP:  Jayson Ricardo Ill, MD Primary Cardiologist:  --- Primary Electrophysiologist: Dr. Belvie Rand -[facility]  Date of Clinic Visit:  07/31/2024  Reason for Visit: [facility] for PVI/CTI for persistent AF and typical AFL for Dr. Catheline   History of Present Illness:  Tyrone Newton is a 78 y.o. year old male with history of  Newly diagnosed Persistent AF in July 2025 (eliquis )  S/p DCCV (06/04/24)  Mobitz 1 AV block T2DM (A1c 10.4) HTN H/o CVA, chronic (right cerebellum punctate infarct, 2024)  Additional PMHX:  Myasthenia gravis on pyridostigmine /prednisone    Gout Prostate cancer s/p prostatectomy, has urethral sphincter - cannot have placement of urinary catheter History of Present Illness This very pleasant 78 year old male who presents for referral for atrial fibrillation/flutter ablation. Accompanied by wife Davis.   He reports significant improvement in his symptoms of lightheadedness, fatigue, and dyspnea on exertion after having his cardioversion on 9/4. Although, he was only symptom free for 4 days. He experiences chronic lightheadedness, both at rest and during activity. Denies pre syncope or syncope. He experiences intermittent shortness of breath during physical activity but does not need to pause for breath. He denies chest discomfort, pressure, or tightness. He has recently resumed his exercise regimen. These exercises do exacerbate his shortness of breath.   He reports an unintentional weigh loss of 91 pounds over the past year. He reports his 3 hospitalizations in the past year have contributed to his deconditioning, muscle loss, weight loss.   He reports 5 falls in the past 4 weeks. He has had 3 falls this week, including one incident at church where his legs buckled but he did not lose consciousness. He sustained a significant hand injury during this fall, which caused a  large skin tear the size of his hand, requiring evaluation and wound care with dressing changes. He reports a week ago he had a fall where he sustained a head injury, hitting his head on his truck, which was evaluated with an MRI at the [facility] and showed no bleeding. He believes his falls are primarily due to neuropathy rather than myasthenia gravis. He experiences sharp pains in his legs and feet throughout the day and night.  His blood pressure readings at home have been consistent with those taken in the clinic.   He is currently on Eliquis  5 mg twice daily. He reports no hematochezia, melena, or hematuria. He has had 2 episodes of epistaxis in the past 6 to 7 months, which resolved spontaneously. He admits to easy bruising on his arms.  He has never been prescribed medication for arrhythmia.  SOCIAL HISTORY Exercise: The patient started exercising again two days ago, performing multiple sessions each day. Exercises include raising toes, hip raises, hip adduction/abduction, knees down, squats, and arm exercises in a recliner. Walks with walker.   FAMILY HISTORY - Brother: Stroke, died on 08/13/2025of this year.  Medications Ordered Prior to Encounter[1]  Medical History[2]  Surgical History[3]  Family History[4]  Social History   Socioeconomic History  . Marital status: Married    Spouse name: Not on file  . Number of children: Not on file  . Years of education: Not on file  . Highest education level: Not on file  Occupational History  . Not on file  Tobacco Use  . Smoking status: Never  . Smokeless tobacco: Never  Substance and Sexual Activity  . Alcohol  use: Not Currently  .  Drug use: Not Currently  . Sexual activity: Not on file  Other Topics Concern  . Not on file  Social History Narrative  . Not on file   Social Drivers of Health   Food Insecurity: Low Risk  (10/03/2023)   Food vital sign   . Within the past 12 months, you worried that your food would run out before  you got money to buy more: Never true   . Within the past 12 months, the food you bought just didn't last and you didn't have money to get more: Never true  Transportation Needs: No Transportation Needs (10/03/2023)   Transportation   . In the past 12 months, has lack of reliable transportation kept you from medical appointments, meetings, work or from getting things needed for daily living? : No  Safety: Low Risk  (07/31/2024)   Safety   . How often does anyone, including family and friends, physically hurt you?: Never   . How often does anyone, including family and friends, insult or talk down to you?: Never   . How often does anyone, including family and friends, threaten you with harm?: Never   . How often does anyone, including family and friends, scream or curse at you?: Never  Living Situation: Low Risk  (10/03/2023)   Living Situation   . What is your living situation today?: I have a steady place to live   . Think about the place you live. Do you have problems with any of the following? Choose all that apply:: None/None on this list    ROS:  Review of Systems  Constitutional: Positive for malaise/fatigue.  HENT:  Positive for nosebleeds.   Eyes: Negative.   Cardiovascular:  Positive for dyspnea on exertion. Negative for leg swelling, near-syncope and syncope.  Respiratory: Negative.    Hematologic/Lymphatic: Bruises/bleeds easily.  Skin: Negative.   Musculoskeletal:  Positive for falls and muscle weakness.  Gastrointestinal: Negative.  Negative for hematemesis, hematochezia and melena.  Genitourinary: Negative.  Negative for hematuria.  Neurological:  Positive for light-headedness.  Psychiatric/Behavioral: Negative.      Physical exam:  Vitals:   07/31/24 1442  BP: 119/75  BP Location: Left arm  Patient Position: Sitting  Pulse: 80  SpO2: 100%  Weight: 85.9 kg (189 lb 6.4 oz)    BP Readings from Last 3 Encounters:  07/31/24 119/75  01/17/24 153/80  10/14/23 121/63     Wt Readings from Last 3 Encounters:  07/31/24 85.9 kg (189 lb 6.4 oz)  01/17/24 90.7 kg (200 lb)  10/03/23 94.3 kg (208 lb)     Physical Exam Vitals reviewed.  Constitutional:      Appearance: Normal appearance. He is not ill-appearing.  Eyes:     Extraocular Movements: Extraocular movements intact.     Pupils: Pupils are equal, round, and reactive to light.  Cardiovascular:     Rate and Rhythm: Normal rate. Rhythm irregular.     Pulses: Normal pulses.     Heart sounds: Normal heart sounds.  Pulmonary:     Effort: Pulmonary effort is normal.     Breath sounds: Normal breath sounds.  Musculoskeletal:     Right lower leg: No edema.     Left lower leg: No edema.  Skin:    General: Skin is warm and dry.     Comments: Bandage/dressing to posterior hand   Neurological:     General: No focal deficit present.     Mental Status: He is alert.  Psychiatric:  Mood and Affect: Mood normal.     Diagnostics and Data Reviewed:  EKG: personally reviewed by me with the following interpretation - 07/31/24 V rate: 74 PR interval: -- QRSd: 74 Qtc: 424 Coarse AF - reviewed with Dr. Catheline   Previous EKG: 10/04/23 V rate: 113 PR interval: 142 QRSd: 88 Qtc: 482 Sinus tachycardia   Zio/Monitor: 02/19/2024 9 days 14 hours Heart rate minimum 35 bpm, max 146 bpm, average 72 bpm Sinus rate minimum 61 bpm, max 108 bpm, average 71 bpm Isolated supraventricular ectopy, frequency frequent, percent 6.36% Isolated ventricular ectopy, frequency rate, percent 0.03% Ventricular tachycardia-number of episodes-1 Longest ventricular tachycardia episode, 2.6 seconds, 4 beats, max 146 beats, average 133 bpm Summary Patient had a minimum heart rate of 35 bpm, max heart rate of 146 bpm and average heart rate of 72 bpm Predominant underlying rhythm was sinus rhythm First-degree AV block was present 1 run of VT occurred lasting 4 beats with a max rate of 146 bpm Second-degree AV block Mobitz 1  Wenckebach was present Isolated SVE's were frequent 6.4%, 63250 SVE couplets were occasional 1.4%, 7170 Isolated VE's were rare less than 1.0%, 263  Echocardiogram: 03/2024 The left ventricular size, thickness and function are normal EF equals 73.4 to 74.4% by 2D biplane method The left ventricular wall motion is normal Grade 1 diastolic LV dysfunction with normal LV filling pressures Moderately dilated RV with normal RV systolic function. The left ventricular atrial size is normal The right atrium is moderately dilated No hemodynamically significant valvular aortic stenosis Mild aortic regurgitation The aortic valve pressure half-time is 625 ms There is mild to moderate mitral regurgitation There is mild tricuspid regurgitation Mildly elevated RVSP at 3 4 mmHg Normal size IVC with less than 50% respiratory collapse suggesting high normal RA pressure, estimated at 8 mmHg Mildly dilated ascending thoracic aorta at 3.9 cm There is no pericardial effusion  Echo: 10/18/22:  SUMMARY  Left ventricular systolic function is normal.  LV ejection fraction = 60-65%.  There is trace aortic regurgitation.  There is mild mitral regurgitation.  There is mild tricuspid regurgitation.  There is no pericardial effusion.  IVC size was normal.  There is no comparison study available.   Labs:  Lab Results  Component Value Date   WBC 5.30 01/17/2024   HGB 12.0 (L) 01/17/2024   HCT 33.5 (L) 01/17/2024   MCV 86.8 01/17/2024   PLT 174 01/17/2024   Lab Results  Component Value Date   CREATININE 1.15 01/17/2024   BUN 23 01/17/2024   NA 142 01/17/2024   K 4.4 01/17/2024   CL 109 (H) 01/17/2024   CO2 24 01/17/2024   Lab Results  Component Value Date   ALT 24 01/17/2024   AST 19 01/17/2024   BILITOT 1.1 (H) 01/17/2024   No results found for: TSH, T3TOTAL, T4TOTAL, THYROIDAB, FREET4 Lab Results  Component Value Date   HGBA1C 10.4 (H) 10/03/2023   No results found for: INR,  PROTIME  --CHA2DS2VASC:  CHA2DS2-VASc Stroke Risk Points: 7  Values used to calculate this score:   Points  Metrics      0        Has Congestive Heart Failure: No      1        Has Hypertension: Yes      2        Age: 74      1        Has Diabetes: Yes  2        Had Stroke: Yes                Had TIA: No                Had Thromboembolism: No      1        Has Vascular Disease: Yes      0        Clinically Relevant Sex: Male  --OAC: Eliquis  --Rate control: --- --AAD: --- --Previous procedures: DCCV (06/04/24) --EF: Preserved  --CrCl cannot be calculated (Patient's most recent lab result is older than the maximum 180 days allowed.).   Assessment and Plan:  Problem List Items Addressed This Visit       Cardiovascular and Mediastinum   Persistent atrial fibrillation    (CMD) - Primary   Relevant Orders   ECG 12 lead   ECG 12 lead (Completed)   CT Heart Eval Card & Morph w Con w 3D Pred Mod When Performed   Case Request EP Lab: CV ABLATION ATRIAL FIBRILLATION, CV EP CLOSURE TRANSCATHETER LEFT ATRIAL APPENDAGE, CV ABLATION ATRIAL FLUTTER (Completed)   Mobitz type 1 second degree atrioventricular block     Other   Chronic anticoagulation   Anticoagulated on Eliquis    Relevant Orders   Case Request EP Lab: CV ABLATION ATRIAL FIBRILLATION, CV EP CLOSURE TRANSCATHETER LEFT ATRIAL APPENDAGE, CV ABLATION ATRIAL FLUTTER (Completed)   Other Visit Diagnoses       Frequent falls       Relevant Orders   Case Request EP Lab: CV ABLATION ATRIAL FIBRILLATION, CV EP CLOSURE TRANSCATHETER LEFT ATRIAL APPENDAGE, CV ABLATION ATRIAL FLUTTER (Completed)        Orders Placed This Encounter  Procedures  . CT Heart Eval Card & Morph w Con w 3D Pred Mod When Performed  . ECG 12 lead  . ECG 12 lead     Assessment & Plan HENOK HEACOCK is a 78 y.o. male with PMHx significant for newly diagnosed persistent AF in July 2025 with symptoms of lightheadedness, dyspnea on exertion, and  fatigue. He is s/p DCCV (06/04/24). He presents for  [facility] referral from [facility] for ablation.   AF/AFL Frequent Falls - EKG today shows coarse AF with HR 74  - Admits to symptoms of lightheadedness, fatigue and dyspnea on exertion since his diagnosis of AF/AFL in July - Reports significant improvement in his symptoms after his cardioversion on 9/4 but this only lasted for 4 days. (Of note: tolerated propofol without complications) - Explained ablation for atrial fibrillation and atrial flutter using visual aid including risks, benefits, and post-operative restrictions.  - Explained the LAAO/Watchman procedure including indication, risks, benefits and close follow up. Explained TEE probe will be used to visualize left atrial appendage because the esophagus sits up against back of heart. Explained he will have bite guard in mouth for procedure to protect his teeth and our TEE equipment. Explained TEE probe will be lubricated to prevent esophageal injury.  - Staffed case with Dr. Catheline. Decision was made due to frequency of falls on Eliquis  with bleeding risk to plan for Concomitant PVI/CTI + LAAO/Watchman. Message sent to [facility] coordinator. Message sent to schedulers for patient to see pre-operative anaesthesia clinic for myasthenia gravis. - CT Heart to be performed ~ 2 weeks before the ablation procedure for anatomical imaging of the pulmonary veins for the atrial fibrillation ablation and imaging/measurements/structure of the left atrial appendage for LAAO/watchman  -  Patient to continue Eliquis  5 mg twice daily. Patient will have 45 day TEE and receive telephone call from coordinator for Eliquis  changes to ASA/Plavix at 8 weeks if there is no evidence of device leak or clot on TEE. At 6 month clinic visit transition from DAPT to ASA monotherapy. Patient to have TEE at 1 year post procedure.   - Continue current medications including:  OAC: Eliquis  5 mg BID (Hold day of procedure)  Rate  control: None  AAD: None   3. Frequent Falls 4. Neuropathy  5. Myasthenia gravis  - He reports 5 falls in the past 4 weeks, with 3 falls this week. Admits to head trauma 1 week ago. - Believes his neuropathy is contributing to his falls  - On pyridostigmine  and prednisone   - Encouraged to continue doing his exercises to regain strength and prevent further muscle deconditioning.  5. Prostate cancer s/p prostatectomy - Has urethral sphincter - cannot have placement of urinary catheter   Return to Clinic: 6 months or sooner PRN   Scheduled Future Appointments       Provider Department Dept Phone Center   01/22/2025 2:30 PM Arlyne [place] [facility] - Electrophysiology [phone] [facility] [place] Arlyne [place]  Fri 07/31/2024 5:30 PM    TotalTime   I have personally spent 45 minutes involved in face-to-face and non-face-to-face activities for this patient on the day of the visit.  Professional time spent includes chart review, obtaining history, physical exam, counseling, documenting, care coordination   Patient is aware to follow up more acutely with new/worsening symptoms related to arrhythmia. If any changes/questions/concerns patient will call access line [phone]).  [facility] Cardiovascular Medicine, Electrophysiology       [1] Current Outpatient Medications on File Prior to Visit  Medication Sig Dispense Refill  . acetaminophen  (TYLENOL ) 500 mg tablet Take 1,000 mg by mouth every 6 (six) hours as needed for mild pain (1-3).    SABRA ammonium lactate (LAC-HYDRIN) 12 % lotion Apply 1 Application topically as needed.    . apixaban  (ELIQUIS ) 5 mg tab Take 5 mg by mouth 2 (two) times a day. for stroke prevention (caution: blood thinner)    . b complex vitamins cap capsule Take 1 capsule by mouth Once Daily.    . empagliflozin-metFORMIN 12.5-1,000 mg TBph Take 1 tablet by mouth  daily. for diabetes    . ferrous gluconate 324 mg (38 mg iron) tab tablet Take 38 mg of iron by mouth 3 (three) times a week. (Patient taking differently: Take 38 mg of iron by mouth 2 (two) times a week.)    . fluticasone propionate (FLONASE) 50 mcg/spray nasal spray Administer 1 spray into each nostril daily.    . multivit-iron-folic acid-calcium-mins (THERA-M) 9 mg iron-400 mcg tablet Take 1 tablet by mouth daily.    SABRA nystatin (MYCOSTATIN) 100,000 unit/mL suspension Take 500,000 Units by mouth 4 (four) times a day.    SABRA omeprazole 20 mg TbEC Take 20 mg by mouth daily.    . predniSONE  (DELTASONE ) 20 mg tablet Take 60 mg by mouth Once Daily. (Patient taking differently: Take 25 mg by mouth daily.) 270 tablet 0  . pyridostigmine  (MESTINON ) 60 mg tablet Take 75 mg by mouth 3 (three) times a day.    . sennosides-docusate sodium (PERICOLACE) 8.6-50 mg per tablet Take 1 tablet by mouth daily.    SABRA sulfamethoxazole-trimethoprim (BACTRIM) 400-80 mg  per tablet Take 1 tablet by mouth daily.    . carboxymethylcellulose (REFRESH PLUS) 0.5 % dpet Administer 1 drop into each eyes 4 (four) times a day.    . tretinoin, emollient, 0.05 % crea Apply 1 Application topically nightly as needed.    . [DISCONTINUED] aspirin 81 mg EC tablet Take 81 mg by mouth Once Daily. 90 tablet 2  . [DISCONTINUED] furosemide (LASIX) 20 mg tablet Take 1 tablet (20 mg total) by mouth in the morning for 5 days. 5 tablet 0  . [DISCONTINUED] metFORMIN (GLUCOPHAGE) 500 mg tablet Take 750 mg by mouth in the morning and 750 mg in the evening. Take with meals.    . [DISCONTINUED] potassium chloride  20 mEq ER tablet Take 1 tablet (20 mEq total) by mouth daily for 5 days. 5 tablet 0   No current facility-administered medications on file prior to visit.  [2] Past Medical History: Diagnosis Date  . Cancer    (CMD)    prostate  . Diabetes mellitus   . Gout   . Kidney stones   . Prostate cancer    (CMD)   . Reflux esophagitis   .  Thoracic aortic aneurysm   [3] Past Surgical History: Procedure Laterality Date  . GALLBLADDER SURGERY     Procedure: GALLBLADDER SURGERY  . OTHER SURGICAL HISTORY     Procedure: OTHER SURGICAL HISTORY (prostate); removed  [4] No family history on file.

## 2024-07-31 NOTE — Progress Notes (Signed)
 Fall Risk Assessment:  Patient identified as a Fall Risk. Patient and family were educated to ask for assistance with ambulation. Fall risk sticker placed on patient and magnet placed on door. Assessment completed in 1 minutes.

## 2024-09-03 NOTE — ED Provider Notes (Signed)
 Patient placed in First Look pathway, seen and evaluated for chief complaint of a painful swollen mass to right ribcage onset two weeks ago. Patient sent by [facility] for a CT scan. Patient currently taking doxycycline.  Denies any other complaints. Pertinent exam findings include afebrile, VSS, NAD, unlabored respirations, speaking in complete sentences. Patient counseled on process, plan, and necessity for staying for completing the evaluation.   This document serves as a record of services personally performed by Leonor Hope, PA-C.  [facility] Emergency Department Physician Note  ED Course - HPI and Medical Decision Making  Chief Complaint: No chief complaint on file.   History is obtained from Patient and Relative HPI: This is a 78 y.o. male who presents to the emergency department with mass R chest wall. History of Present Illness This is an 78 year old male with a history of myasthenia gravis presenting with a painful, swollen mass on the right side.  Two weeks ago, the patient noticed a sudden onset of a painful, swollen mass on his right side. Initially, it appeared dark like a bruise but later turned reddish-purple. He was prescribed doxycycline 12 days ago, which helped reduce the fluid accumulation. However, the solid mass remains and is mobile. The patient reports no recent trauma to the area but recalls frequent falls in the past. The mass is extremely tender, causing severe pain at night, rated as 12 or 13 out of 10, which disrupts his sleep. He managed to sleep for 4 to 4.5 hours last night after taking half a pill of hydrocodone prescribed by Dr. Debarah. The pain is less severe during the day, although it can be triggered by certain movements. He has been applying CBD cream to the area and received lidocaine patches from the [facility] today. He also uses Salonpas patches. He has difficulty lying flat due to the pain. He has been advised to avoid standing up. He has been  informed that the mass is not cancerous but may be a cyst. He has been advised to undergo a contrast CT scan to determine the depth of the mass. He has been advised to discontinue Eliquis  and other blood thinners prior to the procedure.  The patient has myasthenia gravis and is having a 2.5-hour infusion tomorrow. He is supposed to have an ablation and a Watchman on 10/05/2024. He had a minor stroke on 10/16/2022 and has had no issues since.     MDM and Assessment: -Differential diagnosis includes, but not limited to: Lipoma, induration, abscess, malignancy  -Initial plan: Place patient on telemetry with continuous pulse oximeter reading and vital signs per emergency protocol and CT chest with contrast to evaluate right lateral chest wall mass  -Cardiac telemetry ordered for continuous cardiac and hemodynamic monitoring; reviewed telemetry showing Normal sinus rhythm without arrhythmia  -Patient presents with pain and mass appearance of the right lateral chest wall.  CBC and CMP are unremarkable.  CT was obtained that shows increased soft tissue density in the right lateral chest wall overlying the scapula, most likely represents subcutaneous posttraumatic change/hematoma, there is no evidence of an underlying fracture and no extravasation of IV contrast to suggest presence of active bleeding, no fluid to suggest incompletely coagulated products. - This is consistent with his exam, there is a indurated area that appears to be coagulated blood product and I would expect this to improve over the next few days, there is no abscess or collection that is amenable to drainage.  There was incidental aneurysmal  dilatation of the ascending aorta measuring at 4.6 cm which is consistent with previous measurements and will continue to be surveilled - He will continue to use pain medication prescribed by [facility] physician, we have added lidocaine patches as well    Medical Decision Making Problems  Addressed: Induration of skin: complicated acute illness or injury  Amount and/or Complexity of Data Reviewed Labs: ordered. Radiology: ordered.  Risk Prescription drug management.    Factors Impacting ED Encounter Complexity: Increase Complexity: Decision regarding hospitalization or escalation of hospital level care and Discussion regarding prescription drug management  Impression  Patient's presentation is most consistent with acute presentation with potential threat to life or bodily function. 1. Induration of skin    ED Disposition     ED Disposition  Discharge   Condition  Stable   Comment  --        Follow-up Jayson Ricardo Ill, MD [phone] [facility] [place] [address] [phone]     Review of Systems  A pertinent review of systems was obtained and was negative except as noted in the HPI and MDM. Physical Exam   ED Triage Vitals [09/03/24 1508]  Temp 97.2 F (36.2 C)  Heart Rate 77  Resp 15  BP 121/70  MAP (mmHg) 87  SpO2 96 %  O2 Device None (Room air)  O2 Flow Rate (L/min)   Weight 80.7 kg (178 lb)   Physical Exam  Constitutional Nursing notes reviewed Vital signs reviewed  HEENT No obvious trauma Supple without meningismus Pupils cross midline No scleral icterus or injection  Respiratory Effort normal CTAB No respiratory distress  CV Normal rate No pitting edema 2+ radial and DP pulses  Abdomen Soft Non-tender Non-distended No peritonitis  MSK Atraumatic No obvious deformity ROM appropriate  Skin Warm Dry   Neuro Awake and alert Pupils cross midline Moving all extremities  Psychiatric Mood and affect normal    Other pertinent exam findings per MDM-ED Course Procedures  Procedures

## 2024-09-03 NOTE — ED Triage Notes (Signed)
 Pt sent from [facility] clinic for a CT scan. Pt has right side knot on upper rib area that started 2 weeks ago. Increased pain. Currently on Doxycycline. No fevers.

## 2024-09-08 ENCOUNTER — Emergency Department (HOSPITAL_COMMUNITY)

## 2024-09-08 ENCOUNTER — Inpatient Hospital Stay (HOSPITAL_COMMUNITY)

## 2024-09-08 ENCOUNTER — Encounter (HOSPITAL_COMMUNITY): Payer: Self-pay

## 2024-09-08 ENCOUNTER — Inpatient Hospital Stay (HOSPITAL_COMMUNITY)
Admission: EM | Admit: 2024-09-08 | Discharge: 2024-10-01 | DRG: 064 | Disposition: E | Attending: Internal Medicine | Admitting: Internal Medicine

## 2024-09-08 ENCOUNTER — Other Ambulatory Visit: Payer: Self-pay

## 2024-09-08 DIAGNOSIS — M109 Gout, unspecified: Secondary | ICD-10-CM | POA: Diagnosis present

## 2024-09-08 DIAGNOSIS — H25819 Combined forms of age-related cataract, unspecified eye: Secondary | ICD-10-CM | POA: Insufficient documentation

## 2024-09-08 DIAGNOSIS — E639 Nutritional deficiency, unspecified: Secondary | ICD-10-CM | POA: Diagnosis not present

## 2024-09-08 DIAGNOSIS — I6389 Other cerebral infarction: Secondary | ICD-10-CM | POA: Diagnosis not present

## 2024-09-08 DIAGNOSIS — T45516A Underdosing of anticoagulants, initial encounter: Secondary | ICD-10-CM | POA: Diagnosis not present

## 2024-09-08 DIAGNOSIS — I10 Essential (primary) hypertension: Secondary | ICD-10-CM | POA: Diagnosis present

## 2024-09-08 DIAGNOSIS — K219 Gastro-esophageal reflux disease without esophagitis: Secondary | ICD-10-CM | POA: Diagnosis present

## 2024-09-08 DIAGNOSIS — R1312 Dysphagia, oropharyngeal phase: Secondary | ICD-10-CM | POA: Diagnosis present

## 2024-09-08 DIAGNOSIS — W19XXXA Unspecified fall, initial encounter: Secondary | ICD-10-CM | POA: Diagnosis present

## 2024-09-08 DIAGNOSIS — G9341 Metabolic encephalopathy: Secondary | ICD-10-CM | POA: Diagnosis present

## 2024-09-08 DIAGNOSIS — C61 Malignant neoplasm of prostate: Secondary | ICD-10-CM | POA: Diagnosis not present

## 2024-09-08 DIAGNOSIS — E872 Acidosis, unspecified: Secondary | ICD-10-CM | POA: Diagnosis present

## 2024-09-08 DIAGNOSIS — I2510 Atherosclerotic heart disease of native coronary artery without angina pectoris: Secondary | ICD-10-CM | POA: Insufficient documentation

## 2024-09-08 DIAGNOSIS — R531 Weakness: Principal | ICD-10-CM

## 2024-09-08 DIAGNOSIS — I4891 Unspecified atrial fibrillation: Secondary | ICD-10-CM | POA: Insufficient documentation

## 2024-09-08 DIAGNOSIS — Z7901 Long term (current) use of anticoagulants: Secondary | ICD-10-CM | POA: Diagnosis not present

## 2024-09-08 DIAGNOSIS — R296 Repeated falls: Secondary | ICD-10-CM

## 2024-09-08 DIAGNOSIS — I6340 Cerebral infarction due to embolism of unspecified cerebral artery: Secondary | ICD-10-CM | POA: Diagnosis present

## 2024-09-08 DIAGNOSIS — D6869 Other thrombophilia: Secondary | ICD-10-CM | POA: Diagnosis not present

## 2024-09-08 DIAGNOSIS — Y92009 Unspecified place in unspecified non-institutional (private) residence as the place of occurrence of the external cause: Secondary | ICD-10-CM | POA: Diagnosis not present

## 2024-09-08 DIAGNOSIS — N320 Bladder-neck obstruction: Secondary | ICD-10-CM | POA: Diagnosis present

## 2024-09-08 DIAGNOSIS — R569 Unspecified convulsions: Secondary | ICD-10-CM | POA: Diagnosis not present

## 2024-09-08 DIAGNOSIS — Z7189 Other specified counseling: Secondary | ICD-10-CM | POA: Diagnosis not present

## 2024-09-08 DIAGNOSIS — G7000 Myasthenia gravis without (acute) exacerbation: Secondary | ICD-10-CM | POA: Diagnosis present

## 2024-09-08 DIAGNOSIS — Z781 Physical restraint status: Secondary | ICD-10-CM | POA: Diagnosis not present

## 2024-09-08 DIAGNOSIS — Z515 Encounter for palliative care: Secondary | ICD-10-CM | POA: Diagnosis not present

## 2024-09-08 DIAGNOSIS — J690 Pneumonitis due to inhalation of food and vomit: Secondary | ICD-10-CM | POA: Diagnosis present

## 2024-09-08 DIAGNOSIS — I639 Cerebral infarction, unspecified: Secondary | ICD-10-CM | POA: Insufficient documentation

## 2024-09-08 DIAGNOSIS — I441 Atrioventricular block, second degree: Secondary | ICD-10-CM | POA: Diagnosis present

## 2024-09-08 DIAGNOSIS — Z711 Person with feared health complaint in whom no diagnosis is made: Secondary | ICD-10-CM | POA: Diagnosis not present

## 2024-09-08 DIAGNOSIS — Z9079 Acquired absence of other genital organ(s): Secondary | ICD-10-CM | POA: Diagnosis not present

## 2024-09-08 DIAGNOSIS — N390 Urinary tract infection, site not specified: Secondary | ICD-10-CM | POA: Diagnosis present

## 2024-09-08 DIAGNOSIS — E1165 Type 2 diabetes mellitus with hyperglycemia: Secondary | ICD-10-CM | POA: Diagnosis present

## 2024-09-08 DIAGNOSIS — E785 Hyperlipidemia, unspecified: Secondary | ICD-10-CM | POA: Diagnosis present

## 2024-09-08 DIAGNOSIS — Z8546 Personal history of malignant neoplasm of prostate: Secondary | ICD-10-CM

## 2024-09-08 DIAGNOSIS — Z66 Do not resuscitate: Secondary | ICD-10-CM | POA: Diagnosis present

## 2024-09-08 DIAGNOSIS — H903 Sensorineural hearing loss, bilateral: Secondary | ICD-10-CM | POA: Insufficient documentation

## 2024-09-08 DIAGNOSIS — Z794 Long term (current) use of insulin: Secondary | ICD-10-CM | POA: Diagnosis not present

## 2024-09-08 DIAGNOSIS — E119 Type 2 diabetes mellitus without complications: Secondary | ICD-10-CM

## 2024-09-08 DIAGNOSIS — R4182 Altered mental status, unspecified: Secondary | ICD-10-CM | POA: Diagnosis not present

## 2024-09-08 DIAGNOSIS — G7001 Myasthenia gravis with (acute) exacerbation: Secondary | ICD-10-CM | POA: Diagnosis not present

## 2024-09-08 DIAGNOSIS — E876 Hypokalemia: Secondary | ICD-10-CM | POA: Diagnosis present

## 2024-09-08 DIAGNOSIS — N179 Acute kidney failure, unspecified: Secondary | ICD-10-CM | POA: Diagnosis present

## 2024-09-08 DIAGNOSIS — I480 Paroxysmal atrial fibrillation: Secondary | ICD-10-CM | POA: Diagnosis present

## 2024-09-08 DIAGNOSIS — Z91148 Patient's other noncompliance with medication regimen for other reason: Secondary | ICD-10-CM | POA: Diagnosis not present

## 2024-09-08 LAB — URINALYSIS, W/ REFLEX TO CULTURE (INFECTION SUSPECTED)
Bilirubin Urine: NEGATIVE
Glucose, UA: >=500 mg/dL — AB
Ketones, ur: 20 mg/dL — AB
Nitrite: POSITIVE — AB
Protein, ur: NEGATIVE mg/dL
RBC / HPF: >50 RBC/hpf (ref 0–5)
Specific Gravity, Urine: 1.023 (ref 1.005–1.030)
pH: 5 (ref 5.0–8.0)

## 2024-09-08 LAB — I-STAT VENOUS BLOOD GAS, ED
Acid-base deficit: 3 mmol/L — ABNORMAL HIGH (ref 0.0–2.0)
Bicarbonate: 21.4 mmol/L (ref 20.0–28.0)
Calcium, Ion: 1.13 mmol/L — ABNORMAL LOW (ref 1.15–1.40)
HCT: 34 % — ABNORMAL LOW (ref 39.0–52.0)
Hemoglobin: 11.6 g/dL — ABNORMAL LOW (ref 13.0–17.0)
O2 Saturation: 84 %
Potassium: 3.7 mmol/L (ref 3.5–5.1)
Sodium: 140 mmol/L (ref 135–145)
TCO2: 22 mmol/L (ref 22–32)
pCO2, Ven: 34.6 mmHg — ABNORMAL LOW (ref 44–60)
pH, Ven: 7.4 (ref 7.25–7.43)
pO2, Ven: 48 mmHg — ABNORMAL HIGH (ref 32–45)

## 2024-09-08 LAB — CBC WITH DIFFERENTIAL/PLATELET
Abs Immature Granulocytes: 0.03 K/uL (ref 0.00–0.07)
Basophils Absolute: 0 K/uL (ref 0.0–0.1)
Basophils Relative: 0 %
Eosinophils Absolute: 0 K/uL (ref 0.0–0.5)
Eosinophils Relative: 0 %
HCT: 38.2 % — ABNORMAL LOW (ref 39.0–52.0)
Hemoglobin: 12.2 g/dL — ABNORMAL LOW (ref 13.0–17.0)
Immature Granulocytes: 0 %
Lymphocytes Relative: 6 %
Lymphs Abs: 0.4 K/uL — ABNORMAL LOW (ref 0.7–4.0)
MCH: 29 pg (ref 26.0–34.0)
MCHC: 31.9 g/dL (ref 30.0–36.0)
MCV: 90.7 fL (ref 80.0–100.0)
Monocytes Absolute: 0.5 K/uL (ref 0.1–1.0)
Monocytes Relative: 7 %
Neutro Abs: 6.2 K/uL (ref 1.7–7.7)
Neutrophils Relative %: 87 %
Platelets: 133 K/uL — ABNORMAL LOW (ref 150–400)
RBC: 4.21 MIL/uL — ABNORMAL LOW (ref 4.22–5.81)
RDW: 15 % (ref 11.5–15.5)
WBC: 7.2 K/uL (ref 4.0–10.5)
nRBC: 0 % (ref 0.0–0.2)

## 2024-09-08 LAB — COMPREHENSIVE METABOLIC PANEL WITH GFR
ALT: 21 U/L (ref 0–44)
AST: 16 U/L (ref 15–41)
Albumin: 3.5 g/dL (ref 3.5–5.0)
Alkaline Phosphatase: 39 U/L (ref 38–126)
Anion gap: 11 (ref 5–15)
BUN: 24 mg/dL — ABNORMAL HIGH (ref 8–23)
CO2: 25 mmol/L (ref 22–32)
Calcium: 9.2 mg/dL (ref 8.9–10.3)
Chloride: 104 mmol/L (ref 98–111)
Creatinine, Ser: 1.24 mg/dL (ref 0.61–1.24)
GFR, Estimated: 59 mL/min — ABNORMAL LOW (ref >60)
Glucose, Bld: 149 mg/dL — ABNORMAL HIGH (ref 70–99)
Potassium: 3.7 mmol/L (ref 3.5–5.1)
Sodium: 140 mmol/L (ref 135–145)
Total Bilirubin: 2.5 mg/dL — ABNORMAL HIGH (ref 0.0–1.2)
Total Protein: 5.4 g/dL — ABNORMAL LOW (ref 6.5–8.1)

## 2024-09-08 LAB — ETHANOL: Alcohol, Ethyl (B): <15 mg/dL (ref <15)

## 2024-09-08 LAB — AMMONIA: Ammonia: 25 umol/L (ref 9–35)

## 2024-09-08 LAB — I-STAT CG4 LACTIC ACID, ED: Lactic Acid, Venous: 1.3 mmol/L (ref 0.5–1.9)

## 2024-09-08 LAB — CBG MONITORING, ED
Glucose-Capillary: 162 mg/dL — ABNORMAL HIGH (ref 70–99)
Glucose-Capillary: 114 mg/dL — ABNORMAL HIGH (ref 70–99)

## 2024-09-08 LAB — TSH: TSH: 0.5 u[IU]/mL (ref 0.350–4.500)

## 2024-09-08 LAB — BRAIN NATRIURETIC PEPTIDE: B Natriuretic Peptide: 272.8 pg/mL — ABNORMAL HIGH (ref 0.0–100.0)

## 2024-09-08 MED ORDER — MELATONIN 5 MG PO TABS
5.0000 mg | ORAL_TABLET | Freq: Once | ORAL | Status: AC | PRN
  Administered 2024-09-09: 5 mg via ORAL
  Filled 2024-09-08: qty 1

## 2024-09-08 MED ORDER — ACETAMINOPHEN 325 MG PO TABS
650.0000 mg | ORAL_TABLET | Freq: Four times a day (QID) | ORAL | Status: AC | PRN
  Administered 2024-09-08 – 2024-09-13 (×4): 650 mg via ORAL
  Filled 2024-09-08 (×4): qty 2

## 2024-09-08 MED ORDER — APIXABAN 5 MG PO TABS
5.0000 mg | ORAL_TABLET | Freq: Two times a day (BID) | ORAL | Status: DC
  Administered 2024-09-08 – 2024-09-10 (×3): 5 mg via ORAL
  Filled 2024-09-08 (×3): qty 1

## 2024-09-08 MED ORDER — POLYETHYLENE GLYCOL 3350 17 G PO PACK: 17.0000 g | PACK | Freq: Every day | ORAL | Status: DC | PRN

## 2024-09-08 MED ORDER — ACETAMINOPHEN 650 MG RE SUPP
650.0000 mg | Freq: Four times a day (QID) | RECTAL | Status: AC | PRN
  Administered 2024-09-09: 650 mg via RECTAL
  Filled 2024-09-08: qty 1

## 2024-09-08 MED ORDER — PREDNISONE 5 MG PO TABS: 65.0000 mg | ORAL_TABLET | ORAL | Status: DC

## 2024-09-08 MED ORDER — SODIUM CHLORIDE 0.9 % IV BOLUS
1000.0000 mL | Freq: Once | INTRAVENOUS | Status: AC
  Administered 2024-09-08: 1000 mL via INTRAVENOUS

## 2024-09-08 MED ORDER — PANTOPRAZOLE SODIUM 40 MG PO TBEC: 40.0000 mg | DELAYED_RELEASE_TABLET | Freq: Every day | ORAL | Status: DC

## 2024-09-08 MED ORDER — GLUCERNA SHAKE PO LIQD
237.0000 mL | Freq: Three times a day (TID) | ORAL | Status: DC
  Filled 2024-09-08: qty 237

## 2024-09-08 MED ORDER — GLUCERNA PO LIQD: 237.0000 mL | Freq: Three times a day (TID) | ORAL | Status: DC

## 2024-09-08 MED ORDER — ALBUTEROL SULFATE (2.5 MG/3ML) 0.083% IN NEBU: 3.0000 mL | INHALATION_SOLUTION | Freq: Four times a day (QID) | RESPIRATORY_TRACT | Status: DC | PRN

## 2024-09-08 MED ORDER — POLYVINYL ALCOHOL 1.4 % OP SOLN
1.0000 [drp] | Freq: Every day | OPHTHALMIC | Status: DC | PRN
  Filled 2024-09-08: qty 15

## 2024-09-08 MED ORDER — PYRIDOSTIGMINE BROMIDE 60 MG PO TABS
60.0000 mg | ORAL_TABLET | Freq: Three times a day (TID) | ORAL | Status: DC
  Administered 2024-09-08 – 2024-09-10 (×5): 60 mg via ORAL
  Filled 2024-09-08 (×11): qty 1

## 2024-09-08 MED ORDER — SODIUM CHLORIDE 0.9% FLUSH
3.0000 mL | Freq: Two times a day (BID) | INTRAVENOUS | Status: DC
  Administered 2024-09-08 – 2024-09-14 (×8): 3 mL via INTRAVENOUS

## 2024-09-08 MED ORDER — PREDNISONE 5 MG PO TABS: 25.0000 mg | ORAL_TABLET | ORAL | Status: DC

## 2024-09-08 MED ORDER — INSULIN ASPART 100 UNIT/ML IJ SOLN
0.0000 [IU] | Freq: Three times a day (TID) | INTRAMUSCULAR | Status: DC
  Administered 2024-09-09: 1 [IU] via SUBCUTANEOUS
  Administered 2024-09-10 (×3): 2 [IU] via SUBCUTANEOUS
  Administered 2024-09-11: 5 [IU] via SUBCUTANEOUS
  Filled 2024-09-08 (×2): qty 2
  Filled 2024-09-08: qty 1
  Filled 2024-09-08: qty 2
  Filled 2024-09-08: qty 5
  Filled 2024-09-08: qty 2

## 2024-09-08 MED ORDER — KETOROLAC TROMETHAMINE 15 MG/ML IJ SOLN
15.0000 mg | Freq: Four times a day (QID) | INTRAMUSCULAR | Status: DC | PRN
  Administered 2024-09-08 – 2024-09-10 (×5): 15 mg via INTRAVENOUS
  Filled 2024-09-08 (×7): qty 1

## 2024-09-08 NOTE — ED Notes (Signed)
 Pt changed into new brief and new chucks after bowel movement

## 2024-09-08 NOTE — Consult Note (Signed)
 NEUROLOGY CONSULT NOTE   Date of service: September 08, 2024 Patient Name: Tyrone Newton MRN:  [id number] DOB:  30-Apr-1946 Chief Complaint: worsening weakness Requesting Provider: Emil Share, DO  History of Present Illness  Tyrone Newton is a 78 y.o. male with hx of of MG on Vyvgart and prednisone  at home, hypertension, presenting to the ED with complaints of progressive weakness with worsening over the last few days.  There has been no report of sudden onset of weakness but the wife reports that he has been having difficulty maintaining his own weight and had a fall without hitting his head yesterday which required them to call 911 to help him up to chair.  They have been in and out of the [facility] with complaints of generalized weakness that has been progressive over the past few weeks to months but have not really been able to get satisfactory answers and in the meantime, he has been getting worse in terms of his strength. On review of systems, she also notes that his voice has been sounding much lower, she also noted some discoloration in his urine.  She also reports weakness in his neck and a mass on the front of the neck which has been present for the past few days although he has been evaluated for goiter in the past but this is different than that according to wife.   ROS  Comprehensive ROS performed and pertinent positives documented in HPI   Past History   Past Medical History:  Diagnosis Date   Cancer (HCC)    GERD (gastroesophageal reflux disease)    Gout    HTN (hypertension) 07/31/2024   Myasthenia gravis (HCC)     Past Surgical History:  Procedure Laterality Date   CHOLECYSTECTOMY     PROSTATE SURGERY     due to cancer    Family History: History reviewed. No pertinent family history.  Social History  reports that he has never smoked. He has never used smokeless tobacco. He reports that he does not drink alcohol  and does not use drugs.  Allergies   Allergen Reactions   Ditropan Xl [Oxybutynin] Other (See Comments)   Nitrofurantoin Other (See Comments)    Medications  No current facility-administered medications for this encounter.  Current Outpatient Medications:    acetaminophen  (TYLENOL ) 500 MG tablet, Take 500 mg by mouth every 6 (six) hours as needed (for muscle soreness or pain)., Disp: , Rfl:    albuterol  (PROVENTIL  HFA;VENTOLIN  HFA) 108 (90 Base) MCG/ACT inhaler, Inhale 2 puffs into the lungs every 6 (six) hours as needed for wheezing or shortness of breath., Disp: , Rfl:    allopurinol (ZYLOPRIM) 300 MG tablet, Take 150 mg by mouth daily., Disp: , Rfl:    aspirin 81 MG chewable tablet, Chew 81 mg by mouth daily., Disp: , Rfl:    azithromycin  (ZITHROMAX  Z-PAK) 250 MG tablet, Take 1 tablet (250 mg total) by mouth daily., Disp: 4 tablet, Rfl: 0   benzonatate  (TESSALON ) 100 MG capsule, Take 100 mg by mouth 3 (three) times daily as needed for cough., Disp: , Rfl:    calcium carbonate (TUMS - DOSED IN MG ELEMENTAL CALCIUM) 500 MG chewable tablet, Chew 1 tablet by mouth daily., Disp: , Rfl:    chlorpheniramine-HYDROcodone (TUSSIONEX PENNKINETIC ER) 10-8 MG/5ML SUER, Take 5 mLs by mouth 2 (two) times daily., Disp: 100 mL, Rfl: 0   docusate sodium (COLACE) 100 MG capsule, Take 100 mg by mouth daily., Disp: , Rfl:  fexofenadine (ALLEGRA) 180 MG tablet, Take 180 mg by mouth daily after breakfast., Disp: , Rfl:    guaiFENesin-codeine (ROBITUSSIN AC) 100-10 MG/5ML syrup, Take 5 mLs by mouth 3 (three) times daily as needed for cough., Disp: , Rfl:    levalbuterol  (XOPENEX  HFA) 45 MCG/ACT inhaler, Inhale 2 puffs into the lungs every 6 (six) hours as needed for wheezing., Disp: 1 Inhaler, Rfl: 0   losartan (COZAAR) 25 MG tablet, Take 25 mg by mouth daily., Disp: , Rfl:    metFORMIN (GLUCOPHAGE) 500 MG tablet, Take 500 mg by mouth 2 (two) times daily with a meal., Disp: , Rfl:    montelukast (SINGULAIR) 10 MG tablet, Take 10 mg by mouth  daily., Disp: , Rfl:    omeprazole (PRILOSEC) 20 MG capsule, Take 20 mg by mouth daily., Disp: , Rfl:    predniSONE  (DELTASONE ) 20 MG tablet, Take 3 tablets (60 mg total) by mouth daily., Disp: 9 tablet, Rfl: 0  Vitals   Vitals:   2024-09-20 1400 2024/09/20 1415 09/20/24 1430 2024-09-20 1445  BP: 131/79  122/81 110/80  Pulse: 82  88 83  Resp: (!) 24  18 (!) 28  Temp:  (!) 97 F (36.1 C)    TempSrc:  Axillary    SpO2: 97%  99% 96%  Weight:      Height:        Body mass index is 21.87 kg/m.   Physical Exam   GEN: The patient is awake alert, very cheerful for the situation. HEENT: Normocephalic atraumatic, there is a mass noted on the front of the neck.  Neck flexor strength 4 -/5.  Neck extensors 4+/5. CVS: Regular rhythm Respiratory: Breathing well saturating normally on room air Neurological exam He is awake alert oriented x 3. His speech is very guttural and hypophonic No evidence of aphasia Normal attention concentration Cranial nerves II to XII: Pupils equal round react light, extraocular movements intact, visual fields full, no ptosis at rest, some amount of fatigable ptosis on sustained upward gaze, face symmetric, tongue and palate midline. Motor examination with no drift in the upper extremities but on confrontation testing, there is fatigable weakness.  Similar exam for the legs Sensation intact to light touch Coordination examination reveals no gross dysmetria   Labs/Imaging/Neurodiagnostic studies   CBC:  Recent Labs  Lab September 20, 2024 1117 September 20, 2024 1418  WBC 7.2  --   NEUTROABS 6.2  --   HGB 12.2* 11.6*  HCT 38.2* 34.0*  MCV 90.7  --   PLT 133*  --    Basic Metabolic Panel:  Lab Results  Component Value Date   NA 140 2024-09-20   K 3.7 Sep 20, 2024   CO2 25 09/20/2024   GLUCOSE 149 (H) 09/20/2024   BUN 24 (H) 20-Sep-2024   CREATININE 1.24 2024/09/20   CALCIUM 9.2 2024-09-20   GFRNONAA 59 (L) 09-20-24   GFRAA 58 (L) 01/05/2018     CT Head without  contrast(Personally reviewed): No acute intracranial abnormality  ASSESSMENT   Tyrone Newton is a 78 y.o. male with a history of myasthenia gravis on Vyvgart and prednisone  at home, history of hypertension, presenting for evaluation of worsening generalized weakness, guttural speech and difficulty ambulation over the period of past few days. With his history of myasthenia gravis and exam suggesting fatigable weakness, I am concerned for an MG exacerbation. That said, he is also had a stroke in the past and I want to first make sure that he does not have an infectious process  or a stroke that might be causing some component of the weakness, prior to committing him to IVIG treatment  Impression: Evaluate for MG exacerbation Evaluate for mimics of MG exacerbation  RECOMMENDATIONS  Check UA Check chest x-ray MRI of the brain without contrast to rule out a stroke If all of the above is unremarkable, IVIG x 5 days. Obtain records from outside hospitals if possible Plan discussed with patient's wife, son and Dr. Mannie in the ER. Neurology will follow  ______________________________________________________________________    Signed, Eligio Lav, MD [facility]

## 2024-09-08 NOTE — ED Triage Notes (Signed)
 Pt from home, has lump under right arm that's tender/painful for a couple of weeks. Wife found pt slumped over this morning in chair, confused, and slurred speech. Pt usually axox4. Hx of TIA. Denies numbness/tingling. Axox4 on arrival. VSS.

## 2024-09-08 NOTE — ED Provider Notes (Signed)
 [place] EMERGENCY DEPARTMENT AT [facility] Provider Note  CSN: [id number] Arrival date & time: 09/08/24 1019  Chief Complaint(s) Altered Mental Status  HPI Tyrone Newton is a 78 y.o. male with history of myasthenia gravis who is here today for weakness.  Patient is here with his wife provide history.  Wife reports that over the last 3 days, patient has had increasing generalized weakness, starting with his lower extremities.  Patient typically ambulates with a cane, however last few days has required a walker.  He has intermittently had some slurred speech.  They have had 3 ED visits prior to this at outside hospitals.  Patient gets infusions for his myasthenia gravis.  He is currently on prednisone .   Past Medical History Past Medical History:  Diagnosis Date   Cancer (HCC)    GERD (gastroesophageal reflux disease)    Gout    Myasthenia gravis (HCC)    Patient Active Problem List   Diagnosis Date Noted   Hyperglycemia 06/29/2024   Home Medication(s) Prior to Admission medications   Medication Sig Start Date End Date Taking? Authorizing Provider  acetaminophen  (TYLENOL ) 500 MG tablet Take 500 mg by mouth every 6 (six) hours as needed (for muscle soreness or pain).    [provider]  albuterol  (PROVENTIL  HFA;VENTOLIN  HFA) 108 (90 Base) MCG/ACT inhaler Inhale 2 puffs into the lungs every 6 (six) hours as needed for wheezing or shortness of breath.    [provider]  allopurinol (ZYLOPRIM) 300 MG tablet Take 150 mg by mouth daily.    [provider]  aspirin 81 MG chewable tablet Chew 81 mg by mouth daily.    [provider]  azithromycin  (ZITHROMAX  Z-PAK) 250 MG tablet Take 1 tablet (250 mg total) by mouth daily. 01/07/18   Odell Balls, PA-C  benzonatate  (TESSALON ) 100 MG capsule Take 100 mg by mouth 3 (three) times daily as needed for cough.    [provider]  calcium carbonate (TUMS - DOSED IN MG ELEMENTAL CALCIUM)  500 MG chewable tablet Chew 1 tablet by mouth daily.    [provider]  chlorpheniramine-HYDROcodone (TUSSIONEX PENNKINETIC ER) 10-8 MG/5ML SUER Take 5 mLs by mouth 2 (two) times daily. 01/07/18   Odell Balls, PA-C  docusate sodium (COLACE) 100 MG capsule Take 100 mg by mouth daily.    [provider]  fexofenadine (ALLEGRA) 180 MG tablet Take 180 mg by mouth daily after breakfast.    [provider]  guaiFENesin-codeine (ROBITUSSIN AC) 100-10 MG/5ML syrup Take 5 mLs by mouth 3 (three) times daily as needed for cough.    [provider]  levalbuterol  (XOPENEX  HFA) 45 MCG/ACT inhaler Inhale 2 puffs into the lungs every 6 (six) hours as needed for wheezing. 01/07/18   Odell Balls, PA-C  losartan (COZAAR) 25 MG tablet Take 25 mg by mouth daily.    [provider]  metFORMIN (GLUCOPHAGE) 500 MG tablet Take 500 mg by mouth 2 (two) times daily with a meal.    [provider]  montelukast (SINGULAIR) 10 MG tablet Take 10 mg by mouth daily.    [provider]  omeprazole (PRILOSEC) 20 MG capsule Take 20 mg by mouth daily.    [provider]  predniSONE  (DELTASONE ) 20 MG tablet Take 3 tablets (60 mg total) by mouth daily. 01/07/18   Odell Balls, PA-C  Past Surgical History Past Surgical History:  Procedure Laterality Date   CHOLECYSTECTOMY     PROSTATE SURGERY     due to cancer   Family History History reviewed. No pertinent family history.  Social History Social History   Tobacco Use   Smoking status: Never   Smokeless tobacco: Never  Vaping Use   Vaping status: Never Used  Substance Use Topics   Alcohol  use: No   Drug use: No   Allergies Ditropan xl [oxybutynin] and Nitrofurantoin  Review of Systems Review of Systems  Physical Exam Vital Signs  I have reviewed the triage vital  signs BP 131/79   Pulse 82   Temp (!) 97 F (36.1 C) (Axillary)   Resp (!) 24   Ht 6' 3 (1.905 m)   Wt 79.4 kg   SpO2 97%   BMI 21.87 kg/m   Physical Exam Vitals and nursing note reviewed.  Constitutional:      Appearance: He is not toxic-appearing.  HENT:     Head: Normocephalic.  Eyes:     Pupils: Pupils are equal, round, and reactive to light.  Cardiovascular:     Rate and Rhythm: Normal rate.  Pulmonary:     Effort: Pulmonary effort is normal.  Abdominal:     General: Abdomen is flat.     Palpations: Abdomen is soft.  Skin:    Findings: Bruising present.  Neurological:     Mental Status: He is alert.     Comments: Patient able to move all extremities, no numbness, no tingling, generalized weakness     ED Results and Treatments Labs (all labs ordered are listed, but only abnormal results are displayed) Labs Reviewed  COMPREHENSIVE METABOLIC PANEL WITH GFR - Abnormal; Notable for the following components:      Result Value   Glucose, Bld 149 (*)    BUN 24 (*)    Total Protein 5.4 (*)    Total Bilirubin 2.5 (*)    GFR, Estimated 59 (*)    All other components within normal limits  CBC WITH DIFFERENTIAL/PLATELET - Abnormal; Notable for the following components:   RBC 4.21 (*)    Hemoglobin 12.2 (*)    HCT 38.2 (*)    Platelets 133 (*)    Lymphs Abs 0.4 (*)    All other components within normal limits  I-STAT VENOUS BLOOD GAS, ED - Abnormal; Notable for the following components:   pCO2, Ven 34.6 (*)    pO2, Ven 48 (*)    Acid-base deficit 3.0 (*)    Calcium, Ion 1.13 (*)    HCT 34.0 (*)    Hemoglobin 11.6 (*)    All other components within normal limits  CULTURE, BLOOD (ROUTINE X 2)  CULTURE, BLOOD (ROUTINE X 2)  ETHANOL  URINALYSIS, ROUTINE W REFLEX MICROSCOPIC  AMMONIA  TSH  URINALYSIS, W/ REFLEX TO CULTURE (INFECTION SUSPECTED)  BRAIN NATRIURETIC PEPTIDE  I-STAT CG4 LACTIC ACID, ED  I-STAT CG4 LACTIC ACID, ED  Radiology DG Chest Portable 1 View Result Date: 09/08/2024 CLINICAL DATA:  Cough. EXAM: PORTABLE CHEST 1 VIEW COMPARISON:  10/03/2023. FINDINGS: The heart size and mediastinal contours are unchanged. Known bilateral calcified pleural plaques. No appreciable acute airspace consolidation. No pleural effusion or pneumothorax. No acute osseous abnormality. IMPRESSION: 1. No acute cardiopulmonary findings. 2. Known calcified bilateral pleural plaques, compatible with asbestos related pleural disease. Electronically Signed   By: Harrietta Sherry M.D.   On: 09/08/2024 13:18    Pertinent labs & imaging results that were available during my care of the patient were reviewed by me and considered in my medical decision making (see MDM for details).  Medications Ordered in ED Medications  sodium chloride  0.9 % bolus 1,000 mL (0 mLs Intravenous Stopped 09/08/24 1415)                                                                                                                                     Procedures Procedures  (including critical care time)  Medical Decision Making / ED Course   This patient presents to the ED for concern of weakness, this involves an extensive number of treatment options, and is a complaint that carries with it a high risk of complications and morbidity.  The differential diagnosis includes myasthenia gravis flare, CVA, consider sepsis.  MDM: Patient with normal vital signs, no fever.  With his history of prior stroke I am extending, I believe this is likely central neurologic process.  If this is CVA, patient outside of window for stroke activation.  Obtained CT imaging, currently waiting on read.  Patient has no respiratory distress, no hypoxia, speaking in complete sentences.  Patient's blood work that have available currently shows no leukocytosis, renal function normal, normal  electrolytes.  Did discuss patient with Dr. Voncile from neurology who agreed to see the patient in consultation.  Will admit patient to hospitalist service for generalized weakness, possible myasthenia flare.   Additional history obtained: -Additional history obtained from wife at bedside -External records from outside source obtained and reviewed including: Chart review including previous notes, labs, imaging, consultation notes   Lab Tests: -I ordered, reviewed, and interpreted labs.   The pertinent results include:   Labs Reviewed  COMPREHENSIVE METABOLIC PANEL WITH GFR - Abnormal; Notable for the following components:      Result Value   Glucose, Bld 149 (*)    BUN 24 (*)    Total Protein 5.4 (*)    Total Bilirubin 2.5 (*)    GFR, Estimated 59 (*)    All other components within normal limits  CBC WITH DIFFERENTIAL/PLATELET - Abnormal; Notable for the following components:   RBC 4.21 (*)    Hemoglobin 12.2 (*)    HCT 38.2 (*)    Platelets 133 (*)    Lymphs Abs 0.4 (*)    All other components within normal limits  I-STAT VENOUS BLOOD GAS, ED -  Abnormal; Notable for the following components:   pCO2, Ven 34.6 (*)    pO2, Ven 48 (*)    Acid-base deficit 3.0 (*)    Calcium, Ion 1.13 (*)    HCT 34.0 (*)    Hemoglobin 11.6 (*)    All other components within normal limits  CULTURE, BLOOD (ROUTINE X 2)  CULTURE, BLOOD (ROUTINE X 2)  ETHANOL  URINALYSIS, ROUTINE W REFLEX MICROSCOPIC  AMMONIA  TSH  URINALYSIS, W/ REFLEX TO CULTURE (INFECTION SUSPECTED)  BRAIN NATRIURETIC PEPTIDE  I-STAT CG4 LACTIC ACID, ED  I-STAT CG4 LACTIC ACID, ED      EKG   EKG Interpretation Date/Time:  Tuesday September 08 2024 10:49:42 EST Ventricular Rate:  81 PR Interval:    QRS Duration:  86 QT Interval:  374 QTC Calculation: 435 R Axis:   5  Text Interpretation: Atrial fibrillation Low voltage, precordial leads Confirmed by Mannie Pac [phone]) on 09/08/2024 11:43:21 AM          Imaging Studies ordered: I ordered imaging studies including chest x-ray, CT head I independently visualized and interpreted imaging. I agree with the radiologist interpretation   Medicines ordered and prescription drug management: Meds ordered this encounter  Medications   sodium chloride  0.9 % bolus 1,000 mL    -I have reviewed the patients home medicines and have made adjustments as needed  Cardiac Monitoring: The patient was maintained on a cardiac monitor.  I personally viewed and interpreted the cardiac monitored which showed an underlying rhythm of: Normal sinus rhythm  Social Determinants of Health:  Factors impacting patients care include: Multiple medical comorbidities including history of myasthenia gravis   Reevaluation: After the interventions noted above, I reevaluated the patient and found that they have :stayed the same  Co morbidities that complicate the patient evaluation  Past Medical History:  Diagnosis Date   Cancer (HCC)    GERD (gastroesophageal reflux disease)    Gout    Myasthenia gravis (HCC)       Final Clinical Impression(s) / ED Diagnoses Final diagnoses:  Generalized weakness     @PCDICTATION @    Mannie Pac T, DO 09/08/24 1425

## 2024-09-08 NOTE — ED Notes (Signed)
 Patient had another BM. Patient cleaned changed, barrier cream and pressure pad placed on patient

## 2024-09-08 NOTE — H&P (Signed)
 History and Physical   Tyrone Newton FMW:[id number] DOB: Aug 07, 1946 DOA: 09/08/2024  PCP: Milissa Savant, MD   Patient coming from: Home  Chief Complaint: Weakness  HPI: Tyrone Newton is a 78 y.o. male with medical history significant of hypertension, CVA, GERD, secondary AV block type I, CAD, atrial fibrillation, myasthenia gravis, cataract, prostate cancer status post prostatectomy, gout presenting with worsening weakness.  History provided with assistance of patient's wife.  Patient said 3 days of worsening weakness, described as generalized.  Has been noticed in the lower extremities due to needing more support to ambulate.  At baseline patient is able to ambulate with a cane but now is needing a walker.  Family also has noted some intermittent slurred speech.  They also report decreased p.o. intake and gradual, but significant, weight loss.  Patient appears not to have much of an appetite.  Patient has also had some generalized pain.  Patient does have history of myasthenia gravis and receives infusions for this most recently on 12/5.  Also currently taking pyridostigmine .  Chart review shows recent evaluation in the [facility] ED for a right sided mass.  Tender solid mobile lesion that was thought could be a cyst.  Had already been taking doxycycline for it.  Had improvement in erythema with with that.  CT done there showed changes consistent with posttraumatic change versus hematoma.  Patient denies fevers, chills, chest pain, shortness of breath, abdominal pain, constipation, diarrhea, nausea, vomiting.  ED Course: Vital signs in the ED notable for blood pressure in the 110s-140 systolic, respirate in the teens-20s.  Lab workup included CMP with BUN 24, glucose 149, protein 5.4, T. bili 2.5.  CBC with hemoglobin stable at 12.2, platelets 133.  Lactic acid normal.  BNP pending.  Urinalysis pending.  Ethanol level negative.  Ammonia level pending.  Blood cultures pending.  TSH pending.   VBG with normal pH and pCO2 34.6.  Chest x-ray showed no acute abnormality but showed known calcified plaque.  CT head showed no acute abnormality.  MRI brain pending.  Neurology consulted in the ED, concern for myasthenia gravis exacerbation, will continue to follow.  Recommending MRI to rule out CVA component.  Review of Systems: As per HPI otherwise all other systems reviewed and are negative.  Past Medical History:  Diagnosis Date   Cancer (HCC)    Diabetes (HCC) 09/08/2024   GERD (gastroesophageal reflux disease)    Gout    HTN (hypertension) 07/31/2024   Myasthenia gravis (HCC)     Past Surgical History:  Procedure Laterality Date   CHOLECYSTECTOMY     PROSTATE SURGERY     due to cancer    Social History  reports that he has never smoked. He has never used smokeless tobacco. He reports that he does not drink alcohol  and does not use drugs.  Allergies  Allergen Reactions   Ditropan Xl [Oxybutynin] Other (See Comments)    unknown   Nitrofurantoin Other (See Comments)    Unknown    History reviewed. No pertinent family history.  Prior to Admission medications   Medication Sig Start Date End Date Taking? Authorizing Provider  acetaminophen  (TYLENOL ) 500 MG tablet Take 500 mg by mouth every 6 (six) hours as needed (for muscle soreness or pain).    [provider]  albuterol  (PROVENTIL  HFA;VENTOLIN  HFA) 108 (90 Base) MCG/ACT inhaler Inhale 2 puffs into the lungs every 6 (six) hours as needed for wheezing or shortness of breath.    [provider]  allopurinol (ZYLOPRIM) 300 MG tablet Take 150 mg by mouth daily.    [provider]  aspirin 81 MG chewable tablet Chew 81 mg by mouth daily.    [provider]  azithromycin  (ZITHROMAX  Z-PAK) 250 MG tablet Take 1 tablet (250 mg total) by mouth daily. 01/07/18   Odell Balls, PA-C  benzonatate  (TESSALON ) 100 MG capsule Take 100 mg by mouth 3 (three) times daily as needed for cough.     [provider]  calcium carbonate (TUMS - DOSED IN MG ELEMENTAL CALCIUM) 500 MG chewable tablet Chew 1 tablet by mouth daily.    [provider]  chlorpheniramine-HYDROcodone (TUSSIONEX PENNKINETIC ER) 10-8 MG/5ML SUER Take 5 mLs by mouth 2 (two) times daily. 01/07/18   Odell Balls, PA-C  docusate sodium (COLACE) 100 MG capsule Take 100 mg by mouth daily.    [provider]  fexofenadine (ALLEGRA) 180 MG tablet Take 180 mg by mouth daily after breakfast.    [provider]  guaiFENesin-codeine (ROBITUSSIN AC) 100-10 MG/5ML syrup Take 5 mLs by mouth 3 (three) times daily as needed for cough.    [provider]  levalbuterol  (XOPENEX  HFA) 45 MCG/ACT inhaler Inhale 2 puffs into the lungs every 6 (six) hours as needed for wheezing. 01/07/18   Odell Balls, PA-C  losartan (COZAAR) 25 MG tablet Take 25 mg by mouth daily.    [provider]  metFORMIN (GLUCOPHAGE) 500 MG tablet Take 500 mg by mouth 2 (two) times daily with a meal.    [provider]  montelukast (SINGULAIR) 10 MG tablet Take 10 mg by mouth daily.    [provider]  omeprazole (PRILOSEC) 20 MG capsule Take 20 mg by mouth daily.    [provider]  predniSONE  (DELTASONE ) 20 MG tablet Take 3 tablets (60 mg total) by mouth daily. 01/07/18   Odell Balls, PA-C    Physical Exam: Vitals:   09/08/24 1600 09/08/24 1615 09/08/24 1620 09/08/24 1630  BP: 123/66 (!) 112/91  131/70  Pulse: 80 86  87  Resp: (!) 22 (!) 30    Temp:   (!) 97.1 F (36.2 C)   TempSrc:   Axillary   SpO2: 97% 99%  98%  Weight:      Height:        Physical Exam Constitutional:      General: He is not in acute distress.    Appearance: Normal appearance.  HENT:     Head: Normocephalic and atraumatic.     Mouth/Throat:     Mouth: Mucous membranes are moist.     Pharynx: Oropharynx is clear.  Eyes:     Extraocular Movements: Extraocular movements intact.     Pupils: Pupils are  equal, round, and reactive to light.  Cardiovascular:     Rate and Rhythm: Normal rate and regular rhythm.     Pulses: Normal pulses.     Heart sounds: Normal heart sounds.  Pulmonary:     Effort: Pulmonary effort is normal. No respiratory distress.     Breath sounds: Normal breath sounds.  Abdominal:     General: Bowel sounds are normal. There is no distension.     Palpations: Abdomen is soft.     Tenderness: There is no abdominal tenderness.  Musculoskeletal:        General: No swelling or deformity.  Skin:    General: Skin is warm and dry.  Neurological:     Comments: Patient is awake, alert, oriented Strength and  sensation grossly intact but generally weak.      Labs on Admission: I have personally reviewed following labs and imaging studies  CBC: Recent Labs  Lab 09/08/24 1117 09/08/24 1418  WBC 7.2  --   NEUTROABS 6.2  --   HGB 12.2* 11.6*  HCT 38.2* 34.0*  MCV 90.7  --   PLT 133*  --     Basic Metabolic Panel: Recent Labs  Lab 09/08/24 1117 09/08/24 1418  NA 140 140  K 3.7 3.7  CL 104  --   CO2 25  --   GLUCOSE 149*  --   BUN 24*  --   CREATININE 1.24  --   CALCIUM 9.2  --     GFR: Estimated Creatinine Clearance: 53.4 mL/min (by C-G formula based on SCr of 1.24 mg/dL).  Liver Function Tests: Recent Labs  Lab 09/08/24 1117  AST 16  ALT 21  ALKPHOS 39  BILITOT 2.5*  PROT 5.4*  ALBUMIN 3.5    Urine analysis:    Component Value Date/Time   COLORURINE YELLOW 06/29/2024 1815   APPEARANCEUR CLEAR 06/29/2024 1815   LABSPEC 1.010 06/29/2024 1815   PHURINE 5.5 06/29/2024 1815   GLUCOSEU >=500 (A) 06/29/2024 1815   HGBUR SMALL (A) 06/29/2024 1815   BILIRUBINUR NEGATIVE 06/29/2024 1815   KETONESUR NEGATIVE 06/29/2024 1815   PROTEINUR NEGATIVE 06/29/2024 1815   UROBILINOGEN 1.0 07/28/2013 2109   NITRITE POSITIVE (A) 06/29/2024 1815   LEUKOCYTESUR TRACE (A) 06/29/2024 1815    Radiological Exams on Admission: CT Head Wo Contrast Result  Date: 09/08/2024 EXAM: CT HEAD WITHOUT 09/08/2024 01:50:57 PM TECHNIQUE: CT of the head was performed without the administration of intravenous contrast. Automated exposure control, iterative reconstruction, and/or weight based adjustment of the mA/kV was utilized to reduce the radiation dose to as low as reasonably achievable. COMPARISON: None available. CLINICAL HISTORY: Transient ischemic attack (TIA) FINDINGS: BRAIN AND VENTRICLES: No acute intracranial hemorrhage. No mass effect or midline shift. No extra-axial fluid collection. No evidence of acute infarct. No hydrocephalus. Chronic ischemic white matter changes. Atherosclerotic calcifications in cavernous internal carotid arteries. ORBITS: Right lens replacement. SINUSES AND MASTOIDS: No acute abnormality. SOFT TISSUES AND SKULL: No acute skull fracture. No acute soft tissue abnormality. IMPRESSION: 1. No acute intracranial abnormality. 2. Chronic ischemic white matter changes. Electronically signed by: Franky Stanford MD 09/08/2024 02:59 PM EST RP Workstation: [id number]   DG Chest Portable 1 View Result Date: 09/08/2024 CLINICAL DATA:  Cough. EXAM: PORTABLE CHEST 1 VIEW COMPARISON:  10/03/2023. FINDINGS: The heart size and mediastinal contours are unchanged. Known bilateral calcified pleural plaques. No appreciable acute airspace consolidation. No pleural effusion or pneumothorax. No acute osseous abnormality. IMPRESSION: 1. No acute cardiopulmonary findings. 2. Known calcified bilateral pleural plaques, compatible with asbestos related pleural disease. Electronically Signed   By: Harrietta Sherry M.D.   On: 09/08/2024 13:18   EKG: Independently reviewed.  Atrial fibrillation at 81 beats minute.  Nonspecific T wave changes.  Low voltage in multiple leads.  Assessment/Plan Principal Problem:   Weakness Active Problems:   Myasthenia gravis (HCC)   Mobitz type 1 second degree atrioventricular block   HTN (hypertension)   H/O prostatectomy   H/O  prostate cancer   Gout   GERD (gastroesophageal reflux disease)   Frequent falls   Anticoagulated on Eliquis    Atrial fibrillation (HCC)   Diabetes (HCC)   Progressive weakness Suspected myasthenia gravis flare > Patient presenting with progressive weakness for the past several days.  Started  in the lower extremities and has been ascending. > Known history of myasthenia gravis on infusions, per chart last infusion was 12/5.  Also taking pyridostigmine  and prednisone .  > Concerning for myasthenia flare.  Neurology consulted recommending MRI to rule out other components and they will see the patient. - Monitor on progressive unit - Appreciate neurology recommendations and assistance - Anticipate IVIG - Continue with pyridostigmine  and prednisone   - Neurochecks - NIF - Supportive care  Anterior neck swelling > TSH ordered and is pending. - Thyroid ultrasound  Right sided mass > Recent CT showed likely posttraumatic change versus hematoma.  Had some surrounding erythema which improved with outpatient doxycycline. - Noted  Decreased p.o. intake - Will give nutritional supplements in addition to diet  Hypertension - No longer on medication for this  Diabetes - SSI  History of CVA - Continue ASA, Eliquis  - Hold atorvastatin for now  Atrial fibrillation > New onset in July of this year per notes. - Continue with Eliquis   CAD - Continue Eliquis  - Holding Atorvastatin  Prostate cancer > Status post prostatectomy - Noted  DVT prophylaxis: Eliquis  Code Status:   Full Family Communication:  Updated at bedside Disposition Plan:   Patient is from:  Home  Anticipated DC to:  Home  Anticipated DC date:  2 to 7 days  Anticipated DC barriers: None  Consults called:  Neurology Admission status:  Inpatient, progressive  Severity of Illness: The appropriate patient status for this patient is INPATIENT. Inpatient status is judged to be reasonable and necessary in order to  provide the required intensity of service to ensure the patient's safety. The patient's presenting symptoms, physical exam findings, and initial radiographic and laboratory data in the context of their chronic comorbidities is felt to place them at high risk for further clinical deterioration. Furthermore, it is not anticipated that the patient will be medically stable for discharge from the hospital within 2 midnights of admission.   * I certify that at the point of admission it is my clinical judgment that the patient will require inpatient hospital care spanning beyond 2 midnights from the point of admission due to high intensity of service, high risk for further deterioration and high frequency of surveillance required.Tyrone Marsa KATHEE Seena MD [facility]  How to contact the [facility] Attending or Consulting provider 7A - 7P or covering provider during after hours 7P -7A, for this patient?   Check the care team in [facility] and look for a) attending/consulting [facility] provider listed and b) the [facility] team listed Log into [email or web address] and use [facility] universal password to access. If you do not have the password, please contact the hospital operator. Locate the [facility] provider you are looking for under [facility] and page to a number that you can be directly reached. If you still have difficulty reaching the provider, please page the [facility] (Director on Call) for the Hospitalists listed on amion for assistance.  09/08/2024, 4:50 PM

## 2024-09-08 NOTE — ED Provider Notes (Signed)
 Received patient in turnover from Dr. Mannie.  Please see their note for further details of Hx, PE.  Briefly patient is a 78 y.o. male with a Altered Mental Status .  Increasing weakness over the past 3 days or so.  Discussed with neuro, plan for admission for stroke vs MG flair.    Emil Share, DO 09/08/24 1606

## 2024-09-08 NOTE — ED Notes (Signed)
 Unable to collect labs, stuck patient twice. RN notified. KIT

## 2024-09-08 NOTE — Progress Notes (Signed)
 On best of three attempts pt achieved:  NIF: -18 FVC: 2.16 FEV1: 1.56 FEV1/FVC: 0.72 PEF: 4.72  Good effort with all attempts.

## 2024-09-09 ENCOUNTER — Inpatient Hospital Stay (HOSPITAL_COMMUNITY)

## 2024-09-09 DIAGNOSIS — I6389 Other cerebral infarction: Secondary | ICD-10-CM | POA: Diagnosis not present

## 2024-09-09 LAB — COMPREHENSIVE METABOLIC PANEL WITH GFR
ALT: 15 U/L (ref 0–44)
AST: 19 U/L (ref 15–41)
Albumin: 2.6 g/dL — ABNORMAL LOW (ref 3.5–5.0)
Alkaline Phosphatase: 34 U/L — ABNORMAL LOW (ref 38–126)
Anion gap: 13 (ref 5–15)
BUN: 24 mg/dL — ABNORMAL HIGH (ref 8–23)
CO2: 14 mmol/L — ABNORMAL LOW (ref 22–32)
Calcium: 7.3 mg/dL — ABNORMAL LOW (ref 8.9–10.3)
Chloride: 116 mmol/L — ABNORMAL HIGH (ref 98–111)
Creatinine, Ser: 1.2 mg/dL (ref 0.61–1.24)
GFR, Estimated: >60 mL/min (ref >60)
Glucose, Bld: 80 mg/dL (ref 70–99)
Potassium: 3.3 mmol/L — ABNORMAL LOW (ref 3.5–5.1)
Sodium: 143 mmol/L (ref 135–145)
Total Bilirubin: 1.9 mg/dL — ABNORMAL HIGH (ref 0.0–1.2)
Total Protein: 4.3 g/dL — ABNORMAL LOW (ref 6.5–8.1)

## 2024-09-09 LAB — GLUCOSE, CAPILLARY
Glucose-Capillary: 99 mg/dL (ref 70–99)
Glucose-Capillary: 105 mg/dL — ABNORMAL HIGH (ref 70–99)
Glucose-Capillary: 96 mg/dL (ref 70–99)

## 2024-09-09 LAB — LIPID PANEL
Cholesterol: 82 mg/dL (ref 0–200)
HDL: 34 mg/dL — ABNORMAL LOW (ref >40)
LDL Cholesterol: 31 mg/dL (ref 0–99)
Total CHOL/HDL Ratio: 2.4 ratio
Triglycerides: 83 mg/dL (ref <150)
VLDL: 17 mg/dL (ref 0–40)

## 2024-09-09 LAB — ECHOCARDIOGRAM COMPLETE
Area-P 1/2: 4.38 cm2
Calc EF: 63.8 %
Height: 75 in
S' Lateral: 3 cm
Single Plane A2C EF: 60.8 %
Single Plane A4C EF: 66.4 %
Weight: 2800 [oz_av]

## 2024-09-09 LAB — CBC
HCT: 39.7 % (ref 39.0–52.0)
Hemoglobin: 12 g/dL — ABNORMAL LOW (ref 13.0–17.0)
MCH: 29.4 pg (ref 26.0–34.0)
MCHC: 30.2 g/dL (ref 30.0–36.0)
MCV: 97.3 fL (ref 80.0–100.0)
Platelets: 117 K/uL — ABNORMAL LOW (ref 150–400)
RBC: 4.08 MIL/uL — ABNORMAL LOW (ref 4.22–5.81)
RDW: 14.8 % (ref 11.5–15.5)
WBC: 6.8 K/uL (ref 4.0–10.5)
nRBC: 0 % (ref 0.0–0.2)

## 2024-09-09 LAB — HEMOGLOBIN A1C
Hgb A1c MFr Bld: 10.7 % — ABNORMAL HIGH (ref 4.8–5.6)
Mean Plasma Glucose: 260.39 mg/dL

## 2024-09-09 LAB — CBG MONITORING, ED: Glucose-Capillary: 123 mg/dL — ABNORMAL HIGH (ref 70–99)

## 2024-09-09 MED ORDER — POTASSIUM CHLORIDE 20 MEQ PO PACK
40.0000 meq | PACK | Freq: Once | ORAL | Status: DC
  Filled 2024-09-09: qty 2

## 2024-09-09 MED ORDER — SODIUM CHLORIDE 0.9 % IV SOLN
1.0000 g | INTRAVENOUS | Status: DC
  Administered 2024-09-09: 1 g via INTRAVENOUS
  Filled 2024-09-09: qty 10

## 2024-09-09 MED ORDER — IOHEXOL 350 MG/ML SOLN: 75.0000 mL | Freq: Once | INTRAVENOUS | Status: DC | PRN

## 2024-09-09 MED ORDER — ZIPRASIDONE MESYLATE 20 MG IM SOLR
20.0000 mg | Freq: Once | INTRAMUSCULAR | Status: AC | PRN
  Administered 2024-09-09: 20 mg via INTRAMUSCULAR
  Filled 2024-09-09: qty 20

## 2024-09-09 MED ORDER — HALOPERIDOL LACTATE 5 MG/ML IJ SOLN
1.0000 mg | Freq: Four times a day (QID) | INTRAMUSCULAR | Status: DC | PRN
  Administered 2024-09-09 – 2024-09-10 (×2): 1 mg via INTRAVENOUS
  Filled 2024-09-09 (×2): qty 1

## 2024-09-09 MED ORDER — POTASSIUM CHLORIDE 10 MEQ/100ML IV SOLN
10.0000 meq | INTRAVENOUS | Status: AC
  Administered 2024-09-09 (×3): 10 meq via INTRAVENOUS
  Filled 2024-09-09 (×3): qty 100

## 2024-09-09 MED ORDER — STROKE: EARLY STAGES OF RECOVERY BOOK
Freq: Once | Status: AC
  Filled 2024-09-09: qty 1

## 2024-09-09 MED ORDER — TRAZODONE HCL 50 MG PO TABS
100.0000 mg | ORAL_TABLET | Freq: Once | ORAL | Status: AC
  Administered 2024-09-09: 100 mg via ORAL
  Filled 2024-09-09: qty 2

## 2024-09-09 NOTE — Consult Note (Signed)
 Tyrone Newton 1945/11/25  [id number].    Requesting MD: Dr. Sigurd Newton  Chief Complaint/Reason for Consult: Chest wall mass  HPI: Tyrone Newton is a 78 y.o. male with a hx of A. Fib on Eliquis  (LD 12/9 PM), myasthenia gravis on Vyvgart and prednisone  at home, HTN, CAD, CVA who we were asked to see for chest wall mass.   Patient presented to the ED 12/9 with weakness and was found to have acute CVA w/ Numerous bilateral infarcts as well as an UTI. Prior to hospitalization he was seen at outside hospital ED for 2 week hx of R chest wall mass for which he was taking doxy for and had a CT scan done showing increased soft tissue density in the right lateral chest wall overlying the scapula that was felt to likely represents subcutaneous posttraumatic change/hematoma with no evidence for an underlying fracture or extravasation of IV contrast to suggest the presence of active bleeding at the time of the study. No report of recent trauma but does have frequent falls. He reports pain to the area. He is currently HDS with normal wbc. No hx of prior surgeries to the area.   ROS: ROS As above, see hpi  History reviewed. No pertinent family history.  Past Medical History:  Diagnosis Date   Cancer (HCC)    Diabetes (HCC) 09/08/2024   GERD (gastroesophageal reflux disease)    Gout    HTN (hypertension) 07/31/2024   Myasthenia gravis (HCC)     Past Surgical History:  Procedure Laterality Date   CHOLECYSTECTOMY     PROSTATE SURGERY     due to cancer    Social History:  reports that he has never smoked. He has never used smokeless tobacco. He reports that he does not drink alcohol  and does not use drugs.  Allergies:  Allergies  Allergen Reactions   Ditropan Xl [Oxybutynin] Other (See Comments)    unknown   Nitrofurantoin Other (See Comments)    Unknown     Medications Prior to Admission  Medication Sig Dispense Refill   acetaminophen  (TYLENOL ) 500 MG tablet Take 500 mg by  mouth every 6 (six) hours as needed (for muscle soreness or pain).     apixaban  (ELIQUIS ) 5 MG TABS tablet Take 5 mg by mouth 2 (two) times daily.     atorvastatin (LIPITOR) 40 MG tablet Take 40 mg by mouth every evening.     b complex vitamins capsule Take 1 capsule by mouth daily.     benzonatate  (TESSALON ) 100 MG capsule Take 100 mg by mouth 3 (three) times daily as needed for cough.     Carboxymethylcellulose Sod PF 0.5 % SOLN Place 1 drop into both eyes daily as needed (dry eyes).     clotrimazole (MYCELEX) 10 MG troche Take 10 mg by mouth daily as needed (for thursh).     docusate sodium (COLACE) 100 MG capsule Take 100 mg by mouth daily.     Empagliflozin-metFORMIN HCl ER 12.01-999 MG TB24 Take 1 tablet by mouth daily.     ferrous gluconate (FERGON) 324 MG tablet Take 324 mg by mouth See admin instructions. Take one tablet by mouth two times a week     fluticasone (FLONASE) 50 MCG/ACT nasal spray Place 1 spray into both nostrils daily as needed for allergies or rhinitis.     guaiFENesin-codeine (ROBITUSSIN AC) 100-10 MG/5ML syrup Take 5 mLs by mouth 3 (three) times daily as needed for cough.  Multiple Vitamin (MULTIVITAMIN WITH MINERALS) TABS tablet Take 1 tablet by mouth daily.     nystatin (MYCOSTATIN) 100000 UNIT/ML suspension Take 5 mLs by mouth daily as needed (Thrush).     omeprazole (PRILOSEC) 20 MG capsule Take 20 mg by mouth daily.     oxyCODONE (OXY IR/ROXICODONE) 5 MG immediate release tablet Take 5 mg by mouth every 6 (six) hours as needed for moderate pain (pain score 4-6).     predniSONE  (DELTASONE ) 20 MG tablet Take 3 tablets (60 mg total) by mouth daily. (Patient taking differently: Take 25-65 mg by mouth See admin instructions. Take 25 mg every day except on Fridays takes 65 mg) 9 tablet 0   pyridostigmine  (MESTINON ) 60 MG tablet Take 60 mg by mouth 3 (three) times daily.     tretinoin (RETIN-A) 0.05 % cream Apply 1 Application topically daily as needed (for rash).      albuterol  (PROVENTIL  HFA;VENTOLIN  HFA) 108 (90 Base) MCG/ACT inhaler Inhale 2 puffs into the lungs every 6 (six) hours as needed for wheezing or shortness of breath.     azithromycin  (ZITHROMAX  Z-PAK) 250 MG tablet Take 1 tablet (250 mg total) by mouth daily. (Patient not taking: Reported on 09/08/2024) 4 tablet 0     Physical Exam: Exam performed in conjunction with Dr. Vernetta Blood pressure 126/73, pulse 80, temperature 97.7 F (36.5 C), temperature source Oral, resp. rate (!) 25, height 6' 3 (1.905 m), weight 79.4 kg, SpO2 98%. General: Pleasant confused elderly male who is laying in bed in NAD. Significant other at bedside. HEENT: Head is normocephalic, atraumatic.  Sclera are non-icteric.  Heart: Regular rate Lungs: Respiratory effort nonlabored Chest wall: Right sided thoracic hematoma near scapula that is firm and tender to palpation. Ecchymosis noted. No concern of cellulitis.  MS: No BUE or BLE edema Skin: Warm and dry.   Results for orders placed or performed during the hospital encounter of 09/08/24 (from the past 48 hours)  Comprehensive metabolic panel     Status: Abnormal   Collection Time: 09/08/24 11:17 AM  Result Value Ref Range   Sodium 140 135 - 145 mmol/L   Potassium 3.7 3.5 - 5.1 mmol/L   Chloride 104 98 - 111 mmol/L   CO2 25 22 - 32 mmol/L   Glucose, Bld 149 (H) 70 - 99 mg/dL    Comment: Glucose reference range applies only to samples taken after fasting for at least 8 hours.   BUN 24 (H) 8 - 23 mg/dL   Creatinine, Ser 8.75 0.61 - 1.24 mg/dL   Calcium 9.2 8.9 - 89.6 mg/dL   Total Protein 5.4 (L) 6.5 - 8.1 g/dL   Albumin 3.5 3.5 - 5.0 g/dL   AST 16 15 - 41 U/L   ALT 21 0 - 44 U/L   Alkaline Phosphatase 39 38 - 126 U/L   Total Bilirubin 2.5 (H) 0.0 - 1.2 mg/dL   GFR, Estimated 59 (L) >60 mL/min    Comment: (NOTE) Calculated using the CKD-EPI Creatinine Equation (2021)    Anion gap 11 5 - 15    Comment: Performed at [facility], [address], [place], [place] [address]  CBC with Differential     Status: Abnormal   Collection Time: 09/08/24 11:17 AM  Result Value Ref Range   WBC 7.2 4.0 - 10.5 K/uL   RBC 4.21 (L) 4.22 - 5.81 MIL/uL   Hemoglobin 12.2 (L) 13.0 - 17.0 g/dL   HCT 61.7 (L) 60.9 -  52.0 %   MCV 90.7 80.0 - 100.0 fL   MCH 29.0 26.0 - 34.0 pg   MCHC 31.9 30.0 - 36.0 g/dL   RDW 84.9 88.4 - 84.4 %   Platelets 133 (L) 150 - 400 K/uL   nRBC 0.0 0.0 - 0.2 %   Neutrophils Relative % 87 %   Neutro Abs 6.2 1.7 - 7.7 K/uL   Lymphocytes Relative 6 %   Lymphs Abs 0.4 (L) 0.7 - 4.0 K/uL   Monocytes Relative 7 %   Monocytes Absolute 0.5 0.1 - 1.0 K/uL   Eosinophils Relative 0 %   Eosinophils Absolute 0.0 0.0 - 0.5 K/uL   Basophils Relative 0 %   Basophils Absolute 0.0 0.0 - 0.1 K/uL   Immature Granulocytes 0 %   Abs Immature Granulocytes 0.03 0.00 - 0.07 K/uL    Comment: Performed at [facility], [address] [place], [place] [address]  Ethanol     Status: None   Collection Time: 09/08/24 11:17 AM  Result Value Ref Range   Alcohol , Ethyl (B) <15 <15 mg/dL    Comment: (NOTE) For medical purposes only. Performed at [facility], [address], [place], [place] [address]   Ammonia     Status: None   Collection Time: 09/08/24  2:11 PM  Result Value Ref Range   Ammonia 25 9 - 35 umol/L    Comment: Performed at [facility], [address] [place], [place] [address]  TSH     Status: None   Collection Time: 09/08/24  2:11 PM  Result Value Ref Range   TSH 0.500 0.350 - 4.500 uIU/mL    Comment: Performed by a 3rd Generation assay with a functional sensitivity of <=0.01 uIU/mL. Performed at [facility], [address], [place], [place] [address]   Brain natriuretic peptide     Status: Abnormal   Collection Time: 09/08/24  2:11 PM  Result Value Ref Range   B Natriuretic Peptide 272.8 (H) 0.0 - 100.0 pg/mL    Comment: Performed at [facility], [address], [place], [place]  [address]  I-Stat venous blood gas, [facility] ED, [facility], [facility])     Status: Abnormal   Collection Time: 09/08/24  2:18 PM  Result Value Ref Range   pH, Ven 7.400 7.25 - 7.43   pCO2, Ven 34.6 (L) 44 - 60 mmHg   pO2, Ven 48 (H) 32 - 45 mmHg   Bicarbonate 21.4 20.0 - 28.0 mmol/L   TCO2 22 22 - 32 mmol/L   O2 Saturation 84 %   Acid-base deficit 3.0 (H) 0.0 - 2.0 mmol/L   Sodium 140 135 - 145 mmol/L   Potassium 3.7 3.5 - 5.1 mmol/L   Calcium, Ion 1.13 (L) 1.15 - 1.40 mmol/L   HCT 34.0 (L) 39.0 - 52.0 %   Hemoglobin 11.6 (L) 13.0 - 17.0 g/dL   Sample type VENOUS   I-Stat CG4 Lactic Acid     Status: None   Collection Time: 09/08/24  2:19 PM  Result Value Ref Range   Lactic Acid, Venous 1.3 0.5 - 1.9 mmol/L  Urinalysis, w/ Reflex to Culture (Infection Suspected) -Urine, Clean Catch     Status: Abnormal   Collection Time: 09/08/24  3:00 PM  Result Value Ref Range   Specimen Source URINE, CLEAN CATCH    Color, Urine AMBER (A) YELLOW    Comment: BIOCHEMICALS MAY BE AFFECTED BY COLOR   APPearance  CLOUDY (A) CLEAR   Specific Gravity, Urine 1.023 1.005 - 1.030   pH 5.0 5.0 - 8.0   Glucose, UA >=500 (A) NEGATIVE mg/dL   Hgb urine dipstick LARGE (A) NEGATIVE   Bilirubin Urine NEGATIVE NEGATIVE   Ketones, ur 20 (A) NEGATIVE mg/dL   Protein, ur NEGATIVE NEGATIVE mg/dL   Nitrite POSITIVE (A) NEGATIVE   Leukocytes,Ua SMALL (A) NEGATIVE   RBC / HPF >50 0 - 5 RBC/hpf   WBC, UA 21-50 0 - 5 WBC/hpf    Comment:        Reflex urine culture not performed if WBC <=10, OR if Squamous epithelial cells >5. If Squamous epithelial cells >5 suggest recollection.    Bacteria, UA MANY (A) NONE SEEN   Squamous Epithelial / HPF 0-5 0 - 5 /HPF   Mucus PRESENT     Comment: Performed at [facility], [address], [place], [place] [address]  Blood culture (routine x 2)     Status: None (Preliminary result)   Collection Time: 09/08/24  3:03 PM   Specimen: BLOOD  Result Value Ref Range   Specimen Description  BLOOD SITE NOT SPECIFIED    Special Requests      BOTTLES DRAWN AEROBIC AND ANAEROBIC Blood Culture adequate volume   Culture      NO GROWTH < 24 HOURS Performed at [facility], [address] [place], [place] [address]    Report Status PENDING   Blood culture (routine x 2)     Status: None (Preliminary result)   Collection Time: 09/08/24  3:21 PM   Specimen: BLOOD  Result Value Ref Range   Specimen Description BLOOD SITE NOT SPECIFIED    Special Requests      BOTTLES DRAWN AEROBIC ONLY Blood Culture results may not be optimal due to an inadequate volume of blood received in culture bottles   Culture      NO GROWTH < 24 HOURS Performed at [facility], [address] [place], [place] [address]    Report Status PENDING   CBG monitoring, ED     Status: Abnormal   Collection Time: 09/08/24  6:44 PM  Result Value Ref Range   Glucose-Capillary 162 (H) 70 - 99 mg/dL    Comment: Glucose reference range applies only to samples taken after fasting for at least 8 hours.  CBG monitoring, ED     Status: Abnormal   Collection Time: 09/08/24 10:43 PM  Result Value Ref Range   Glucose-Capillary 114 (H) 70 - 99 mg/dL    Comment: Glucose reference range applies only to samples taken after fasting for at least 8 hours.  Comprehensive metabolic panel     Status: Abnormal   Collection Time: 09/09/24  3:48 AM  Result Value Ref Range   Sodium 143 135 - 145 mmol/L   Potassium 3.3 (L) 3.5 - 5.1 mmol/L   Chloride 116 (H) 98 - 111 mmol/L   CO2 14 (L) 22 - 32 mmol/L   Glucose, Bld 80 70 - 99 mg/dL    Comment: Glucose reference range applies only to samples taken after fasting for at least 8 hours.   BUN 24 (H) 8 - 23 mg/dL   Creatinine, Ser 8.79 0.61 - 1.24 mg/dL   Calcium 7.3 (L) 8.9 - 10.3 mg/dL   Total Protein 4.3 (L) 6.5 - 8.1 g/dL   Albumin 2.6 (L) 3.5 - 5.0 g/dL   AST 19 15 - 41 U/L  ALT 15 0 - 44 U/L   Alkaline Phosphatase 34 (L) 38 - 126 U/L   Total Bilirubin 1.9 (H) 0.0 -  1.2 mg/dL   GFR, Estimated >39 >39 mL/min    Comment: (NOTE) Calculated using the CKD-EPI Creatinine Equation (2021)    Anion gap 13 5 - 15    Comment: Performed at [facility], [address], [place], [place] [address]  CBC     Status: Abnormal   Collection Time: 09/09/24  3:48 AM  Result Value Ref Range   WBC 6.8 4.0 - 10.5 K/uL   RBC 4.08 (L) 4.22 - 5.81 MIL/uL   Hemoglobin 12.0 (L) 13.0 - 17.0 g/dL   HCT 60.2 60.9 - 47.9 %   MCV 97.3 80.0 - 100.0 fL    Comment: DELTA CHECK NOTED REPEATED TO VERIFY    MCH 29.4 26.0 - 34.0 pg   MCHC 30.2 30.0 - 36.0 g/dL   RDW 85.1 88.4 - 84.4 %   Platelets 117 (L) 150 - 400 K/uL   nRBC 0.0 0.0 - 0.2 %    Comment: Performed at [facility], [address] [place], [place] [address]  Hemoglobin A1c     Status: Abnormal   Collection Time: 09/09/24  3:48 AM  Result Value Ref Range   Hgb A1c MFr Bld 10.7 (H) 4.8 - 5.6 %    Comment: (NOTE) Diagnosis of Diabetes The following HbA1c ranges recommended by the American Diabetes Association (ADA) may be used as an aid in the diagnosis of diabetes mellitus.  Hemoglobin             Suggested A1C NGSP%              Diagnosis  <5.7                   Non Diabetic  5.7-6.4                Pre-Diabetic  >6.4                   Diabetic  <7.0                   Glycemic control for                       adults with diabetes.     Mean Plasma Glucose 260.39 mg/dL    Comment: Performed at [facility], [address], [place], [place] [address]  CBG monitoring, ED     Status: Abnormal   Collection Time: 09/09/24  8:13 AM  Result Value Ref Range   Glucose-Capillary 123 (H) 70 - 99 mg/dL    Comment: Glucose reference range applies only to samples taken after fasting for at least 8 hours.   MR BRAIN WO CONTRAST Result Date: 09/08/2024 EXAM: MRI Brain Without Contrast 09/08/2024 06:48:39 PM TECHNIQUE: Multiplanar multisequence MRI of the head/brain was performed without the  administration of intravenous contrast. COMPARISON: CT head earlier today. 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 Edema without mass effect. No intracranial hemorrhage. No mass. No midline shift. No hydrocephalus. The sella is unremarkable. Normal flow voids. ORBITS: No acute abnormality. SINUSES AND MASTOIDS: No acute abnormality. BONES AND SOFT TISSUES: Normal marrow signal. No acute soft tissue abnormality. IMPRESSION: 1. 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 2. Additional suspected punctate acute infarcts the  left frontal and right occipital lobes. Electronically signed by: Gilmore Molt MD 09/08/2024 07:57 PM EST RP Workstation: [id number]   CT Head Wo Contrast Result Date: 09/08/2024 EXAM: CT HEAD WITHOUT 09/08/2024 01:50:57 PM TECHNIQUE: CT of the head was performed without the administration of intravenous contrast. Automated exposure control, iterative reconstruction, and/or weight based adjustment of the mA/kV was utilized to reduce the radiation dose to as low as reasonably achievable. COMPARISON: None available. CLINICAL HISTORY: Transient ischemic attack (TIA) FINDINGS: BRAIN AND VENTRICLES: No acute intracranial hemorrhage. No mass effect or midline shift. No extra-axial fluid collection. No evidence of acute infarct. No hydrocephalus. Chronic ischemic white matter changes. Atherosclerotic calcifications in cavernous internal carotid arteries. ORBITS: Right lens replacement. SINUSES AND MASTOIDS: No acute abnormality. SOFT TISSUES AND SKULL: No acute skull fracture. No acute soft tissue abnormality. IMPRESSION: 1. No acute intracranial abnormality. 2. Chronic ischemic white matter changes. Electronically signed by: Franky Stanford MD 09/08/2024 02:59 PM EST RP Workstation: [id number]   DG Chest Portable 1 View Result Date: 09/08/2024 CLINICAL DATA:  Cough. EXAM: PORTABLE CHEST 1 VIEW  COMPARISON:  10/03/2023. FINDINGS: The heart size and mediastinal contours are unchanged. Known bilateral calcified pleural plaques. No appreciable acute airspace consolidation. No pleural effusion or pneumothorax. No acute osseous abnormality. IMPRESSION: 1. No acute cardiopulmonary findings. 2. Known calcified bilateral pleural plaques, compatible with asbestos related pleural disease. Electronically Signed   By: Harrietta Sherry M.D.   On: 09/08/2024 13:18    Anti-infectives (From admission, onward)    Start     Dose/Rate Route Frequency Ordered Stop   09/09/24 1200  cefTRIAXone  (ROCEPHIN ) 1 g in sodium chloride  0.9 % 100 mL IVPB        1 g 200 mL/hr over 30 Minutes Intravenous Every 24 hours 09/09/24 1133         Assessment/Plan Chest wall mass This is a 78 y.o. male with a hx of A. Fib on Eliquis  (LD 12/9 PM), myasthenia gravis on Vyvgart and prednisone  at home, HTN, CAD, CVA who we were asked to see for chest wall mass. Patient reports 2-3 week hx of R chest wall mass near his scapula for which he received a course of doxycycline by outside provider and had CT scan done at outside hospital showing increased soft tissue density in the right lateral chest wall overlying the scapula that was felt to likely represents subcutaneous posttraumatic change/hematoma with no evidence for an underlying fracture or extravasation of IV contrast to suggest the presence of active bleeding at the time of the study. He is currently stable with normal wbc and stable hgb. On exam, area most likely represents hematoma that appears to have tamponaded and should reabsorb with time. However, these can also liquify that can benefit with draining. There is no evidence of overlying cellulitis or infection at this time. This was discussed with patient's wife. It was explained that if drainage is needed, we could attempt at bedside vs under anesthesia. Wife explained that they would like to hold off on either of these at  this time. No emergent surgery needed at this time. However, we will continue to follow along.   FEN - Okay for diet per primary VTE - SCDs, Eliquis  ID - Rocephin  for UTI per primary   I reviewed nursing notes, hospitalist notes, last 24 h vitals and pain scores, last 48 h intake and output, last 24 h labs and trends, and last 24 h imaging results.   Marjorie Favre, [facility]  09/09/2024, 2:03 PM Please see Amion for pager number during day hours 7:00am-4:30pm

## 2024-09-09 NOTE — ED Notes (Signed)
 Attempted to give pt Tyrone Newton. Pt w/ increased difficulty swallowing thin liquids, coughing after taking a drink. Failed pt dysphagia, made pt NPO, provider notified via Epic message and family educated.

## 2024-09-09 NOTE — Evaluation (Signed)
 Physical Therapy Evaluation Patient Details Name: Tyrone Newton MRN: [id number] DOB: 1945-10-06 Today's Date: 09/09/2024  History of Present Illness  Pt is an 78 yo, presenting to the ED on 12/9 with weakness, requiring more assistance to ambulate.  Chest x-ray with no acute abnormality. 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   Per family, recent decline in overall function over the past 2-3 weeks.   PMH of CVA, myasthenia gravis, prostate cancer, hypertension, CAD, A-fib, gout.  Clinical Impression  Pt currently is very lethargic. Total A for all mobility at this time due to pt unable to follow commands. Pt was able to open eyes but distracted when eyes open due to family members in room and very concerned about pt current condition. Pt and family were unable to participate in goal setting due to concerns about pt current functional mobility; education on the role of physical therapy in the hospital, the importance of early mobility and the return to pt current functional level with little reception. Pt was able to initiate a roll but unable to complete with heavy multi modal cues, pt was able to lift UE above shoulder height and perform knee flexion/extension/ankle PF/DF with heavy multi modal cues. Pt demonstrates weakness but strength sufficient to work on functional activities and expected to progress well once lethargy improves; suspect due to medication secondary to earlier agitation. Pt has family support at home and 24/7 assist from spouse who is unable to provide physical assist. Due to pt current functional status, home set up and available assistance at home recommending skilled physical therapy services < 3 hours/day in order to address strength, balance and functional mobility to decrease risk for falls, injury, immobility, skin break down and re-hospitalization.           If plan is discharge home, recommend the following: A little help with walking and/or transfers;Assist for transportation;Help with stairs or ramp for entrance;Assistance with cooking/housework;Supervision due to cognitive status   Can travel by private vehicle   No    Equipment Recommendations Wheelchair cushion (measurements PT);Wheelchair (measurements PT);Hoyer lift;Hospital bed     Functional Status Assessment Patient has had a recent decline in their functional status and demonstrates the ability to make significant improvements in function in a reasonable and predictable amount of time.     Precautions / Restrictions Precautions Precautions: Other (comment);Fall Recall of Precautions/Restrictions: Impaired Precaution/Restrictions Comments: pt has a button that helps him urinate; has to be pushed to urinate. Restrictions Weight Bearing Restrictions Per Provider Order: No      Mobility  Bed Mobility Overal bed mobility: Needs Assistance Bed Mobility: Rolling Rolling: Total assist         General bed mobility comments: Attempting to perform supine to sitting. Pt assisting after multi modal cues to bring LE to EOB with Max tactile cues. Then started to bring legs back in bed. Pt very distracted and lethargic. Attempted rolling at Max A; pt unable to participate with active resistance. Unable to get fully into roll     Modified Rankin (Stroke Patients Only) Modified Rankin (Stroke Patients Only) Pre-Morbid Rankin Score: No symptoms Modified Rankin: Severe disability        Pertinent Vitals/Pain Pain Assessment Pain Assessment: No/denies pain    Home Living Family/patient expects to be discharged to:: Skilled nursing facility Living Arrangements: Spouse/significant other;Children  Additional Comments: Difficult to get home set up from family; Family was very distracted with patient care    Prior Function Prior Level of Function : Needs  assist             Mobility Comments: pt was using cane about 2 weeks prior to hospitalization per son ADLs Comments: spouse was assisting with urination device.     Extremity/Trunk Assessment   Upper Extremity Assessment Upper Extremity Assessment: Defer to OT evaluation;Generalized weakness (able to lift bil UE above shoulder height.)    Lower Extremity Assessment Lower Extremity Assessment: Generalized weakness;RLE deficits/detail;LLE deficits/detail;Difficult to assess due to impaired cognition RLE Deficits / Details: 3/5 knee flexion, ankle DF/PF LLE Deficits / Details: 3/5 knee flexion, ankle DF/PF       Communication   Communication Communication: Impaired Factors Affecting Communication: Hearing impaired    Cognition Arousal: Lethargic, Suspect due to medications Behavior During Therapy: Flat affect   PT - Cognitive impairments: Difficult to assess Difficult to assess due to: Level of arousal       PT - Cognition Comments: pt very lethargic during session Following commands: Impaired Following commands impaired: Follows one step commands inconsistently, Follows one step commands with increased time     Cueing Cueing Techniques: Verbal cues, Tactile cues, Visual cues     General Comments General comments (skin integrity, edema, etc.): Spouse, son and friend present initially. Pt very lethargic.        Assessment/Plan    PT Assessment Patient needs continued PT services  PT Problem List Decreased strength;Decreased activity tolerance;Decreased balance;Decreased mobility       PT Treatment Interventions DME instruction;Balance training;Gait training;Neuromuscular re-education;Patient/family education;Functional mobility training;Stair training;Therapeutic activities;Wheelchair mobility training;Therapeutic exercise    PT Goals (Current goals can be found in the Care Plan section)  Acute Rehab PT Goals PT Goal Formulation: Patient unable to  participate in goal setting Time For Goal Achievement: 09/23/24 Potential to Achieve Goals: Fair    Frequency Min 2X/week     Co-evaluation               AM-PAC PT 6 Clicks Mobility  Outcome Measure Help needed turning from your back to your side while in a flat bed without using bedrails?: Total Help needed moving from lying on your back to sitting on the side of a flat bed without using bedrails?: Total Help needed moving to and from a bed to a chair (including a wheelchair)?: Total Help needed standing up from a chair using your arms (e.g., wheelchair or bedside chair)?: Total Help needed to walk in hospital room?: Total Help needed climbing 3-5 steps with a railing? : Total 6 Click Score: 6    End of Session   Activity Tolerance: Patient limited by lethargy Patient left: in bed;with call bell/phone within reach;with bed alarm set;with family/visitor present Nurse Communication: Mobility status;Need for lift equipment PT Visit Diagnosis: Other abnormalities of gait and mobility (R26.89);Muscle weakness (generalized) (M62.81)    Time: 8460-8387 PT Time Calculation (min) (ACUTE ONLY): 33 min   Charges:   PT Evaluation $PT Eval Moderate Complexity: 1 Mod   PT General Charges $$ ACUTE PT VISIT: 1 Visit         Dorothyann Maier, DPT, CLT  Acute Rehabilitation Services Office: [phone] (Secure chat preferred)   Dorothyann VEAR Maier 09/09/2024, 4:29 PM

## 2024-09-09 NOTE — ED Notes (Signed)
 Dr. Alfornia paged in regards to agitation. After speaking to Dr. Alfornia on phone she requested that RN reach out to neurology in regards to a safe medication for patient to receive. Neurology reached out via secure chat at this time.

## 2024-09-09 NOTE — ED Notes (Signed)
 Spoke to Dr. Lindzen on the phone at this time. Per Neurology a verbal order is to be placed for 100mg  trazadone PO x1 for sleep.

## 2024-09-09 NOTE — Progress Notes (Signed)
°  Echocardiogram 2D Echocardiogram has been performed.  Tyrone Newton 09/09/2024, 2:17 PM

## 2024-09-09 NOTE — Progress Notes (Signed)
° °  Brief Progress Note   _____________________________________________________________________________________________________________  Patient Name: Tyrone Newton Patient DOB: [birth year] Date: @TODAY @      Data: Reviewed vital signs, labs, and notes.    Action: No action required at this time. Currently no progressive beds available at this time.    Response:    _____________________________________________________________________________________________________________  [facility] Khiara Shuping Please contact us  directly via secure chat (search for [facility]) or by calling us  at [phone] [facility]).

## 2024-09-09 NOTE — ED Notes (Signed)
 Spoke with Dr. Alfornia regarding runs of [facility].

## 2024-09-09 NOTE — Plan of Care (Signed)

## 2024-09-09 NOTE — Progress Notes (Signed)
 NIF -20     Best %Pred FVC  1.40 29 FEV1  1.36 39 FEV1/FVC 0.97 ---- [id number] [L/s] 0.50 21 PEF [L/s] 3.66 44 BEV [L] ---- ---- FET [s]  9.23 ----  Performed with good effort.

## 2024-09-09 NOTE — Progress Notes (Signed)
 STROKE TEAM PROGRESS NOTE   SUBJECTIVE (INTERVAL HISTORY) His wife and son are at the bedside.  Overall his condition is stable.  Patient lying in bed, drowsy sleepy, however open eyes on voice, able to answer some orientation questions.  Complaining of headache and generalized body pain.  Per wife and son, patient has diagnosed of  myasthenia gravis 2 years ago, was on Vyvgart injections but not very effective.  Currently on prednisone  and Mestinon .  [facility].  For last 2 to 3 months, patient had some general decline of muscle strength, loss of weight but still able to walk with walker or cane.  However for the last 2 to 3 weeks, he had dramatic decline, not getting out of bed, whole body pain, lethargic, not walking well  He also had a frequent UTI, apparently his UA this admission showed WBC 21-50.  He also had right lateral chest abscess treated medically for the last several weeks.   OBJECTIVE Temp:  [97.4 F (36.3 C)-98.7 F (37.1 C)] 98.7 F (37.1 C) (12/10 1600) Pulse Rate:  [47-93] 80 (12/10 1045) Cardiac Rhythm: Atrial fibrillation (12/10 1401) Resp:  [13-27] 25 (12/10 1045) BP: (104-138)/(62-89) 126/73 (12/10 1045) SpO2:  [93 %-100 %] 98 % (12/10 1045)  Recent Labs  Lab 09/08/24 1844 09/08/24 2243 09/09/24 0813 09/09/24 1449 09/09/24 1610  GLUCAP 162* 114* 123* 99 105*   Recent Labs  Lab 09/08/24 1117 09/08/24 1418 09/09/24 0348  NA 140 140 143  K 3.7 3.7 3.3*  CL 104  --  116*  CO2 25  --  14*  GLUCOSE 149*  --  80  BUN 24*  --  24*  CREATININE 1.24  --  1.20  CALCIUM 9.2  --  7.3*   Recent Labs  Lab 09/08/24 1117 09/09/24 0348  AST 16 19  ALT 21 15  ALKPHOS 39 34*  BILITOT 2.5* 1.9*  PROT 5.4* 4.3*  ALBUMIN 3.5 2.6*   Recent Labs  Lab 09/08/24 1117 09/08/24 1418 09/09/24 0348  WBC 7.2  --  6.8  NEUTROABS 6.2  --   --   HGB 12.2* 11.6* 12.0*  HCT 38.2* 34.0* 39.7  MCV 90.7  --  97.3  PLT 133*  --  117*   No results for  input(s): CKTOTAL, CKMB, CKMBINDEX, TROPONINI in the last 168 hours. No results for input(s): LABPROT, INR in the last 72 hours. Recent Labs    09/08/24 1500  COLORURINE AMBER*  LABSPEC 1.023  PHURINE 5.0  GLUCOSEU >=500*  HGBUR LARGE*  BILIRUBINUR NEGATIVE  KETONESUR 20*  PROTEINUR NEGATIVE  NITRITE POSITIVE*  LEUKOCYTESUR SMALL*       Component Value Date/Time   CHOL 82 09/09/2024 1557   TRIG 83 09/09/2024 1557   HDL 34 (L) 09/09/2024 1557   CHOLHDL 2.4 09/09/2024 1557   VLDL 17 09/09/2024 1557   LDLCALC 31 09/09/2024 1557   Lab Results  Component Value Date   HGBA1C 10.7 (H) 09/09/2024   No results found for: LABOPIA, COCAINSCRNUR, LABBENZ, AMPHETMU, THCU, LABBARB  Recent Labs  Lab 09/08/24 1117  ETH <15    I have personally reviewed the radiological images below and agree with the radiology interpretations.  CT HEAD WO CONTRAST ( ) Result Date: 09/09/2024 EXAM: CT HEAD WITHOUT 09/09/2024 03:21:54 PM TECHNIQUE: CT of the head was performed without the administration of intravenous contrast. Automated exposure control, iterative reconstruction, and/or weight based adjustment of the mA/kV was utilized to reduce the radiation dose to  as low as reasonably achievable. COMPARISON: MRI brain 09/08/2024. CT head dated 09/08/2024. CLINICAL HISTORY: Stroke/TIA, determine embolic source. FINDINGS: BRAIN AND VENTRICLES: No acute intracranial hemorrhage. No mass effect or midline shift. No extra-axial fluid collection. There is overall similar mild scattered white matter hypodensities, which are nonspecific but most commonly represent chronic microvascular ischemic changes. 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 No evidence of a new acute territorial infarction. No hydrocephalus. ORBITS: Post right native opacular lens replacement.  SINUSES AND MASTOIDS: No acute abnormality. SOFT TISSUES AND SKULL: No acute skull fracture. No acute soft tissue abnormality. IMPRESSION: 1. No acute intracranial hemorrhage or evidence of a new acute territorial infarction. 2. Previously identified recent bilateral cerebellar infarctions are seen more clearly on recent MRI brain 09/08/2024, see separate report for further relevant evaluation. Electronically signed by: prentice bybordi 09/09/2024 04:05 PM EST RP Workstation: [id number]   ECHOCARDIOGRAM COMPLETE Result Date: 09/09/2024    ECHOCARDIOGRAM REPORT   Patient Name:   Tyrone Newton Date of Exam: 09/09/2024 Medical Rec #:  [id number]        Height:       75.0 in Accession #:    [id number]       Weight:       175.0 lb Date of Birth:  [birth year]        BSA:          2.073 m Patient Age:    78 years         BP:           128/78 mmHg Patient Gender: M                HR:           72 bpm. Exam Location:  Inpatient Procedure: 2D Echo (Both Spectral and Color Flow Doppler were utilized during            procedure). Indications:    Stroke  History:        Patient has no prior history of Echocardiogram examinations.                 Arrythmias:Atrial Fibrillation.  Sonographer:    Charmaine Gaskins Referring Phys: [id number] Hunter Pinkard IMPRESSIONS  1. Left ventricular ejection fraction, by estimation, is 60 to 65%. The left ventricle has normal function. The left ventricle has no regional wall motion abnormalities. Left ventricular diastolic parameters are indeterminate.  2. Right ventricular systolic function is normal. The right ventricular size is normal. There is normal pulmonary artery systolic pressure.  3. The mitral valve is normal in structure. Moderate mitral valve regurgitation. No evidence of mitral stenosis.  4. Tricuspid valve regurgitation is mild to moderate.  5. The aortic valve is tricuspid. There is mild calcification of the aortic valve. Aortic valve regurgitation is mild to moderate. Aortic valve  sclerosis is present, with no evidence of aortic valve stenosis.  6. Aortic dilatation noted. Aneurysm of the ascending aorta, measuring 45 mm.  7. The inferior vena cava is normal in size with greater than 50% respiratory variability, suggesting right atrial pressure of 3 mmHg. Conclusion(s)/Recommendation(s): No intracardiac source of embolism detected on this transthoracic study. Consider a transesophageal echocardiogram to exclude cardiac source of embolism if clinically indicated. FINDINGS  Left Ventricle: Left ventricular ejection fraction, by estimation, is 60 to 65%. The left ventricle has normal function. The left ventricle has no regional wall motion  abnormalities. The left ventricular internal cavity size was normal in size. There is  no left ventricular hypertrophy. Left ventricular diastolic parameters are indeterminate. Right Ventricle: The right ventricular size is normal. No increase in right ventricular wall thickness. Right ventricular systolic function is normal. There is normal pulmonary artery systolic pressure. The tricuspid regurgitant velocity is 2.56 m/s, and  with an assumed right atrial pressure of 3 mmHg, the estimated right ventricular systolic pressure is 29.2 mmHg. Left Atrium: Left atrial size was normal in size. Right Atrium: Right atrial size was normal in size. Pericardium: There is no evidence of pericardial effusion. Mitral Valve: The mitral valve is normal in structure. Moderate mitral valve regurgitation. No evidence of mitral valve stenosis. Tricuspid Valve: The tricuspid valve is normal in structure. Tricuspid valve regurgitation is mild to moderate. No evidence of tricuspid stenosis. Aortic Valve: The aortic valve is tricuspid. There is mild calcification of the aortic valve. Aortic valve regurgitation is mild to moderate. Aortic valve sclerosis is present, with no evidence of aortic valve stenosis. Pulmonic Valve: The pulmonic valve was normal in structure. Pulmonic valve  regurgitation is mild. No evidence of pulmonic stenosis. Aorta: Aortic dilatation noted. There is an aneurysm involving the ascending aorta measuring 45 mm. Venous: The inferior vena cava is normal in size with greater than 50% respiratory variability, suggesting right atrial pressure of 3 mmHg. IAS/Shunts: No atrial level shunt detected by color flow Doppler.  LEFT VENTRICLE PLAX 2D LVIDd:         4.20 cm     Diastology LVIDs:         3.00 cm     LV e' medial:    12.70 cm/s LV PW:         1.00 cm     LV E/e' medial:  7.8 LV IVS:        1.00 cm     LV e' lateral:   15.40 cm/s LVOT diam:     2.20 cm     LV E/e' lateral: 6.5 LVOT Area:     3.80 cm  LV Volumes (MOD) LV vol d, MOD A2C: 70.7 ml LV vol d, MOD A4C: 86.5 ml LV vol s, MOD A2C: 27.7 ml LV vol s, MOD A4C: 29.1 ml LV SV MOD A2C:     43.0 ml LV SV MOD A4C:     86.5 ml LV SV MOD BP:      50.5 ml RIGHT VENTRICLE RV Basal diam:  3.70 cm RV Mid diam:    4.20 cm RV S prime:     17.70 cm/s LEFT ATRIUM              Index        RIGHT ATRIUM           Index LA diam:        3.40 cm  1.64 cm/m   RA Area:     26.80 cm LA Vol (A2C):   111.0 ml 53.54 ml/m  RA Volume:   79.00 ml  38.10 ml/m LA Vol (A4C):   104.0 ml 50.16 ml/m LA Biplane Vol: 108.0 ml 52.09 ml/m   AORTA Ao Root diam: 3.70 cm Ao Asc diam:  4.62 cm MITRAL VALVE               TRICUSPID VALVE MV Area (PHT): 4.38 cm    TR Peak grad:   26.2 mmHg MV Decel Time: 173 msec    TR Vmax:  256.00 cm/s MV E velocity: 99.50 cm/s                            SHUNTS                            Systemic Diam: 2.20 cm Oneil Parchment MD Electronically signed by Oneil Parchment MD Signature Date/Time: 09/09/2024/3:16:51 PM    Final    MR BRAIN WO CONTRAST Result Date: 09/08/2024 EXAM: MRI Brain Without Contrast 09/08/2024 06:48:39 PM TECHNIQUE: Multiplanar multisequence MRI of the head/brain was performed without the administration of intravenous contrast. COMPARISON: CT head earlier today. CLINICAL HISTORY: Transient  ischemic attack (TIA) FINDINGS: BRAIN AND VENTRICLES: Numerous acute infarcts in the cerebellum bilaterally. Edema without mass effect. No intracranial hemorrhage. No mass. No midline shift. No hydrocephalus. The sella is unremarkable. Normal flow voids. ORBITS: No acute abnormality. SINUSES AND MASTOIDS: No acute abnormality. BONES AND SOFT TISSUES: Normal marrow signal. No acute soft tissue abnormality. IMPRESSION: 1. 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 2. Additional suspected punctate acute infarcts the left frontal and right occipital lobes. Electronically signed by: Gilmore Molt MD 09/08/2024 07:57 PM EST RP Workstation: [id number]   CT Head Wo Contrast Result Date: 09/08/2024 EXAM: CT HEAD WITHOUT 09/08/2024 01:50:57 PM TECHNIQUE: CT of the head was performed without the administration of intravenous contrast. Automated exposure control, iterative reconstruction, and/or weight based adjustment of the mA/kV was utilized to reduce the radiation dose to as low as reasonably achievable. COMPARISON: None available. CLINICAL HISTORY: Transient ischemic attack (TIA) FINDINGS: BRAIN AND VENTRICLES: No acute intracranial hemorrhage. No mass effect or midline shift. No extra-axial fluid collection. No evidence of acute infarct. No hydrocephalus. Chronic ischemic white matter changes. Atherosclerotic calcifications in cavernous internal carotid arteries. ORBITS: Right lens replacement. SINUSES AND MASTOIDS: No acute abnormality. SOFT TISSUES AND SKULL: No acute skull fracture. No acute soft tissue abnormality. IMPRESSION: 1. No acute intracranial abnormality. 2. Chronic ischemic white matter changes. Electronically signed by: Franky Stanford MD 09/08/2024 02:59 PM EST RP Workstation: [id number]   DG Chest Portable 1 View Result Date: 09/08/2024 CLINICAL DATA:  Cough. EXAM: PORTABLE CHEST 1 VIEW COMPARISON:  10/03/2023. FINDINGS: The heart size and mediastinal contours are unchanged. Known  bilateral calcified pleural plaques. No appreciable acute airspace consolidation. No pleural effusion or pneumothorax. No acute osseous abnormality. IMPRESSION: 1. No acute cardiopulmonary findings. 2. Known calcified bilateral pleural plaques, compatible with asbestos related pleural disease. Electronically Signed   By: Harrietta Sherry M.D.   On: 09/08/2024 13:18     PHYSICAL EXAM  Temp:  [97.4 F (36.3 C)-98.7 F (37.1 C)] 98.7 F (37.1 C) (12/10 1600) Pulse Rate:  [47-93] 80 (12/10 1045) Resp:  [13-27] 25 (12/10 1045) BP: (104-138)/(62-89) 126/73 (12/10 1045) SpO2:  [93 %-100 %] 98 % (12/10 1045)  General - Well nourished, well developed, in mild distress due to generalized body pain and headache.  Ophthalmologic - fundi not visualized due to noncooperation.  Cardiovascular - irregularly irregular heart rate and rhythm.  Neuro - drowsy sleepy, eyes closed, open to voice and tactile stimulation. With eyes open, orientated to place and people, not orientated to age, month and year. No aphasia but limited language output, following most simple commands with delay.  Not cooperative with naming and repetition. No gaze palsy, tracking bilaterally, visual field full. No significant facial droop. Tongue midline. Bilateral UEs 4/5, no drift. Bilaterally LEs  3-/5. Sensation and coordination not corporative, gait not tested.    ASSESSMENT/PLAN Tyrone Newton is a 78 y.o. male with history of myasthenia gravis on prednisone  and Mestinon , hypertension, A-fib on Eliquis , CAD, AV block type I, gout, prostate cancer status post surgery admitted for abrupt functional decline with whole body weakness and soft voice with falling at home.     Stroke:  bilateral scattered cerebellar infarcts along with 2 punctate infarcts at left medial frontal lobe and right temporal occipital periventricular white matter, embolic pattern secondary to afib not compliant with Eliquis  versus hypercoagulable state from  malignancy CT no acute abnormality MRI  Numerous acute infarcts in the cerebellum bilaterally. Additional suspected punctate acute infarcts the left frontal and right occipital lobes. CTA head and neck pending May consider pan CT to rule out advanced malignancy if workup unrevealing 2D Echo EF 60 to 65% LDL 31 HgbA1c 10.7 Eliquis  for VTE prophylaxis Eliquis  (apixaban ) daily prior to admission, now on Eliquis  (apixaban ) daily.  Ongoing aggressive stroke risk factor management Therapy recommendations: SNF Disposition: Pending  Myasthenia gravis Diagnosed 2 years ago in [place] Was on Vyvgart in the past but not effective per family Now on prednisone  and Mestinon  DC Lipitor due to potential worsening MG  General Functional decline Complicated by weight loss, UTI, uncontrolled diabetes and Lipitor use DC Lipitor On Rocephin  Given history of prostate cancer, recommend to rule out metastasis if workup unrevealing PT and OT  AF Rate controlled On Eliquis  at home but with doubtful compliance given patient taking care of medication by himself, however family insists compliance Continue Eliquis   Diabetes HgbA1c 10.7 goal < 7.0 Uncontrolled CBG monitoring SSI DM education and close PCP follow up  Hypertension Stable Avoid low BP Long term BP goal normotensive  Hyperlipidemia Home meds: Lipitor 40 LDL 31, goal < 70 Now on no statin given MG with worsening weakness  UTI 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 Fairy. I spent extensive total face-to-face time with the patient and wife and son, reviewing test results, images and medication, and discussing the  diagnosis, treatment plan and potential prognosis. This patient's care requiresreview of multiple databases, neurological assessment, discussion with family, other specialists and medical decision making of high complexity.  Ary Cummins, MD PhD Stroke Neurology 09/09/2024 5:58 PM    To contact Stroke Continuity provider, please refer to [email or web address]. After hours, contact General Neurology

## 2024-09-09 NOTE — ED Notes (Addendum)
 Pt w/ 7 then 4 then 2 runs of Vtach. CCMD called primary RN. Primary RN noted on rhythm strip. [place], MD paged for notification.

## 2024-09-09 NOTE — Progress Notes (Signed)
 Pt increasingly agitated. Pulled IV out, condom cath off, removed all leads and BP cuff. Attempting to climb out of bed, cursing and swinging at staff. Bilateral mitts applied and pt medicated per order. Wife at bedside. IV consult placed.

## 2024-09-09 NOTE — ED Notes (Signed)
 Patient pulling off wires and trying to get out of bed at this time.

## 2024-09-09 NOTE — Progress Notes (Signed)
 Pt unable to do NIF/VC is sleeping and restless family stated to let him rest will try tomorrow morning

## 2024-09-09 NOTE — ED Notes (Signed)
 Pt moved to [facility] bed

## 2024-09-09 NOTE — Progress Notes (Signed)
 [facility] night cross cover note:   I was notified by the patient's RN that, per today's SLE, patient may have exceptions superimposed on existing order for NPO.  Based upon this, a subsequent updated n.p.o. order to reflect sips with meds and ice chips.     Eva Pore, DO Hospitalist

## 2024-09-09 NOTE — Evaluation (Signed)
 Clinical/Bedside Swallow Evaluation Patient Details  Name: Tyrone Newton MRN: [id number] Date of Birth: Sep 28, 1946  Today's Date: 09/09/2024 Time: SLP Start Time (ACUTE ONLY): 1355 SLP Stop Time (ACUTE ONLY): 1420 SLP Time Calculation (min) (ACUTE ONLY): 25 min  Past Medical History:  Past Medical History:  Diagnosis Date   Cancer (HCC)    Diabetes (HCC) 09/08/2024   GERD (gastroesophageal reflux disease)    Gout    HTN (hypertension) 07/31/2024   Myasthenia gravis (HCC)    Past Surgical History:  Past Surgical History:  Procedure Laterality Date   CHOLECYSTECTOMY     PROSTATE SURGERY     due to cancer   HPI:  Tyrone Newton is an 78/M, presented to the ED with weakness, requiring more assistance to ambulate.  Chest x-ray with no acute abnormality. 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   Per family, recent decline in overall function over the past 2-3 weeks.   PMH of CVA, myasthenia gravis, prostate cancer, hypertension, CAD, A-fib, gout.    Assessment / Plan / Recommendation  Clinical Impression  Bedside swallow evaluation complete. Patient immediately verbally and physically agitated upon wakening. SLP able to redirect for swallow evaluation however unable to fully assess oral function vis formal oral motor exam. Observable left sided facial assymetry noted and given degree of dysarthria, significant,  suspect facial and lingual deficits present. Consumption of both thin and nectar thick liquids resulted in s/s of aspiration characterized by coughing and/or throat clearing post swallow as well as multiple rapid swallows suspicious for delayed swallow initiation. Trials of pureed solids resulted in a more controlled appearing oropharyngeal swallow without overt indication of aspiration. Discussed with spouse. Recommend NPO except meds crushed in puree and ice chips  after oral care. MBS in am to evaluate swallowing physiology and determine least restrictive diet. SLP Visit Diagnosis: Dysphagia, oropharyngeal phase (R13.12)          Other Recommendations Oral Care Recommendations: Oral care QID;Oral care prior to ice chip/H20     Swallow Evaluation Recommendations Recommendations: NPO except meds;Ice chips PRN after oral care Medication Administration: Crushed with puree Oral care recommendations: Oral care QID (4x/day);Oral care before ice chips/water   Assistance Recommended at Discharge    Functional Status Assessment Patient has had a recent decline in their functional status and demonstrates the ability to make significant improvements in function in a reasonable and predictable amount of time.         Prognosis Prognosis for improved oropharyngeal function: Good Barriers to Reach Goals: Other (Comment) (potential baseline dysphagia)      Swallow Study   General HPI: Tyrone Newton is an 78/M, presented to the ED with weakness, requiring more assistance to ambulate.  Chest x-ray with no acute abnormality. 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   Per family, recent decline in overall function over the past 2-3 weeks.   PMH of CVA, myasthenia gravis, prostate cancer, hypertension, CAD, A-fib, gout. Type of Study: Bedside Swallow Evaluation Previous Swallow Assessment: none Diet Prior to this Study: NPO Temperature Spikes Noted: No Respiratory Status: Room air History of Recent Intubation: No Behavior/Cognition: Alert;Confused;Agitated;Requires cueing;Impulsive Oral Cavity Assessment: Within Functional Limits Oral Care Completed by SLP: No (due to agitation) Vision: Functional for self-feeding Self-Feeding Abilities: Able to feed self Patient Positioning: Upright in  bed Baseline Vocal Quality: Normal Volitional Cough:  Cognitively unable to elicit Volitional Swallow: Unable to elicit    Oral/Motor/Sensory Function Overall Oral Motor/Sensory Function: Moderate impairment (unable to fully assess due to agitation, given degree of dysarthria suspect facial and lingual deficits.) Facial ROM: Reduced left;Suspected CN VII (facial) dysfunction Facial Symmetry: Abnormal symmetry left;Suspected CN VII (facial) dysfunction Facial Strength: Reduced left;Suspected CN VII (facial) dysfunction   Ice Chips Ice chips: Within functional limits   Thin Liquid Thin Liquid: Impaired Presentation: Cup;Self Fed Oral Phase Impairments: Reduced labial seal Oral Phase Functional Implications: Left anterior spillage Pharyngeal  Phase Impairments: Multiple swallows;Suspected delayed Swallow;Throat Clearing - Delayed;Cough - Immediate    Nectar Thick Nectar Thick Liquid: Impaired Presentation: Cup;Self Fed Pharyngeal Phase Impairments: Multiple swallows;Throat Clearing - Immediate;Throat Clearing - Delayed   Honey Thick Honey Thick Liquid: Not tested   Puree Puree: Impaired Presentation: Self Fed;Spoon Oral Phase Impairments: Reduced labial seal Oral Phase Functional Implications: Left anterior spillage   Solid     Solid: Not tested     [name], CCC-SLP  [name] 09/09/2024,2:35 PM

## 2024-09-09 NOTE — Progress Notes (Signed)
 [facility] night cross cover note:   I was notified by the patient's RN that the patient is exhibiting recurrent agitation, and is not yet eligible for his next dose of prn every 6 hours or Haldol .  RN conveys that there is an existing one-time order available for trazodone .  I conveyed to the patient's RN that it is okay to proceed with existing one-time order for trazodone .  Additionally, I added an order for a one-time prn dose of IM Geodon  for agitation refractory to Haldol .     Eva Pore, DO Hospitalist

## 2024-09-09 NOTE — ED Notes (Signed)
 US  attempted to see pt again. Unable to complete scan d/t pt being uncooperative

## 2024-09-09 NOTE — Progress Notes (Addendum)
 PROGRESS NOTE    Tyrone Newton  FMW:[id number] DOB: [birth year] DOA: 09/08/2024 PCP: Milissa Savant, MD  78/M, chronically ill with CVA, myasthenia gravis, prostate cancer, hypertension, CAD, A-fib, gout presented to the ED with weakness, requiring more assistance to ambulate, recent slurred speech, felt to be slumped over yesterday.,  Son and wife at bedside report ongoing decline for 2 to 3 weeks, decreased functional status - Also has generalized pain, poor appetite, limited p.o. intake. - Recently evaluated in [facility] ED for right sided chest wall mass,, suspected to be a cyst, imaging at [facility] concerning for posttraumatic change versus hematoma - In the ED vital stable, labs with hemoglobin stable at 12.2, platelets 133, lactate normal, glucose 149, VBG with normal pH and pCO2 34, chest x-ray with no acute abnormality, calcified plaque, CT head no acute abnormality, MRI brain pending - Seen by neurology in the ED were concerned about myasthenia gravis exacerbation   Subjective: Patient seen in the ER, confused, mental status slowly improving  Assessment and Plan:  Acute CVA Numerous bilateral infarcts, embolic infarcts - Known history of A-fib, despite ongoing decline for few weeks according to wife he was managing his medications and taking them, suspect he may have missed Eliquis  doses -Complete stroke workup, plan for echo, CTA -Failed bedside swallow, SLP to see -Check lipids, HbA1c - PT OT eval - Anticipate need for short-term rehab wherever he goes staff will need to be educated on his urethral sphincter device  Abnormal UA, concern for UTI , Start ceftriaxone , follow-up urine culture - Monitor for urinary retention - has a device with a button in his scrotum to replace function of his urethral sphincter, wife has to push this button so he can urinate, advised her to stay with the patient is much as possible since I doubt staff is used to manipulating this device  History  of myasthenia gravis Continue pyridostigmine  and prednisone   Anterior neck swelling - Sh is normal, follow-up CTA   Right chest wall mass > Recent CT showed likely posttraumatic change versus hematoma.  Had some surrounding erythema  - Clinically suspect abscess, will request surgical eval   Diabetes - SSI, follow-up HbA1c   History of CVA - Continue ASA, Eliquis  - Hold atorvastatin for now   Paroxysmal atrial fibrillation > New onset in July of this year per notes. - Continue with Eliquis    CAD - Continue Eliquis  - Holding Atorvastatin   Prostate cancer > Status post prostatectomy - Device in place for urethral sphincter   DVT prophylaxis: Eliquis  Code Status: Full code Family Communication: Wife and-Son at bedside Disposition Plan: Rehab  Consultants:    Procedures:   Antimicrobials:    Objective: Vitals:   09/09/24 1000 09/09/24 1015 09/09/24 1030 09/09/24 1045  BP: 126/75 122/65 124/69 126/73  Pulse: 81 77 80 80  Resp: (!) 23 18 (!) 22 (!) 25  Temp:      TempSrc:      SpO2: 97% 94% 96% 98%  Weight:      Height:        Intake/Output Summary (Last 24 hours) at 09/09/2024 1125 Last data filed at 09/09/2024 0200 Gross per 24 hour  Intake --  Output 600 ml  Net -600 ml   Filed Weights   09/08/24 1029  Weight: 79.4 kg    Examination:  General exam: Elderly [name], awake alert, oriented to self, partly to place, cognitive deficits HEENT: Neck swelling, no JVD Respiratory system: Clear to auscultation Right lateral  chest wall with tennis ball size mass with surrounding induration and tenderness Cardiovascular system: S1 & S2 heard, RRR.  Abd: nondistended, soft and nontender.Normal bowel sounds heard. Central nervous system: Alert and oriented. No focal neurological deficits. Extremities: no edema Skin: As above Psychiatry: Flat affect    Data Reviewed:   CBC: Recent Labs  Lab 09/08/24 1117 09/08/24 1418 09/09/24 0348  WBC  7.2  --  6.8  NEUTROABS 6.2  --   --   HGB 12.2* 11.6* 12.0*  HCT 38.2* 34.0* 39.7  MCV 90.7  --  97.3  PLT 133*  --  117*   Basic Metabolic Panel: Recent Labs  Lab 09/08/24 1117 09/08/24 1418 09/09/24 0348  NA 140 140 143  K 3.7 3.7 3.3*  CL 104  --  116*  CO2 25  --  14*  GLUCOSE 149*  --  80  BUN 24*  --  24*  CREATININE 1.24  --  1.20  CALCIUM 9.2  --  7.3*   GFR: Estimated Creatinine Clearance: 55.1 mL/min (by C-G formula based on SCr of 1.2 mg/dL). Liver Function Tests: Recent Labs  Lab 09/08/24 1117 09/09/24 0348  AST 16 19  ALT 21 15  ALKPHOS 39 34*  BILITOT 2.5* 1.9*  PROT 5.4* 4.3*  ALBUMIN 3.5 2.6*   No results for input(s): LIPASE, AMYLASE in the last 168 hours. Recent Labs  Lab 09/08/24 1411  AMMONIA 25   Coagulation Profile: No results for input(s): INR, PROTIME in the last 168 hours. Cardiac Enzymes: No results for input(s): CKTOTAL, CKMB, CKMBINDEX, TROPONINI in the last 168 hours. BNP (last 3 results) No results for input(s): PROBNP in the last 8760 hours. HbA1C: Recent Labs    09/09/24 0348  HGBA1C 10.7*   CBG: Recent Labs  Lab 09/08/24 1844 09/08/24 2243 09/09/24 0813  GLUCAP 162* 114* 123*   Lipid Profile: No results for input(s): CHOL, HDL, LDLCALC, TRIG, CHOLHDL, LDLDIRECT in the last 72 hours. Thyroid Function Tests: Recent Labs    09/08/24 1411  TSH 0.500   Anemia Panel: No results for input(s): VITAMINB12, FOLATE, FERRITIN, TIBC, IRON, RETICCTPCT in the last 72 hours. Urine analysis:    Component Value Date/Time   COLORURINE AMBER (A) 09/08/2024 1500   APPEARANCEUR CLOUDY (A) 09/08/2024 1500   LABSPEC 1.023 09/08/2024 1500   PHURINE 5.0 09/08/2024 1500   GLUCOSEU >=500 (A) 09/08/2024 1500   HGBUR LARGE (A) 09/08/2024 1500   BILIRUBINUR NEGATIVE 09/08/2024 1500   KETONESUR 20 (A) 09/08/2024 1500   PROTEINUR NEGATIVE 09/08/2024 1500   UROBILINOGEN 1.0 07/28/2013 2109    NITRITE POSITIVE (A) 09/08/2024 1500   LEUKOCYTESUR SMALL (A) 09/08/2024 1500   Sepsis Labs: @LABRCNTIP (procalcitonin:4,lacticidven:4)  ) Recent Results (from the past 240 hours)  Blood culture (routine x 2)     Status: None (Preliminary result)   Collection Time: 09/08/24  3:03 PM   Specimen: BLOOD  Result Value Ref Range Status   Specimen Description BLOOD SITE NOT SPECIFIED  Final   Special Requests   Final    BOTTLES DRAWN AEROBIC AND ANAEROBIC Blood Culture adequate volume   Culture   Final    NO GROWTH < 24 HOURS Performed at [facility], [address], [place], [place] [address]    Report Status PENDING  Incomplete  Blood culture (routine x 2)     Status: None (Preliminary result)   Collection Time: 09/08/24  3:21 PM   Specimen: BLOOD  Result Value Ref Range  Status   Specimen Description BLOOD SITE NOT SPECIFIED  Final   Special Requests   Final    BOTTLES DRAWN AEROBIC ONLY Blood Culture results may not be optimal due to an inadequate volume of blood received in culture bottles   Culture   Final    NO GROWTH < 24 HOURS Performed at [facility], [address] [place], [place] [address]    Report Status PENDING  Incomplete     Radiology Studies: MR BRAIN WO CONTRAST Result Date: 09/08/2024 EXAM: MRI Brain Without Contrast 09/08/2024 06:48:39 PM TECHNIQUE: Multiplanar multisequence MRI of the head/brain was performed without the administration of intravenous contrast. COMPARISON: CT head earlier today. 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 Edema without mass effect. No intracranial hemorrhage. No mass. No midline shift. No hydrocephalus. The sella is unremarkable. Normal flow voids. ORBITS: No acute abnormality. SINUSES AND MASTOIDS: No acute abnormality. BONES AND SOFT TISSUES: Normal marrow signal. No acute soft tissue abnormality. IMPRESSION: 1. Numerous  acute infarcts in the cerebellum bilaterally. 2. Additional suspected punctate acute infarcts the left frontal and right occipital lobes. Electronically signed by: Gilmore Molt MD 09/08/2024 07:57 PM EST RP Workstation: [id number]   CT Head Wo Contrast Result Date: 09/08/2024 EXAM: CT HEAD WITHOUT 09/08/2024 01:50:57 PM TECHNIQUE: CT of the head was performed without the administration of intravenous contrast. Automated exposure control, iterative reconstruction, and/or weight based adjustment of the mA/kV was utilized to reduce the radiation dose to as low as reasonably achievable. COMPARISON: None available. CLINICAL HISTORY: Transient ischemic attack (TIA) FINDINGS: BRAIN AND VENTRICLES: No acute intracranial hemorrhage. No mass effect or midline shift. No extra-axial fluid collection. No evidence of acute infarct. No hydrocephalus. Chronic ischemic white matter changes. Atherosclerotic calcifications in cavernous internal carotid arteries. ORBITS: Right lens replacement. SINUSES AND MASTOIDS: No acute abnormality. SOFT TISSUES AND SKULL: No acute skull fracture. No acute soft tissue abnormality. IMPRESSION: 1. No acute intracranial abnormality. 2. Chronic ischemic white matter changes. Electronically signed by: Franky Stanford MD 09/08/2024 02:59 PM EST RP Workstation: [id number]   DG Chest Portable 1 View Result Date: 09/08/2024 CLINICAL DATA:  Cough. EXAM: PORTABLE CHEST 1 VIEW COMPARISON:  10/03/2023. FINDINGS: The heart size and mediastinal contours are unchanged. Known bilateral calcified pleural plaques. No appreciable acute airspace consolidation. No pleural effusion or pneumothorax. No acute osseous abnormality. IMPRESSION: 1. No acute cardiopulmonary findings. 2. Known calcified bilateral pleural plaques, compatible with asbestos related pleural disease. Electronically Signed   By: Harrietta Sherry M.D.   On: 09/08/2024 13:18     Scheduled Meds:  apixaban   5 mg Oral BID   feeding supplement  (GLUCERNA SHAKE)  237 mL Oral TID BM   insulin  aspart  0-9 Units Subcutaneous TID WC   pantoprazole   40 mg Oral Daily   predniSONE   25 mg Oral Once per day on Sunday Monday Tuesday Wednesday Thursday Saturday   [START ON 09/11/2024] predniSONE   65 mg Oral Q Fri   pyridostigmine   60 mg Oral TID   sodium chloride  flush  3 mL Intravenous Q12H   traZODone   100 mg Oral Once   Continuous Infusions:  potassium chloride  10 mEq (09/09/24 1030)     LOS: 1 day    Time spent:    Sigurd Pac, MD [facility]   09/09/2024, 11:25 AM

## 2024-09-10 ENCOUNTER — Inpatient Hospital Stay (HOSPITAL_COMMUNITY)

## 2024-09-10 DIAGNOSIS — R296 Repeated falls: Secondary | ICD-10-CM

## 2024-09-10 DIAGNOSIS — I4891 Unspecified atrial fibrillation: Secondary | ICD-10-CM

## 2024-09-10 DIAGNOSIS — R4182 Altered mental status, unspecified: Secondary | ICD-10-CM

## 2024-09-10 DIAGNOSIS — R531 Weakness: Secondary | ICD-10-CM

## 2024-09-10 DIAGNOSIS — R569 Unspecified convulsions: Secondary | ICD-10-CM | POA: Diagnosis not present

## 2024-09-10 DIAGNOSIS — Z7901 Long term (current) use of anticoagulants: Secondary | ICD-10-CM

## 2024-09-10 LAB — COMPREHENSIVE METABOLIC PANEL WITH GFR
ALT: 20 U/L (ref 0–44)
AST: 15 U/L (ref 15–41)
Albumin: 3.3 g/dL — ABNORMAL LOW (ref 3.5–5.0)
Alkaline Phosphatase: 43 U/L (ref 38–126)
Anion gap: 18 — ABNORMAL HIGH (ref 5–15)
BUN: 27 mg/dL — ABNORMAL HIGH (ref 8–23)
CO2: 13 mmol/L — ABNORMAL LOW (ref 22–32)
Calcium: 8.6 mg/dL — ABNORMAL LOW (ref 8.9–10.3)
Chloride: 112 mmol/L — ABNORMAL HIGH (ref 98–111)
Creatinine, Ser: 1.49 mg/dL — ABNORMAL HIGH (ref 0.61–1.24)
GFR, Estimated: 47 mL/min — ABNORMAL LOW (ref >60)
Glucose, Bld: 142 mg/dL — ABNORMAL HIGH (ref 70–99)
Potassium: 3.5 mmol/L (ref 3.5–5.1)
Sodium: 143 mmol/L (ref 135–145)
Total Bilirubin: 2.5 mg/dL — ABNORMAL HIGH (ref 0.0–1.2)
Total Protein: 5.3 g/dL — ABNORMAL LOW (ref 6.5–8.1)

## 2024-09-10 LAB — CBC
HCT: 39.6 % (ref 39.0–52.0)
Hemoglobin: 12.8 g/dL — ABNORMAL LOW (ref 13.0–17.0)
MCH: 29.4 pg (ref 26.0–34.0)
MCHC: 32.3 g/dL (ref 30.0–36.0)
MCV: 91 fL (ref 80.0–100.0)
Platelets: 130 K/uL — ABNORMAL LOW (ref 150–400)
RBC: 4.35 MIL/uL (ref 4.22–5.81)
RDW: 14.6 % (ref 11.5–15.5)
WBC: 8.8 K/uL (ref 4.0–10.5)
nRBC: 0 % (ref 0.0–0.2)

## 2024-09-10 LAB — URINE CULTURE: Culture: >=100000 — AB

## 2024-09-10 LAB — GLUCOSE, CAPILLARY
Glucose-Capillary: 184 mg/dL — ABNORMAL HIGH (ref 70–99)
Glucose-Capillary: 177 mg/dL — ABNORMAL HIGH (ref 70–99)
Glucose-Capillary: 156 mg/dL — ABNORMAL HIGH (ref 70–99)
Glucose-Capillary: 129 mg/dL — ABNORMAL HIGH (ref 70–99)

## 2024-09-10 LAB — HEMOGLOBIN A1C
Hgb A1c MFr Bld: 10.8 % — ABNORMAL HIGH (ref 4.8–5.6)
Mean Plasma Glucose: 263.26 mg/dL

## 2024-09-10 LAB — LACTIC ACID, PLASMA
Lactic Acid, Venous: 1.1 mmol/L (ref 0.5–1.9)
Lactic Acid, Venous: 1 mmol/L (ref 0.5–1.9)

## 2024-09-10 MED ORDER — SODIUM BICARBONATE 8.4 % IV SOLN
INTRAVENOUS | Status: DC
  Filled 2024-09-10: qty 150
  Filled 2024-09-10 (×4): qty 1000

## 2024-09-10 MED ORDER — SODIUM CHLORIDE 0.9 % IV SOLN
3.0000 g | Freq: Four times a day (QID) | INTRAVENOUS | Status: DC
  Administered 2024-09-10: 3 g via INTRAVENOUS
  Filled 2024-09-10: qty 8

## 2024-09-10 MED ORDER — HALOPERIDOL LACTATE 5 MG/ML IJ SOLN
2.0000 mg | Freq: Four times a day (QID) | INTRAMUSCULAR | Status: DC | PRN
  Administered 2024-09-10 – 2024-09-12 (×2): 2 mg via INTRAVENOUS
  Filled 2024-09-10 (×2): qty 1

## 2024-09-10 MED ORDER — LIVING WELL WITH DIABETES BOOK
Freq: Once | Status: AC
  Filled 2024-09-10: qty 1

## 2024-09-10 MED ORDER — METRONIDAZOLE 500 MG/100ML IV SOLN
500.0000 mg | Freq: Two times a day (BID) | INTRAVENOUS | Status: DC
  Administered 2024-09-10 – 2024-09-12 (×6): 500 mg via INTRAVENOUS
  Filled 2024-09-10 (×6): qty 100

## 2024-09-10 MED ORDER — SODIUM CHLORIDE 0.9 % IV SOLN
2.0000 g | INTRAVENOUS | Status: DC
  Administered 2024-09-10 – 2024-09-12 (×3): 2 g via INTRAVENOUS
  Filled 2024-09-10 (×3): qty 20

## 2024-09-10 MED ORDER — QUETIAPINE FUMARATE 25 MG PO TABS
25.0000 mg | ORAL_TABLET | Freq: Two times a day (BID) | ORAL | Status: DC
  Administered 2024-09-10: 25 mg via ORAL
  Filled 2024-09-10: qty 1

## 2024-09-10 NOTE — Progress Notes (Signed)
 RT NOTE: RT to room for NIF/VC parameters. PT given haldol  this AM for agitation and very sleepy so unable to perform with best effort. PT performed NIF/VC with following results:      Best  %Pred    FVC   1.08  22.2  FEV1   .90  25.7 FEV1/FVC  .83  ---- [id number] [L/s]  .85  35.1 PEF [L/s]  1.86  22.4 FET [s]   3.14  --- FEV1Q  1.80  ----  PT achieved NIF of -10 but effort was poor due to haldol  administration and sleepiness.

## 2024-09-10 NOTE — TOC CAGE-AID Note (Signed)
 Transition of Care [facility]) - CAGE-AID Screening   Patient Details  Name: Tyrone Newton MRN: [id number] Date of Birth: [birth year]  [facility] of [facility] CM/SW Contact:    Gerri Acre E Brain Honeycutt, LCSW Phone Number: 09/10/2024, [name]

## 2024-09-10 NOTE — Progress Notes (Addendum)
 PROGRESS NOTE        PATIENT DETAILS Name: Tyrone Newton Age: 78 y.o. Sex: male Date of Birth: 06-May-1946 Admit Date: 09/08/2024 Admitting Physician Marsa KATHEE Scurry, MD ERE:Tnnid, Jayson, MD  Brief Summary: Patient is a 78 y.o.  male with history of myasthenia gravis, CVA, PAF, CAD, prostate cancer-s/p prostatectomy-presented with difficulty ambulating, slurred speech-upon further eval-found to have acute CVA.  Further hospital course complicated by fever secondary to probable complicated UTI/aspiration pneumonia, and delirium.  Significant events: 12/9>> admit to [facility] 12/11>> confused overnight-febrile-repeat chest x-ray with possible left-sided consolidation-antibiotics switched to Unasyn  Significant studies: 12/9>> MRI brain: Acute infarcts in the cerebellum bilaterally.  Suspected acute punctate infarct in the left frontal/right frontal occipital lobes. 12/10>> CT head: No acute intracranial hemorrhage 12/10>> echo: EF 60-65% moderate MR, mild to moderate AR 12/10>> LDL: 31 12/11>> CXR: Interval progression of retrocardiac collapse/consolidation with small left pleural effusion 12/11>> A1c: 10.8  Significant microbiology data: 12/9>> urine culture: E. coli 12/9>> blood culture: No growth 12/11>> blood culture: No growth  Procedures: None  Consults: Neurology  Subjective: Restless-agitated this morning.  Objective: Vitals: Blood pressure 106/66, pulse 99, temperature 98.2 F (36.8 C), temperature source Axillary, resp. rate (!) 21, height 6' 3 (1.905 m), weight 79.4 kg, SpO2 99%.   Exam: Gen Exam: But very restless/agitated-confused HEENT:atraumatic, normocephalic Chest: B/L clear to auscultation anteriorly CVS:S1S2 regular Abdomen:soft non tender, non distended Extremities:no edema Neurology: Acute exam-moving upper extremities-has generalized weakness all over-not cooperating much to examine lower extremities but occasionally  seem to be moving.   Skin: no rash  Pertinent Labs/Radiology:    Latest Ref Rng & Units 09/10/2024    2:44 AM 09/09/2024    3:48 AM 09/08/2024    2:18 PM  CBC  WBC 4.0 - 10.5 K/uL 8.8  6.8    Hemoglobin 13.0 - 17.0 g/dL 87.1  87.9  88.3   Hematocrit 39.0 - 52.0 % 39.6  39.7  34.0   Platelets 150 - 400 K/uL 130  117      Lab Results  Component Value Date   NA 143 09/10/2024   K 3.5 09/10/2024   CL 112 (H) 09/10/2024   CO2 13 (L) 09/10/2024      Assessment/Plan: Acute CVA Likely embolic-suspicion for noncompliance with Eliquis  (given weight loss-concern for some occult malignancy) Currently very restless/agitated-will not be able to tolerate CTA head/neck For now-hold off on any further imaging-Will try to treat underlying infectious/delirium issues-before attempting in the next several days  Acute metabolic encephalopathy Probably secondary to complicated UTI/aspiration pneumonia Very restless/agitated this morning Starting low-dose Seroquel Check EEG but doubt seizures. Recent TSH/ammonia stable.  AKI/metabolic acidosis Likely hemodynamically mediated-secondary to poor oral intake/fe fever Switch IV fluid to D5 with bicarb  Aspiration pneumonia Febrile overnight-agitated/restless-suspected aspirated overnight-chest x-ray confirms enlarging left infiltrate Culture data as above Broaden antibiotics to Unasyn Currently n.p.o.-SLP following  Complicated UTI Should be covered with above antibiotics-apparently has a device that helps him urinate-prior history of prostate cancer-s/p prostatectomy  Right chest wall mass Felt to be a firm hematoma-has had prior imaging studies before this hospitalization-per spouse-completed a course of doxycycline. General surgery consulted 12/10-family refused open evacuation-supportive care for now.  PAF Telemetry monitoring Eliquis   CAD Eliquis  Holding Lipitor-given history of myasthenia gravis  Prior history of CVA See  above  DM 2 CBG  stable SSI  Recent Labs    09/09/24 1610 09/09/24 2121 09/10/24 0815  GLUCAP 105* 96 184*     History of myasthenia gravis Appears stable Given his restlessness/agitation-monitoring neph/FCD will be almost impossible Continue pyridostigmine /prednisone -follow clinically.  History of prostate cancer Device in place for urethral sphincter to urinate Supportive care  Anterior neck swelling Firm right sided neck mass-very hard to discern on exam-given agitation/delirium See below regarding plans for pan CT.  Weight loss Significant weight loss of almost 100 pounds in the past 1 year TSH stable Given recurrent embolic CVA-concern for hypercoagulable state from occult malignancy Once renal function/mentation permits-Will plan an CT.  Code status:   Code Status: Full Code   DVT Prophylaxis: apixaban  (ELIQUIS ) tablet 5 mg    Family Communication: Spouse at bedside  Disposition Plan: Status is: Inpatient Remains inpatient appropriate because: Severity of illness   Planned Discharge Destination:Skilled nursing facility   Diet: Diet Order             Diet NPO time specified Except for: Ice Chips, Sips with Meds  Diet effective now                     Antimicrobial agents: Anti-infectives (From admission, onward)    Start     Dose/Rate Route Frequency Ordered Stop   09/10/24 0800  Ampicillin-Sulbactam (UNASYN) 3 g in sodium chloride  0.9 % 100 mL IVPB        3 g 200 mL/hr over 30 Minutes Intravenous Every 6 hours 09/10/24 0648     09/09/24 1200  cefTRIAXone  (ROCEPHIN ) 1 g in sodium chloride  0.9 % 100 mL IVPB  Status:  Discontinued        1 g 200 mL/hr over 30 Minutes Intravenous Every 24 hours 09/09/24 1133 09/10/24 0644        MEDICATIONS: Scheduled Meds:  apixaban   5 mg Oral BID   feeding supplement (GLUCERNA SHAKE)  237 mL Oral TID BM   insulin  aspart  0-9 Units Subcutaneous TID WC   pantoprazole   40 mg Oral Daily   predniSONE    25 mg Oral Once per day on Sunday Monday Tuesday Wednesday Thursday Saturday   [START ON 09/11/2024] predniSONE   65 mg Oral Q Fri   pyridostigmine   60 mg Oral TID   QUEtiapine  25 mg Oral BID   sodium chloride  flush  3 mL Intravenous Q12H   Continuous Infusions:  ampicillin-sulbactam (UNASYN) IV 3 g (09/10/24 0825)   PRN Meds:.acetaminophen  **OR** acetaminophen , albuterol , artificial tears, haloperidol  lactate, iohexol, ketorolac , polyethylene glycol   I have personally reviewed following labs and imaging studies  LABORATORY DATA: CBC: Recent Labs  Lab 09/08/24 1117 09/08/24 1418 09/09/24 0348 09/10/24 0244  WBC 7.2  --  6.8 8.8  NEUTROABS 6.2  --   --   --   HGB 12.2* 11.6* 12.0* 12.8*  HCT 38.2* 34.0* 39.7 39.6  MCV 90.7  --  97.3 91.0  PLT 133*  --  117* 130*    Basic Metabolic Panel: Recent Labs  Lab 09/08/24 1117 09/08/24 1418 09/09/24 0348 09/10/24 0244  NA 140 140 143 143  K 3.7 3.7 3.3* 3.5  CL 104  --  116* 112*  CO2 25  --  14* 13*  GLUCOSE 149*  --  80 142*  BUN 24*  --  24* 27*  CREATININE 1.24  --  1.20 1.49*  CALCIUM 9.2  --  7.3* 8.6*    GFR: Estimated Creatinine Clearance:  44.4 mL/min (A) (by C-G formula based on SCr of 1.49 mg/dL (H)).  Liver Function Tests: Recent Labs  Lab 09/08/24 1117 09/09/24 0348 09/10/24 0244  AST 16 19 15   ALT 21 15 20   ALKPHOS 39 34* 43  BILITOT 2.5* 1.9* 2.5*  PROT 5.4* 4.3* 5.3*  ALBUMIN 3.5 2.6* 3.3*   No results for input(s): LIPASE, AMYLASE in the last 168 hours. Recent Labs  Lab 09/08/24 1411  AMMONIA 25    Coagulation Profile: No results for input(s): INR, PROTIME in the last 168 hours.  Cardiac Enzymes: No results for input(s): CKTOTAL, CKMB, CKMBINDEX, TROPONINI in the last 168 hours.  BNP (last 3 results) No results for input(s): PROBNP in the last 8760 hours.  Lipid Profile: Recent Labs    09/09/24 1557  CHOL 82  HDL 34*  LDLCALC 31  TRIG 83  CHOLHDL 2.4     Thyroid Function Tests: Recent Labs    09/08/24 1411  TSH 0.500    Anemia Panel: No results for input(s): VITAMINB12, FOLATE, FERRITIN, TIBC, IRON, RETICCTPCT in the last 72 hours.  Urine analysis:    Component Value Date/Time   COLORURINE AMBER (A) 09/08/2024 1500   APPEARANCEUR CLOUDY (A) 09/08/2024 1500   LABSPEC 1.023 09/08/2024 1500   PHURINE 5.0 09/08/2024 1500   GLUCOSEU >=500 (A) 09/08/2024 1500   HGBUR LARGE (A) 09/08/2024 1500   BILIRUBINUR NEGATIVE 09/08/2024 1500   KETONESUR 20 (A) 09/08/2024 1500   PROTEINUR NEGATIVE 09/08/2024 1500   UROBILINOGEN 1.0 07/28/2013 2109   NITRITE POSITIVE (A) 09/08/2024 1500   LEUKOCYTESUR SMALL (A) 09/08/2024 1500    Sepsis Labs: Lactic Acid, Venous    Component Value Date/Time   LATICACIDVEN 1.0 09/10/2024 0727    MICROBIOLOGY: Recent Results (from the past 240 hours)  Urine Culture     Status: Abnormal   Collection Time: 09/08/24  3:00 PM   Specimen: Urine, Random  Result Value Ref Range Status   Specimen Description URINE, RANDOM  Final   Special Requests   Final    NONE Reflexed from [id number] Performed at [facility], [address] [place], [place] [address]    Culture >=100,000 COLONIES/mL ESCHERICHIA COLI (A)  Final   Report Status 09/10/2024 FINAL  Final   Organism ID, Bacteria ESCHERICHIA COLI (A)  Final      Susceptibility   Escherichia coli - MIC*    AMPICILLIN >=32 RESISTANT Resistant     CEFAZOLIN (URINE) Value in next row Sensitive      8 SENSITIVEThis is a modified FDA-approved test that has been validated and its performance characteristics determined by the reporting laboratory.  This laboratory is certified under the Clinical Laboratory Improvement Amendments CLIA as qualified to perform high complexity clinical laboratory testing.    CEFEPIME Value in next row Sensitive      8 SENSITIVEThis is a modified FDA-approved test that has been validated and its performance  characteristics determined by the reporting laboratory.  This laboratory is certified under the Clinical Laboratory Improvement Amendments CLIA as qualified to perform high complexity clinical laboratory testing.    ERTAPENEM Value in next row Sensitive      8 SENSITIVEThis is a modified FDA-approved test that has been validated and its performance characteristics determined by the reporting laboratory.  This laboratory is certified under the Clinical Laboratory Improvement Amendments CLIA as qualified to perform high complexity clinical laboratory testing.    CEFTRIAXONE  Value in next row Sensitive  8 SENSITIVEThis is a modified FDA-approved test that has been validated and its performance characteristics determined by the reporting laboratory.  This laboratory is certified under the Clinical Laboratory Improvement Amendments CLIA as qualified to perform high complexity clinical laboratory testing.    CIPROFLOXACIN  Value in next row Resistant      8 SENSITIVEThis is a modified FDA-approved test that has been validated and its performance characteristics determined by the reporting laboratory.  This laboratory is certified under the Clinical Laboratory Improvement Amendments CLIA as qualified to perform high complexity clinical laboratory testing.    GENTAMICIN Value in next row Resistant      8 SENSITIVEThis is a modified FDA-approved test that has been validated and its performance characteristics determined by the reporting laboratory.  This laboratory is certified under the Clinical Laboratory Improvement Amendments CLIA as qualified to perform high complexity clinical laboratory testing.    NITROFURANTOIN Value in next row Intermediate      8 SENSITIVEThis is a modified FDA-approved test that has been validated and its performance characteristics determined by the reporting laboratory.  This laboratory is certified under the Clinical Laboratory Improvement Amendments CLIA as qualified to perform  high complexity clinical laboratory testing.    TRIMETH/SULFA Value in next row Resistant      8 SENSITIVEThis is a modified FDA-approved test that has been validated and its performance characteristics determined by the reporting laboratory.  This laboratory is certified under the Clinical Laboratory Improvement Amendments CLIA as qualified to perform high complexity clinical laboratory testing.    AMPICILLIN/SULBACTAM Value in next row Intermediate      8 SENSITIVEThis is a modified FDA-approved test that has been validated and its performance characteristics determined by the reporting laboratory.  This laboratory is certified under the Clinical Laboratory Improvement Amendments CLIA as qualified to perform high complexity clinical laboratory testing.    PIP/TAZO Value in next row Sensitive      <=4 SENSITIVEThis is a modified FDA-approved test that has been validated and its performance characteristics determined by the reporting laboratory.  This laboratory is certified under the Clinical Laboratory Improvement Amendments CLIA as qualified to perform high complexity clinical laboratory testing.    MEROPENEM Value in next row Sensitive      <=4 SENSITIVEThis is a modified FDA-approved test that has been validated and its performance characteristics determined by the reporting laboratory.  This laboratory is certified under the Clinical Laboratory Improvement Amendments CLIA as qualified to perform high complexity clinical laboratory testing.    * >=100,000 COLONIES/mL ESCHERICHIA COLI  Blood culture (routine x 2)     Status: None (Preliminary result)   Collection Time: 09/08/24  3:03 PM   Specimen: BLOOD  Result Value Ref Range Status   Specimen Description BLOOD SITE NOT SPECIFIED  Final   Special Requests   Final    BOTTLES DRAWN AEROBIC AND ANAEROBIC Blood Culture adequate volume   Culture   Final    NO GROWTH 2 DAYS Performed at [facility], [address] [place], [place]  [address]    Report Status PENDING  Incomplete  Blood culture (routine x 2)     Status: None (Preliminary result)   Collection Time: 09/08/24  3:21 PM   Specimen: BLOOD  Result Value Ref Range Status   Specimen Description BLOOD SITE NOT SPECIFIED  Final   Special Requests   Final    BOTTLES DRAWN AEROBIC ONLY Blood Culture results may not be optimal due to an inadequate volume of  blood received in culture bottles   Culture   Final    NO GROWTH 2 DAYS Performed at [facility], [address] [place], [place] [address]    Report Status PENDING  Incomplete  Culture, blood (Routine X 2) w Reflex to ID Panel     Status: None (Preliminary result)   Collection Time: 09/10/24  3:44 AM   Specimen: BLOOD RIGHT ARM  Result Value Ref Range Status   Specimen Description BLOOD RIGHT ARM  Final   Special Requests   Final    BOTTLES DRAWN AEROBIC AND ANAEROBIC Blood Culture adequate volume   Culture   Final    NO GROWTH < 12 HOURS Performed at [facility], [address], [place], [place] [address]    Report Status PENDING  Incomplete  Culture, blood (Routine X 2) w Reflex to ID Panel     Status: None (Preliminary result)   Collection Time: 09/10/24  3:54 AM   Specimen: BLOOD RIGHT ARM  Result Value Ref Range Status   Specimen Description BLOOD RIGHT ARM  Final   Special Requests   Final    BOTTLES DRAWN AEROBIC AND ANAEROBIC Blood Culture adequate volume   Culture   Final    NO GROWTH < 12 HOURS Performed at [facility], [address] [place], [place] [address]    Report Status PENDING  Incomplete    RADIOLOGY STUDIES/RESULTS: DG Chest Port 1 View Result Date: 09/10/2024 CLINICAL DATA:  Fever. EXAM: PORTABLE CHEST 1 VIEW COMPARISON:  09/08/2024 FINDINGS: Heart size is upper normal to mildly enlarged. Interval progression of retrocardiac collapse/consolidation with small left effusion. Right lung remains clear. Calcified pleural plaques again noted. Telemetry leads  overlie the chest. IMPRESSION: Interval progression of retrocardiac collapse/consolidation with small left effusion. Electronically Signed   By: Camellia Candle M.D.   On: 09/10/2024 05:07   CT HEAD WO CONTRAST ( ) Result Date: 09/09/2024 EXAM: CT HEAD WITHOUT 09/09/2024 03:21:54 PM TECHNIQUE: CT of the head was performed without the administration of intravenous contrast. Automated exposure control, iterative reconstruction, and/or weight based adjustment of the mA/kV was utilized to reduce the radiation dose to as low as reasonably achievable. COMPARISON: MRI brain 09/08/2024. CT head dated 09/08/2024. CLINICAL HISTORY: Stroke/TIA, determine embolic source. FINDINGS: BRAIN AND VENTRICLES: No acute intracranial hemorrhage. No mass effect or midline shift. No extra-axial fluid collection. There is overall similar mild scattered white matter hypodensities, which are nonspecific but most commonly represent chronic microvascular ischemic changes. 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 No evidence of a new acute territorial infarction. No hydrocephalus. ORBITS: Post right native opacular lens replacement. SINUSES AND MASTOIDS: No acute abnormality. SOFT TISSUES AND SKULL: No acute skull fracture. No acute soft tissue abnormality. IMPRESSION: 1. No acute intracranial hemorrhage or evidence of a new acute territorial infarction. 2. Previously identified recent bilateral cerebellar infarctions are seen more clearly on recent MRI brain 09/08/2024, see separate report for further relevant evaluation. Electronically signed by: prentice bybordi 09/09/2024 04:05 PM EST RP Workstation: [id number]   ECHOCARDIOGRAM COMPLETE Result Date: 09/09/2024    ECHOCARDIOGRAM REPORT   Patient Name:   KETIH GOODIE Date of Exam: 09/09/2024 Medical Rec #:  [id number]        Height:       75.0 in Accession  #:    [id number]       Weight:  175.0 lb Date of Birth:  04/26/1946        BSA:          2.073 m Patient Age:    78 years         BP:           128/78 mmHg Patient Gender: M                HR:           72 bpm. Exam Location:  Inpatient Procedure: 2D Echo (Both Spectral and Color Flow Doppler were utilized during            procedure). Indications:    Stroke  History:        Patient has no prior history of Echocardiogram examinations.                 Arrythmias:Atrial Fibrillation.  Sonographer:    Charmaine Gaskins Referring Phys: [id number] JINDONG XU IMPRESSIONS  1. Left ventricular ejection fraction, by estimation, is 60 to 65%. The left ventricle has normal function. The left ventricle has no regional wall motion abnormalities. Left ventricular diastolic parameters are indeterminate.  2. Right ventricular systolic function is normal. The right ventricular size is normal. There is normal pulmonary artery systolic pressure.  3. The mitral valve is normal in structure. Moderate mitral valve regurgitation. No evidence of mitral stenosis.  4. Tricuspid valve regurgitation is mild to moderate.  5. The aortic valve is tricuspid. There is mild calcification of the aortic valve. Aortic valve regurgitation is mild to moderate. Aortic valve sclerosis is present, with no evidence of aortic valve stenosis.  6. Aortic dilatation noted. Aneurysm of the ascending aorta, measuring 45 mm.  7. The inferior vena cava is normal in size with greater than 50% respiratory variability, suggesting right atrial pressure of 3 mmHg. Conclusion(s)/Recommendation(s): No intracardiac source of embolism detected on this transthoracic study. Consider a transesophageal echocardiogram to exclude cardiac source of embolism if clinically indicated. FINDINGS  Left Ventricle: Left ventricular ejection fraction, by estimation, is 60 to 65%. The left ventricle has normal function. The left ventricle has no regional wall motion abnormalities. The left  ventricular internal cavity size was normal in size. There is  no left ventricular hypertrophy. Left ventricular diastolic parameters are indeterminate. Right Ventricle: The right ventricular size is normal. No increase in right ventricular wall thickness. Right ventricular systolic function is normal. There is normal pulmonary artery systolic pressure. The tricuspid regurgitant velocity is 2.56 m/s, and  with an assumed right atrial pressure of 3 mmHg, the estimated right ventricular systolic pressure is 29.2 mmHg. Left Atrium: Left atrial size was normal in size. Right Atrium: Right atrial size was normal in size. Pericardium: There is no evidence of pericardial effusion. Mitral Valve: The mitral valve is normal in structure. Moderate mitral valve regurgitation. No evidence of mitral valve stenosis. Tricuspid Valve: The tricuspid valve is normal in structure. Tricuspid valve regurgitation is mild to moderate. No evidence of tricuspid stenosis. Aortic Valve: The aortic valve is tricuspid. There is mild calcification of the aortic valve. Aortic valve regurgitation is mild to moderate. Aortic valve sclerosis is present, with no evidence of aortic valve stenosis. Pulmonic Valve: The pulmonic valve was normal in structure. Pulmonic valve regurgitation is mild. No evidence of pulmonic stenosis. Aorta: Aortic dilatation noted. There is an aneurysm involving the ascending aorta measuring 45 mm. Venous: The inferior vena cava is normal in size with greater than 50% respiratory variability, suggesting right  atrial pressure of 3 mmHg. IAS/Shunts: No atrial level shunt detected by color flow Doppler.  LEFT VENTRICLE PLAX 2D LVIDd:         4.20 cm     Diastology LVIDs:         3.00 cm     LV e' medial:    12.70 cm/s LV PW:         1.00 cm     LV E/e' medial:  7.8 LV IVS:        1.00 cm     LV e' lateral:   15.40 cm/s LVOT diam:     2.20 cm     LV E/e' lateral: 6.5 LVOT Area:     3.80 cm  LV Volumes (MOD) LV vol d, MOD A2C:  70.7 ml LV vol d, MOD A4C: 86.5 ml LV vol s, MOD A2C: 27.7 ml LV vol s, MOD A4C: 29.1 ml LV SV MOD A2C:     43.0 ml LV SV MOD A4C:     86.5 ml LV SV MOD BP:      50.5 ml RIGHT VENTRICLE RV Basal diam:  3.70 cm RV Mid diam:    4.20 cm RV S prime:     17.70 cm/s LEFT ATRIUM              Index        RIGHT ATRIUM           Index LA diam:        3.40 cm  1.64 cm/m   RA Area:     26.80 cm LA Vol (A2C):   111.0 ml 53.54 ml/m  RA Volume:   79.00 ml  38.10 ml/m LA Vol (A4C):   104.0 ml 50.16 ml/m LA Biplane Vol: 108.0 ml 52.09 ml/m   AORTA Ao Root diam: 3.70 cm Ao Asc diam:  4.62 cm MITRAL VALVE               TRICUSPID VALVE MV Area (PHT): 4.38 cm    TR Peak grad:   26.2 mmHg MV Decel Time: 173 msec    TR Vmax:        256.00 cm/s MV E velocity: 99.50 cm/s                            SHUNTS                            Systemic Diam: 2.20 cm Oneil Parchment MD Electronically signed by Oneil Parchment MD Signature Date/Time: 09/09/2024/3:16:51 PM    Final    MR BRAIN WO CONTRAST Result Date: 09/08/2024 EXAM: MRI Brain Without Contrast 09/08/2024 06:48:39 PM TECHNIQUE: Multiplanar multisequence MRI of the head/brain was performed without the administration of intravenous contrast. COMPARISON: CT head earlier today. 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 Edema without mass effect. No intracranial hemorrhage. No mass. No midline shift. No hydrocephalus. The sella is unremarkable. Normal flow voids. ORBITS: No acute abnormality. SINUSES AND MASTOIDS: No acute abnormality. BONES AND SOFT TISSUES: Normal marrow signal. No acute soft tissue abnormality. IMPRESSION: 1. 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 2. Additional suspected punctate acute infarcts the left frontal and right occipital lobes. Electronically signed by: Gilmore Molt MD 09/08/2024 07:57 PM EST RP Workstation: [id number]   CT Head Wo Contrast Result  Date: 09/08/2024  EXAM: CT HEAD WITHOUT 09/08/2024 01:50:57 PM TECHNIQUE: CT of the head was performed without the administration of intravenous contrast. Automated exposure control, iterative reconstruction, and/or weight based adjustment of the mA/kV was utilized to reduce the radiation dose to as low as reasonably achievable. COMPARISON: None available. CLINICAL HISTORY: Transient ischemic attack (TIA) FINDINGS: BRAIN AND VENTRICLES: No acute intracranial hemorrhage. No mass effect or midline shift. No extra-axial fluid collection. No evidence of acute infarct. No hydrocephalus. Chronic ischemic white matter changes. Atherosclerotic calcifications in cavernous internal carotid arteries. ORBITS: Right lens replacement. SINUSES AND MASTOIDS: No acute abnormality. SOFT TISSUES AND SKULL: No acute skull fracture. No acute soft tissue abnormality. IMPRESSION: 1. No acute intracranial abnormality. 2. Chronic ischemic white matter changes. Electronically signed by: Franky Stanford MD 09/08/2024 02:59 PM EST RP Workstation: [id number]   DG Chest Portable 1 View Result Date: 09/08/2024 CLINICAL DATA:  Cough. EXAM: PORTABLE CHEST 1 VIEW COMPARISON:  10/03/2023. FINDINGS: The heart size and mediastinal contours are unchanged. Known bilateral calcified pleural plaques. No appreciable acute airspace consolidation. No pleural effusion or pneumothorax. No acute osseous abnormality. IMPRESSION: 1. No acute cardiopulmonary findings. 2. Known calcified bilateral pleural plaques, compatible with asbestos related pleural disease. Electronically Signed   By: Harrietta Sherry M.D.   On: 09/08/2024 13:18     LOS: 2 days   Donalda Applebaum, MD  [facility]    To contact the attending provider between 7A-7P or the covering provider during after hours 7P-7A, please log into the web site [email or web address] and access using universal [place] password for that web site. If you do not have the password, please call the hospital  operator.  09/10/2024, 9:46 AM

## 2024-09-10 NOTE — Progress Notes (Signed)
 OT Cancellation Note  Patient Details Name: Tyrone Newton MRN: [id number] DOB: 01-16-1946   Cancelled Treatment:    Reason Eval/Treat Not Completed: Patient at procedure or test/ unavailable  Paytin Ramakrishnan D Kaileen Bronkema 09/10/2024, 1:44 PM 09/10/2024  RP, OTR/L  Acute Rehabilitation Services  Office:  [phone]

## 2024-09-10 NOTE — Progress Notes (Signed)
 Ceftriaxone  was changed to Unasyn to cover for asp PNA. Urine culture came back with resistant e.coli. D/w Dr. Raenelle, we will change unasyn back to ceftriaxone /flagyl.  Sergio Batch, PharmD, BCIDP, AAHIVP, CPP Infectious Disease Pharmacist 09/10/2024 11:22 AM

## 2024-09-10 NOTE — Progress Notes (Signed)
 [facility] night cross cover note:   I was notified by the patient's RN that the patient has noted a temperature of 102.1, which appears to be a new fever, at least over the course of the last 24 hours.  Ice packs applied.  Will provide a dose of IV Toradol  via existing prn order for such.  Hospital course is notable for urinary tract infection, for which the patient has been on Rocephin .  There is concern over potential aspiration of applesauce earlier.  In light of the latter and development of new fever, will check updated chest x-ray, blood cultures, lactic acid level.     Eva Pore, DO Hospitalist

## 2024-09-10 NOTE — Progress Notes (Addendum)
 EEG complete - results pending

## 2024-09-10 NOTE — Progress Notes (Signed)
 STROKE TEAM PROGRESS NOTE   SUBJECTIVE (INTERVAL HISTORY) His wife and son are at the bedside. Pt is getting EEG done. Pt agitated, restless, wife and son are holding his arms. Per RN, pt was also agitated and delirium overnight, was given haldol  and seroquel. Found to have metabolic acidosis, PNA and UTI. Urine culture showed resistant E Coli. Not able to have CTA done due to agitation.    OBJECTIVE Temp:  [97.7 F (36.5 C)-102.1 F (38.9 C)] 97.7 F (36.5 C) (12/11 1200) Pulse Rate:  [88-99] 99 (12/11 0431) Cardiac Rhythm: Atrial fibrillation (12/11 0705) Resp:  [19-24] 24 (12/11 1200) BP: (105-119)/(66-97) 105/67 (12/11 1200) SpO2:  [99 %] 99 % (12/11 0431)  Recent Labs  Lab 09/09/24 1449 09/09/24 1610 09/09/24 2121 09/10/24 0815 09/10/24 1158  GLUCAP 99 105* 96 184* 177*   Recent Labs  Lab 09/08/24 1117 09/08/24 1418 09/09/24 0348 09/10/24 0244  NA 140 140 143 143  K 3.7 3.7 3.3* 3.5  CL 104  --  116* 112*  CO2 25  --  14* 13*  GLUCOSE 149*  --  80 142*  BUN 24*  --  24* 27*  CREATININE 1.24  --  1.20 1.49*  CALCIUM 9.2  --  7.3* 8.6*   Recent Labs  Lab 09/08/24 1117 09/09/24 0348 09/10/24 0244  AST 16 19 15   ALT 21 15 20   ALKPHOS 39 34* 43  BILITOT 2.5* 1.9* 2.5*  PROT 5.4* 4.3* 5.3*  ALBUMIN 3.5 2.6* 3.3*   Recent Labs  Lab 09/08/24 1117 09/08/24 1418 09/09/24 0348 09/10/24 0244  WBC 7.2  --  6.8 8.8  NEUTROABS 6.2  --   --   --   HGB 12.2* 11.6* 12.0* 12.8*  HCT 38.2* 34.0* 39.7 39.6  MCV 90.7  --  97.3 91.0  PLT 133*  --  117* 130*   No results for input(s): CKTOTAL, CKMB, CKMBINDEX, TROPONINI in the last 168 hours. No results for input(s): LABPROT, INR in the last 72 hours. Recent Labs    09/08/24 1500  COLORURINE AMBER*  LABSPEC 1.023  PHURINE 5.0  GLUCOSEU >=500*  HGBUR LARGE*  BILIRUBINUR NEGATIVE  KETONESUR 20*  PROTEINUR NEGATIVE  NITRITE POSITIVE*  LEUKOCYTESUR SMALL*       Component Value Date/Time   CHOL  82 09/09/2024 1557   TRIG 83 09/09/2024 1557   HDL 34 (L) 09/09/2024 1557   CHOLHDL 2.4 09/09/2024 1557   VLDL 17 09/09/2024 1557   LDLCALC 31 09/09/2024 1557   Lab Results  Component Value Date   HGBA1C 10.8 (H) 09/10/2024   No results found for: LABOPIA, COCAINSCRNUR, LABBENZ, AMPHETMU, THCU, LABBARB  Recent Labs  Lab 09/08/24 1117  ETH <15    I have personally reviewed the radiological images below and agree with the radiology interpretations.  EEG adult Result Date: 09/10/2024 Tyrone Arlin KIDD, MD     09/10/2024  5:11 PM Patient Name: Tyrone Newton MRN: [id number] Epilepsy Attending: Arlin Newton Tyrone Referring Physician/Provider: Raenelle Donalda HERO, MD Date: 09/10/2024 Duration: 22.15 mins Patient history: 78yo M with ams. EEG to evaluate for seizure Level of alertness: Awake AEDs during EEG study: None Technical aspects: This EEG study was done with scalp electrodes positioned according to the 10-20 International system of electrode placement. Electrical activity was reviewed with band pass filter of 1-70Hz , sensitivity of 7 uV/mm, display speed of 41mm/sec with a 60Hz  notched filter applied as appropriate. EEG data were recorded continuously and digitally stored.  Video  monitoring was available and reviewed as appropriate. Description: EEG showed continuous generalized predominantly 5 to 6 Hz theta slowing admixed with intermittent 2-3hz  delta slowing. Hyperventilation and photic stimulation were not performed.   ABNORMALITY - Continuous slow, generalized IMPRESSION: This study is suggestive of generalized cerebral dysfunction ( encephalopathy). No seizures or epileptiform discharges were seen throughout the recording. Arlin MALVA Krebs   DG Chest Port 1 View Result Date: 09/10/2024 CLINICAL DATA:  Fever. EXAM: PORTABLE CHEST 1 VIEW COMPARISON:  09/08/2024 FINDINGS: Heart size is upper normal to mildly enlarged. Interval progression of retrocardiac collapse/consolidation  with small left effusion. Right lung remains clear. Calcified pleural plaques again noted. Telemetry leads overlie the chest. IMPRESSION: Interval progression of retrocardiac collapse/consolidation with small left effusion. Electronically Signed   By: Camellia Candle M.D.   On: 09/10/2024 05:07   CT HEAD WO CONTRAST ( ) Result Date: 09/09/2024 EXAM: CT HEAD WITHOUT 09/09/2024 03:21:54 PM TECHNIQUE: CT of the head was performed without the administration of intravenous contrast. Automated exposure control, iterative reconstruction, and/or weight based adjustment of the mA/kV was utilized to reduce the radiation dose to as low as reasonably achievable. COMPARISON: MRI brain 09/08/2024. CT head dated 09/08/2024. CLINICAL HISTORY: Stroke/TIA, determine embolic source. FINDINGS: BRAIN AND VENTRICLES: No acute intracranial hemorrhage. No mass effect or midline shift. No extra-axial fluid collection. There is overall similar mild scattered white matter hypodensities, which are nonspecific but most commonly represent chronic microvascular ischemic changes. 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 No evidence of a new acute territorial infarction. No hydrocephalus. ORBITS: Post right native opacular lens replacement. SINUSES AND MASTOIDS: No acute abnormality. SOFT TISSUES AND SKULL: No acute skull fracture. No acute soft tissue abnormality. IMPRESSION: 1. No acute intracranial hemorrhage or evidence of a new acute territorial infarction. 2. Previously identified recent bilateral cerebellar infarctions are seen more clearly on recent MRI brain 09/08/2024, see separate report for further relevant evaluation. Electronically signed by: prentice bybordi 09/09/2024 04:05 PM EST RP Workstation: [id number]   ECHOCARDIOGRAM COMPLETE Result Date: 09/09/2024    ECHOCARDIOGRAM REPORT   Patient Name:    Tyrone Newton Date of Exam: 09/09/2024 Medical Rec #:  [id number]        Height:       75.0 in Accession #:    [id number]       Weight:       175.0 lb Date of Birth:  1946/06/01        BSA:          2.073 m Patient Age:    78 years         BP:           128/78 mmHg Patient Gender: M                HR:           72 bpm. Exam Location:  Inpatient Procedure: 2D Echo (Both Spectral and Color Flow Doppler were utilized during            procedure). Indications:    Stroke  History:        Patient has no prior history of Echocardiogram examinations.                 Arrythmias:Atrial Fibrillation.  Sonographer:    Charmaine Gaskins Referring Phys: [id number] Kaydance Bowie IMPRESSIONS  1. Left ventricular ejection fraction, by estimation, is 60 to 65%.  The left ventricle has normal function. The left ventricle has no regional wall motion abnormalities. Left ventricular diastolic parameters are indeterminate.  2. Right ventricular systolic function is normal. The right ventricular size is normal. There is normal pulmonary artery systolic pressure.  3. The mitral valve is normal in structure. Moderate mitral valve regurgitation. No evidence of mitral stenosis.  4. Tricuspid valve regurgitation is mild to moderate.  5. The aortic valve is tricuspid. There is mild calcification of the aortic valve. Aortic valve regurgitation is mild to moderate. Aortic valve sclerosis is present, with no evidence of aortic valve stenosis.  6. Aortic dilatation noted. Aneurysm of the ascending aorta, measuring 45 mm.  7. The inferior vena cava is normal in size with greater than 50% respiratory variability, suggesting right atrial pressure of 3 mmHg. Conclusion(s)/Recommendation(s): No intracardiac source of embolism detected on this transthoracic study. Consider a transesophageal echocardiogram to exclude cardiac source of embolism if clinically indicated. FINDINGS  Left Ventricle: Left ventricular ejection fraction, by estimation, is 60 to 65%. The  left ventricle has normal function. The left ventricle has no regional wall motion abnormalities. The left ventricular internal cavity size was normal in size. There is  no left ventricular hypertrophy. Left ventricular diastolic parameters are indeterminate. Right Ventricle: The right ventricular size is normal. No increase in right ventricular wall thickness. Right ventricular systolic function is normal. There is normal pulmonary artery systolic pressure. The tricuspid regurgitant velocity is 2.56 m/s, and  with an assumed right atrial pressure of 3 mmHg, the estimated right ventricular systolic pressure is 29.2 mmHg. Left Atrium: Left atrial size was normal in size. Right Atrium: Right atrial size was normal in size. Pericardium: There is no evidence of pericardial effusion. Mitral Valve: The mitral valve is normal in structure. Moderate mitral valve regurgitation. No evidence of mitral valve stenosis. Tricuspid Valve: The tricuspid valve is normal in structure. Tricuspid valve regurgitation is mild to moderate. No evidence of tricuspid stenosis. Aortic Valve: The aortic valve is tricuspid. There is mild calcification of the aortic valve. Aortic valve regurgitation is mild to moderate. Aortic valve sclerosis is present, with no evidence of aortic valve stenosis. Pulmonic Valve: The pulmonic valve was normal in structure. Pulmonic valve regurgitation is mild. No evidence of pulmonic stenosis. Aorta: Aortic dilatation noted. There is an aneurysm involving the ascending aorta measuring 45 mm. Venous: The inferior vena cava is normal in size with greater than 50% respiratory variability, suggesting right atrial pressure of 3 mmHg. IAS/Shunts: No atrial level shunt detected by color flow Doppler.  LEFT VENTRICLE PLAX 2D LVIDd:         4.20 cm     Diastology LVIDs:         3.00 cm     LV e' medial:    12.70 cm/s LV PW:         1.00 cm     LV E/e' medial:  7.8 LV IVS:        1.00 cm     LV e' lateral:   15.40 cm/s LVOT  diam:     2.20 cm     LV E/e' lateral: 6.5 LVOT Area:     3.80 cm  LV Volumes (MOD) LV vol d, MOD A2C: 70.7 ml LV vol d, MOD A4C: 86.5 ml LV vol s, MOD A2C: 27.7 ml LV vol s, MOD A4C: 29.1 ml LV SV MOD A2C:     43.0 ml LV SV MOD A4C:     86.5  ml LV SV MOD BP:      50.5 ml RIGHT VENTRICLE RV Basal diam:  3.70 cm RV Mid diam:    4.20 cm RV S prime:     17.70 cm/s LEFT ATRIUM              Index        RIGHT ATRIUM           Index LA diam:        3.40 cm  1.64 cm/m   RA Area:     26.80 cm LA Vol (A2C):   111.0 ml 53.54 ml/m  RA Volume:   79.00 ml  38.10 ml/m LA Vol (A4C):   104.0 ml 50.16 ml/m LA Biplane Vol: 108.0 ml 52.09 ml/m   AORTA Ao Root diam: 3.70 cm Ao Asc diam:  4.62 cm MITRAL VALVE               TRICUSPID VALVE MV Area (PHT): 4.38 cm    TR Peak grad:   26.2 mmHg MV Decel Time: 173 msec    TR Vmax:        256.00 cm/s MV E velocity: 99.50 cm/s                            SHUNTS                            Systemic Diam: 2.20 cm Oneil Parchment MD Electronically signed by Oneil Parchment MD Signature Date/Time: 09/09/2024/3:16:51 PM    Final    MR BRAIN WO CONTRAST Result Date: 09/08/2024 EXAM: MRI Brain Without Contrast 09/08/2024 06:48:39 PM TECHNIQUE: Multiplanar multisequence MRI of the head/brain was performed without the administration of intravenous contrast. COMPARISON: CT head earlier today. 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 Edema without mass effect. No intracranial hemorrhage. No mass. No midline shift. No hydrocephalus. The sella is unremarkable. Normal flow voids. ORBITS: No acute abnormality. SINUSES AND MASTOIDS: No acute abnormality. BONES AND SOFT TISSUES: Normal marrow signal. No acute soft tissue abnormality. IMPRESSION: 1. 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 2. Additional suspected punctate acute infarcts the left frontal and right occipital lobes. Electronically signed  by: Gilmore Molt MD 09/08/2024 07:57 PM EST RP Workstation: [id number]   CT Head Wo Contrast Result Date: 09/08/2024 EXAM: CT HEAD WITHOUT 09/08/2024 01:50:57 PM TECHNIQUE: CT of the head was performed without the administration of intravenous contrast. Automated exposure control, iterative reconstruction, and/or weight based adjustment of the mA/kV was utilized to reduce the radiation dose to as low as reasonably achievable. COMPARISON: None available. CLINICAL HISTORY: Transient ischemic attack (TIA) FINDINGS: BRAIN AND VENTRICLES: No acute intracranial hemorrhage. No mass effect or midline shift. No extra-axial fluid collection. No evidence of acute infarct. No hydrocephalus. Chronic ischemic white matter changes. Atherosclerotic calcifications in cavernous internal carotid arteries. ORBITS: Right lens replacement. SINUSES AND MASTOIDS: No acute abnormality. SOFT TISSUES AND SKULL: No acute skull fracture. No acute soft tissue abnormality. IMPRESSION: 1. No acute intracranial abnormality. 2. Chronic ischemic white matter changes. Electronically signed by: Franky Stanford MD 09/08/2024 02:59 PM EST RP Workstation: [id number]   DG Chest Portable 1 View Result Date: 09/08/2024 CLINICAL DATA:  Cough. EXAM: PORTABLE CHEST 1 VIEW COMPARISON:  10/03/2023. FINDINGS: The heart size and mediastinal contours are unchanged. Known bilateral calcified pleural plaques. No appreciable acute  airspace consolidation. No pleural effusion or pneumothorax. No acute osseous abnormality. IMPRESSION: 1. No acute cardiopulmonary findings. 2. Known calcified bilateral pleural plaques, compatible with asbestos related pleural disease. Electronically Signed   By: Harrietta Sherry M.D.   On: 09/08/2024 13:18     PHYSICAL EXAM  Temp:  [97.7 F (36.5 C)-102.1 F (38.9 C)] 97.7 F (36.5 C) (12/11 1200) Pulse Rate:  [88-99] 99 (12/11 0431) Resp:  [19-24] 24 (12/11 1200) BP: (105-119)/(66-97) 105/67 (12/11 1200) SpO2:  [99 %] 99  % (12/11 0431)  General - Well nourished, well developed, agitated and restless  Ophthalmologic - fundi not visualized due to noncooperation.  Cardiovascular - irregularly irregular heart rate and rhythm.  Neuro - eyes closed, but agitated and restless, briefly open eyes to voice and resistant to eye opening. Not answer questions, but kept saying no stop in severely dysarthria voice. Bilateral UEs at least 4/5 held by family due to agitation. Bilaterally LEs 3-/5. Sensation and coordination not corporative, gait not tested.    ASSESSMENT/PLAN Mr. CARLTON BUSKEY is a 78 y.o. male with history of myasthenia gravis on prednisone  and Mestinon , hypertension, A-fib on Eliquis , CAD, AV block type I, gout, prostate cancer status post surgery admitted for abrupt functional decline with whole body weakness and soft voice with falling at home.     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 Additional suspected punctate acute infarcts the left frontal and right occipital lobes. CTA head and neck pending May consider pan CT to rule out advanced malignancy if workup unrevealing 2D Echo EF 60 to 65% LDL 31 HgbA1c 10.7 Eliquis  for VTE prophylaxis Eliquis  (apixaban ) daily prior to admission, now on Eliquis  (apixaban ) daily.  Ongoing aggressive stroke risk factor management Therapy recommendations: SNF Disposition: Pending  Myasthenia gravis Diagnosed 2 years ago in [place] Was on Vyvgart in the past but not effective per family Now on prednisone  and Mestinon  No respiratory distress now DC Lipitor due to potential worsening MG  General Functional decline Delirium  Complicated by  weight loss, UTI, uncontrolled diabetes and Lipitor use DC Lipitor S/p seroquel and haldolol Given history of prostate cancer, recommend to rule out metastasis if workup unrevealing PT and OT  AF Rate controlled On Eliquis  at home but with doubtful compliance given patient taking care of medication by himself, however family insists compliance Continue Eliquis   Diabetes HgbA1c 10.7 goal < 7.0 Uncontrolled CBG monitoring SSI DM education and close PCP follow up  Hypertension Stable Avoid low BP Long term BP goal normotensive  Hyperlipidemia Home meds: Lipitor 40 LDL 31, goal < 70 Now on no statin given MG with worsening weakness  UTI PNA 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 Interval progression of retrocardiac collapse/consolidation with small left effusion. On Rocephin  and flagyl  Other Stroke Risk Factors Advanced age CAD  Other Active Problems AV block type I Prostate cancer status post surgery Right lateral chest hematoma, surgery on board  Hospital day # 2  I discussed with Dr. Raenelle.    Ary Cummins, MD PhD Stroke Neurology 09/10/2024 5:13 PM  To contact Stroke Continuity provider, please refer to [email or web address]. After hours, contact General Neurology

## 2024-09-10 NOTE — Progress Notes (Signed)
 Patient unable to perform NIF and VC at this time. RT will attempt at the next scheduled time.

## 2024-09-10 NOTE — Procedures (Signed)
 Patient Name: Tyrone Newton  MRN: [id number]  Epilepsy Attending: Arlin MALVA Krebs  Referring Physician/Provider: Raenelle Donalda HERO, MD  Date: 09/10/2024 Duration: 22.15 mins  Patient history: 78yo M with ams. EEG to evaluate for seizure  Level of alertness: Awake  AEDs during EEG study: None  Technical aspects: This EEG study was done with scalp electrodes positioned according to the 10-20 International system of electrode placement. Electrical activity was reviewed with band pass filter of 1-70Hz , sensitivity of 7 uV/mm, display speed of 49mm/sec with a 60Hz  notched filter applied as appropriate. EEG data were recorded continuously and digitally stored.  Video monitoring was available and reviewed as appropriate.  Description: EEG showed continuous generalized predominantly 5 to 6 Hz theta slowing admixed with intermittent 2-3hz  delta slowing. Hyperventilation and photic stimulation were not performed.     ABNORMALITY - Continuous slow, generalized  IMPRESSION: This study is suggestive of generalized cerebral dysfunction ( encephalopathy). No seizures or epileptiform discharges were seen throughout the recording.  Tyrone Newton

## 2024-09-10 NOTE — Progress Notes (Signed)
 Speech Language Pathology  Patient Details Name: Tyrone Newton MRN: [id number] DOB: 23-Jul-1946 Today's Date: 09/10/2024 Time:  -     Pt seen in modified fluoro suite however unable to initiate study. Pt not able to participate- no response to therapist, significantly decreased awareness, no verbalizations or command following. Attempted dry spoon however he initially moved head back, side to side refusing. Eventually he did allow spoon into mouth not currently appropriate. Had Haldol  earlier this am however RN states he was awake just prior to coming down. Pt's ability waxes and wanes in short periods. Spoke with RN who can attempt crushed meds in puree later if appropriate from alertness and cognitive standpoint. Asked RN to pass message to hold on Haldol  in the early morning if able and SLP will see pt at bedside to determine appropriateness to re-attempt MBS tomorrow. Spoke with wife and MD who will order Cortrak tomorrow.    [name]  09/10/2024, 1:38 PM

## 2024-09-10 NOTE — Plan of Care (Signed)

## 2024-09-10 NOTE — Progress Notes (Signed)
 [facility] Progress Note     Subjective: CC:  No new complaints. Wife at bedside reporting most of history. Right chest wall tenderness stable.   Objective: Vital signs in last 24 hours: Temp:  [98.2 F (36.8 C)-102.1 F (38.9 C)] 98.2 F (36.8 C) (12/11 0800) Pulse Rate:  [77-99] 99 (12/11 0431) Resp:  [18-25] 21 (12/11 0431) BP: (106-126)/(65-97) 106/66 (12/11 0431) SpO2:  [94 %-99 %] 99 % (12/11 0431) Last BM Date : 09/08/24  Intake/Output from previous day: No intake/output data recorded. Intake/Output this shift: Total I/O In: 5 [I.V.:5] Out: -   PE: Gen:  Alert, chronically ill appearing Card:  Regular rate and rhythm Pulm:  slightly labored respirations ORA Right posterior-lateral chest wall with firm hematoma. No cellulitis or fluctuance. No warmth Abd: Soft, non-tender, non-distended Skin: warm and dry, no rashes  Psych: A&Ox3   Lab Results:  Recent Labs    09/09/24 0348 09/10/24 0244  WBC 6.8 8.8  HGB 12.0* 12.8*  HCT 39.7 39.6  PLT 117* 130*   BMET Recent Labs    09/09/24 0348 09/10/24 0244  NA 143 143  K 3.3* 3.5  CL 116* 112*  CO2 14* 13*  GLUCOSE 80 142*  BUN 24* 27*  CREATININE 1.20 1.49*  CALCIUM 7.3* 8.6*   PT/INR No results for input(s): LABPROT, INR in the last 72 hours. CMP     Component Value Date/Time   NA 143 09/10/2024 0244   K 3.5 09/10/2024 0244   CL 112 (H) 09/10/2024 0244   CO2 13 (L) 09/10/2024 0244   GLUCOSE 142 (H) 09/10/2024 0244   BUN 27 (H) 09/10/2024 0244   CREATININE 1.49 (H) 09/10/2024 0244   CALCIUM 8.6 (L) 09/10/2024 0244   PROT 5.3 (L) 09/10/2024 0244   ALBUMIN 3.3 (L) 09/10/2024 0244   AST 15 09/10/2024 0244   ALT 20 09/10/2024 0244   ALKPHOS 43 09/10/2024 0244   BILITOT 2.5 (H) 09/10/2024 0244   GFRNONAA 47 (L) 09/10/2024 0244   GFRAA 58 (L) 01/05/2018 0945   Lipase  No results found for: LIPASE     Studies/Results: DG Chest Port 1 View Result Date:  09/10/2024 CLINICAL DATA:  Fever. EXAM: PORTABLE CHEST 1 VIEW COMPARISON:  09/08/2024 FINDINGS: Heart size is upper normal to mildly enlarged. Interval progression of retrocardiac collapse/consolidation with small left effusion. Right lung remains clear. Calcified pleural plaques again noted. Telemetry leads overlie the chest. IMPRESSION: Interval progression of retrocardiac collapse/consolidation with small left effusion. Electronically Signed   By: Camellia Candle M.D.   On: 09/10/2024 05:07   CT HEAD WO CONTRAST ( ) Result Date: 09/09/2024 EXAM: CT HEAD WITHOUT 09/09/2024 03:21:54 PM TECHNIQUE: CT of the head was performed without the administration of intravenous contrast. Automated exposure control, iterative reconstruction, and/or weight based adjustment of the mA/kV was utilized to reduce the radiation dose to as low as reasonably achievable. COMPARISON: MRI brain 09/08/2024. CT head dated 09/08/2024. CLINICAL HISTORY: Stroke/TIA, determine embolic source. FINDINGS: BRAIN AND VENTRICLES: No acute intracranial hemorrhage. No mass effect or midline shift. No extra-axial fluid collection. There is overall similar mild scattered white matter hypodensities, which are nonspecific but most commonly represent chronic microvascular ischemic changes. 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 No evidence of a new acute territorial infarction. No hydrocephalus. ORBITS: Post right native opacular lens replacement. SINUSES AND MASTOIDS: No acute abnormality. SOFT TISSUES AND SKULL: No  acute skull fracture. No acute soft tissue abnormality. IMPRESSION: 1. No acute intracranial hemorrhage or evidence of a new acute territorial infarction. 2. 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 Electronically signed by: prentice bybordi 09/09/2024 04:05 PM EST RP Workstation: [id number]   ECHOCARDIOGRAM COMPLETE Result Date: 09/09/2024    ECHOCARDIOGRAM REPORT   Patient Name:   Tyrone Newton Date of Exam: 09/09/2024 Medical Rec #:  [id number]        Height:       75.0 in Accession #:    [id number]       Weight:       175.0 lb Date of Birth:  [birth year]        BSA:          2.073 m Patient Age:    78 years         BP:           128/78 mmHg Patient Gender: M                HR:           72 bpm. Exam Location:  Inpatient Procedure: 2D Echo (Both Spectral and Color Flow Doppler were utilized during            procedure). Indications:    Stroke  History:        Patient has no prior history of Echocardiogram examinations.                 Arrythmias:Atrial Fibrillation.  Sonographer:    Charmaine Gaskins Referring Phys: [id number] JINDONG XU IMPRESSIONS  1. Left ventricular ejection fraction, by estimation, is 60 to 65%. The left ventricle has normal function. The left ventricle has no regional wall motion abnormalities. Left ventricular diastolic parameters are indeterminate.  2. Right ventricular systolic function is normal. The right ventricular size is normal. There is normal pulmonary artery systolic pressure.  3. The mitral valve is normal in structure. Moderate mitral valve regurgitation. No evidence of mitral stenosis.  4. Tricuspid valve regurgitation is mild to moderate.  5. The aortic valve is tricuspid. There is mild calcification of the aortic valve. Aortic valve regurgitation is mild to moderate. Aortic valve sclerosis is present, with no evidence of aortic valve stenosis.  6. Aortic dilatation noted. Aneurysm of the ascending aorta, measuring 45 mm.  7. The inferior vena cava is normal in size with greater than 50% respiratory variability, suggesting right atrial pressure of 3 mmHg. Conclusion(s)/Recommendation(s): No intracardiac source of embolism detected on this transthoracic  study. Consider a transesophageal echocardiogram to exclude cardiac source of embolism if clinically indicated. FINDINGS  Left Ventricle: Left ventricular ejection fraction, by estimation, is 60 to 65%. The left ventricle has normal function. The left ventricle has no regional wall motion abnormalities. The left ventricular internal cavity size was normal in size. There is  no left ventricular hypertrophy. Left ventricular diastolic parameters are indeterminate. Right Ventricle: The right ventricular size is normal. No increase in right ventricular wall thickness. Right ventricular systolic function is normal. There is normal pulmonary artery systolic pressure. The tricuspid regurgitant velocity is 2.56 m/s, and  with an assumed right atrial pressure of 3 mmHg, the estimated right ventricular systolic pressure is 29.2 mmHg. Left Atrium: Left atrial size was normal in size. Right Atrium: Right atrial size was normal in size. Pericardium: There is no evidence of pericardial effusion. Mitral Valve:  The mitral valve is normal in structure. Moderate mitral valve regurgitation. No evidence of mitral valve stenosis. Tricuspid Valve: The tricuspid valve is normal in structure. Tricuspid valve regurgitation is mild to moderate. No evidence of tricuspid stenosis. Aortic Valve: The aortic valve is tricuspid. There is mild calcification of the aortic valve. Aortic valve regurgitation is mild to moderate. Aortic valve sclerosis is present, with no evidence of aortic valve stenosis. Pulmonic Valve: The pulmonic valve was normal in structure. Pulmonic valve regurgitation is mild. No evidence of pulmonic stenosis. Aorta: Aortic dilatation noted. There is an aneurysm involving the ascending aorta measuring 45 mm. Venous: The inferior vena cava is normal in size with greater than 50% respiratory variability, suggesting right atrial pressure of 3 mmHg. IAS/Shunts: No atrial level shunt detected by color flow Doppler.  LEFT VENTRICLE  PLAX 2D LVIDd:         4.20 cm     Diastology LVIDs:         3.00 cm     LV e' medial:    12.70 cm/s LV PW:         1.00 cm     LV E/e' medial:  7.8 LV IVS:        1.00 cm     LV e' lateral:   15.40 cm/s LVOT diam:     2.20 cm     LV E/e' lateral: 6.5 LVOT Area:     3.80 cm  LV Volumes (MOD) LV vol d, MOD A2C: 70.7 ml LV vol d, MOD A4C: 86.5 ml LV vol s, MOD A2C: 27.7 ml LV vol s, MOD A4C: 29.1 ml LV SV MOD A2C:     43.0 ml LV SV MOD A4C:     86.5 ml LV SV MOD BP:      50.5 ml RIGHT VENTRICLE RV Basal diam:  3.70 cm RV Mid diam:    4.20 cm RV S prime:     17.70 cm/s LEFT ATRIUM              Index        RIGHT ATRIUM           Index LA diam:        3.40 cm  1.64 cm/m   RA Area:     26.80 cm LA Vol (A2C):   111.0 ml 53.54 ml/m  RA Volume:   79.00 ml  38.10 ml/m LA Vol (A4C):   104.0 ml 50.16 ml/m LA Biplane Vol: 108.0 ml 52.09 ml/m   AORTA Ao Root diam: 3.70 cm Ao Asc diam:  4.62 cm MITRAL VALVE               TRICUSPID VALVE MV Area (PHT): 4.38 cm    TR Peak grad:   26.2 mmHg MV Decel Time: 173 msec    TR Vmax:        256.00 cm/s MV E velocity: 99.50 cm/s                            SHUNTS                            Systemic Diam: 2.20 cm Oneil Parchment MD Electronically signed by Oneil Parchment MD Signature Date/Time: 09/09/2024/3:16:51 PM    Final    MR BRAIN WO CONTRAST Result Date: 09/08/2024 EXAM: MRI Brain Without Contrast 09/08/2024 06:48:39 PM TECHNIQUE: Multiplanar  multisequence MRI of the head/brain was performed without the administration of intravenous contrast. COMPARISON: CT head earlier today. 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 Edema without mass effect. No intracranial hemorrhage. No mass. No midline shift. No hydrocephalus. The sella is unremarkable. Normal flow voids. ORBITS: No acute abnormality. SINUSES AND MASTOIDS: No acute abnormality. BONES AND SOFT TISSUES: Normal marrow signal. No acute  soft tissue abnormality. IMPRESSION: 1. 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 2. Additional suspected punctate acute infarcts the left frontal and right occipital lobes. Electronically signed by: Gilmore Molt MD 09/08/2024 07:57 PM EST RP Workstation: [id number]   CT Head Wo Contrast Result Date: 09/08/2024 EXAM: CT HEAD WITHOUT 09/08/2024 01:50:57 PM TECHNIQUE: CT of the head was performed without the administration of intravenous contrast. Automated exposure control, iterative reconstruction, and/or weight based adjustment of the mA/kV was utilized to reduce the radiation dose to as low as reasonably achievable. COMPARISON: None available. CLINICAL HISTORY: Transient ischemic attack (TIA) FINDINGS: BRAIN AND VENTRICLES: No acute intracranial hemorrhage. No mass effect or midline shift. No extra-axial fluid collection. No evidence of acute infarct. No hydrocephalus. Chronic ischemic white matter changes. Atherosclerotic calcifications in cavernous internal carotid arteries. ORBITS: Right lens replacement. SINUSES AND MASTOIDS: No acute abnormality. SOFT TISSUES AND SKULL: No acute skull fracture. No acute soft tissue abnormality. IMPRESSION: 1. No acute intracranial abnormality. 2. Chronic ischemic white matter changes. Electronically signed by: Franky Stanford MD 09/08/2024 02:59 PM EST RP Workstation: [id number]   DG Chest Portable 1 View Result Date: 09/08/2024 CLINICAL DATA:  Cough. EXAM: PORTABLE CHEST 1 VIEW COMPARISON:  10/03/2023. FINDINGS: The heart size and mediastinal contours are unchanged. Known bilateral calcified pleural plaques. No appreciable acute airspace consolidation. No pleural effusion or pneumothorax. No acute osseous abnormality. IMPRESSION: 1. No acute cardiopulmonary findings. 2. Known calcified bilateral pleural plaques, compatible with asbestos related pleural disease. Electronically Signed   By: Harrietta Sherry M.D.   On: 09/08/2024 13:18     Anti-infectives: Anti-infectives (From admission, onward)    Start     Dose/Rate Route Frequency Ordered Stop   09/10/24 0800  Ampicillin-Sulbactam (UNASYN) 3 g in sodium chloride  0.9 % 100 mL IVPB        3 g 200 mL/hr over 30 Minutes Intravenous Every 6 hours 09/10/24 0648     09/09/24 1200  cefTRIAXone  (ROCEPHIN ) 1 g in sodium chloride  0.9 % 100 mL IVPB  Status:  Discontinued        1 g 200 mL/hr over 30 Minutes Intravenous Every 24 hours 09/09/24 1133 09/10/24 0644        Assessment/Plan  Chest wall hematoma without evidence of infection I do not see an acute role for aspiration or incision/drainage in the absence of obvious infection. Family agrees. I did see that he is febrile but I think that this hematoma is very unlikely the source of the fever, more likely his PNA or UTI.   General surgery will sign of, please call as needed.    LOS: 2 days   I reviewed nursing notes, hospitalist notes, last 24 h vitals and pain scores, last 48 h intake and output, last 24 h labs and trends, and last 24 h imaging results.  This care required moderate level of medical decision making.   Almarie Pringle, PA-C [facility] Please see Amion for pager number during day hours 7:00am-4:30pm

## 2024-09-11 ENCOUNTER — Inpatient Hospital Stay (HOSPITAL_COMMUNITY)

## 2024-09-11 DIAGNOSIS — E43 Unspecified severe protein-calorie malnutrition: Secondary | ICD-10-CM | POA: Insufficient documentation

## 2024-09-11 LAB — PHOSPHORUS: Phosphorus: 3.1 mg/dL (ref 2.5–4.6)

## 2024-09-11 LAB — CBC
HCT: 35.6 % — ABNORMAL LOW (ref 39.0–52.0)
Hemoglobin: 11.7 g/dL — ABNORMAL LOW (ref 13.0–17.0)
MCH: 29.3 pg (ref 26.0–34.0)
MCHC: 32.9 g/dL (ref 30.0–36.0)
MCV: 89.2 fL (ref 80.0–100.0)
Platelets: 125 K/uL — ABNORMAL LOW (ref 150–400)
RBC: 3.99 MIL/uL — ABNORMAL LOW (ref 4.22–5.81)
RDW: 15 % (ref 11.5–15.5)
WBC: 8.9 K/uL (ref 4.0–10.5)
nRBC: 0 % (ref 0.0–0.2)

## 2024-09-11 LAB — COMPREHENSIVE METABOLIC PANEL WITH GFR
ALT: 18 U/L (ref 0–44)
AST: 15 U/L (ref 15–41)
Albumin: 2.9 g/dL — ABNORMAL LOW (ref 3.5–5.0)
Alkaline Phosphatase: 41 U/L (ref 38–126)
Anion gap: 18 — ABNORMAL HIGH (ref 5–15)
BUN: 34 mg/dL — ABNORMAL HIGH (ref 8–23)
CO2: 18 mmol/L — ABNORMAL LOW (ref 22–32)
Calcium: 8 mg/dL — ABNORMAL LOW (ref 8.9–10.3)
Chloride: 109 mmol/L (ref 98–111)
Creatinine, Ser: 1.77 mg/dL — ABNORMAL HIGH (ref 0.61–1.24)
GFR, Estimated: 38 mL/min — ABNORMAL LOW (ref >60)
Glucose, Bld: 212 mg/dL — ABNORMAL HIGH (ref 70–99)
Potassium: 3.3 mmol/L — ABNORMAL LOW (ref 3.5–5.1)
Sodium: 145 mmol/L (ref 135–145)
Total Bilirubin: 1.3 mg/dL — ABNORMAL HIGH (ref 0.0–1.2)
Total Protein: 5.2 g/dL — ABNORMAL LOW (ref 6.5–8.1)

## 2024-09-11 LAB — GLUCOSE, CAPILLARY
Glucose-Capillary: 197 mg/dL — ABNORMAL HIGH (ref 70–99)
Glucose-Capillary: 200 mg/dL — ABNORMAL HIGH (ref 70–99)
Glucose-Capillary: 214 mg/dL — ABNORMAL HIGH (ref 70–99)
Glucose-Capillary: 262 mg/dL — ABNORMAL HIGH (ref 70–99)
Glucose-Capillary: 265 mg/dL — ABNORMAL HIGH (ref 70–99)

## 2024-09-11 LAB — MAGNESIUM: Magnesium: 2 mg/dL (ref 1.7–2.4)

## 2024-09-11 MED ORDER — PANTOPRAZOLE SODIUM 40 MG IV SOLR
40.0000 mg | INTRAVENOUS | Status: DC
  Administered 2024-09-11 – 2024-09-12 (×2): 40 mg via INTRAVENOUS
  Filled 2024-09-11 (×2): qty 10

## 2024-09-11 MED ORDER — POLYETHYLENE GLYCOL 3350 17 G PO PACK: 17.0000 g | PACK | Freq: Every day | ORAL | Status: DC | PRN

## 2024-09-11 MED ORDER — PROSOURCE TF20 ENFIT COMPATIBL EN LIQD
60.0000 mL | Freq: Every day | ENTERAL | Status: DC
  Administered 2024-09-11 – 2024-09-12 (×2): 60 mL
  Filled 2024-09-11 (×2): qty 60

## 2024-09-11 MED ORDER — QUETIAPINE FUMARATE 50 MG PO TABS
50.0000 mg | ORAL_TABLET | Freq: Every day | ORAL | Status: DC
  Administered 2024-09-11 – 2024-09-12 (×2): 50 mg
  Filled 2024-09-11 (×2): qty 1

## 2024-09-11 MED ORDER — APIXABAN 5 MG PO TABS
5.0000 mg | ORAL_TABLET | Freq: Two times a day (BID) | ORAL | Status: DC
  Administered 2024-09-11 – 2024-09-12 (×3): 5 mg
  Filled 2024-09-11 (×3): qty 1

## 2024-09-11 MED ORDER — THIAMINE MONONITRATE 100 MG PO TABS
100.0000 mg | ORAL_TABLET | Freq: Every day | ORAL | Status: DC
  Administered 2024-09-11 – 2024-09-12 (×2): 100 mg
  Filled 2024-09-11 (×2): qty 1

## 2024-09-11 MED ORDER — PYRIDOSTIGMINE BROMIDE 60 MG/5ML PO SOLN
20.0000 mg | Freq: Three times a day (TID) | ORAL | Status: DC
  Administered 2024-09-11 – 2024-09-12 (×5): 20.4 mg
  Filled 2024-09-11 (×7): qty 1.7

## 2024-09-11 MED ORDER — PREDNISONE 5 MG PO TABS
25.0000 mg | ORAL_TABLET | ORAL | Status: DC
  Administered 2024-09-12: 25 mg
  Filled 2024-09-11: qty 1

## 2024-09-11 MED ORDER — QUETIAPINE FUMARATE 50 MG PO TABS: 50.0000 mg | ORAL_TABLET | Freq: Every day | ORAL | Status: DC

## 2024-09-11 MED ORDER — QUETIAPINE FUMARATE 25 MG PO TABS: 25.0000 mg | ORAL_TABLET | Freq: Every morning | ORAL | Status: DC

## 2024-09-11 MED ORDER — PREDNISONE 5 MG PO TABS: 65.0000 mg | ORAL_TABLET | ORAL | Status: DC

## 2024-09-11 MED ORDER — FREE WATER
150.0000 mL | Freq: Three times a day (TID) | Status: DC
  Administered 2024-09-11 (×2): 150 mL

## 2024-09-11 MED ORDER — OSMOLITE 1.5 CAL PO LIQD
1000.0000 mL | ORAL | Status: DC
  Administered 2024-09-11: 1000 mL
  Filled 2024-09-11 (×3): qty 1000

## 2024-09-11 NOTE — Procedures (Signed)
 Cortrak  Tube Type:  Cortrak - 43 inches Tube Location:  Left nare Secured by: Bridle Initial Placement:  Gastric Technique Used to Measure Tube Placement:  Marking at nare/corner of mouth Cortrak Secured At:  80 cm   Cortrak Tube Team Note:  Consult received to place a Cortrak feeding tube.   No x-ray is required. RN may begin using tube.   If the tube becomes dislodged please keep the tube and contact the Cortrak team at [email or web address] for replacement.  If after hours and replacement cannot be delayed, place a NG tube and confirm placement with an abdominal x-ray.    Augustin Shams MS, RD, LDN If unable to be reached, please send secure chat to RD inpatient available from 8:00a-4:00p daily

## 2024-09-11 NOTE — Progress Notes (Signed)
 PROGRESS NOTE        PATIENT DETAILS Name: Tyrone Newton Age: 78 y.o. Sex: male Date of Birth: 1946/08/31 Admit Date: 09/08/2024 Admitting Physician Marsa KATHEE Scurry, MD ERE:Tnnid, Jayson, MD  Brief Summary: Patient is a 78 y.o.  male with history of myasthenia gravis, CVA, PAF, CAD, prostate cancer-s/p prostatectomy-presented with difficulty ambulating, slurred speech-upon further eval-found to have acute CVA.  Further hospital course complicated by fever secondary to probable complicated UTI/aspiration pneumonia, and delirium.  Significant events: 12/9>> admit to [facility] 12/11>> confused overnight-febrile-repeat chest x-ray with possible left-sided consolidation-on IV Rocephin -Flagyl added 12/12>> failed MBS-after discussion with family-Cortrak tube inserted  Significant studies: 12/9>> MRI brain: Acute infarcts in the cerebellum bilaterally.  Suspected acute punctate infarct in the left frontal/right frontal occipital lobes. 12/10>> CT head: No acute intracranial hemorrhage 12/10>> echo: EF 60-65% moderate MR, mild to moderate AR 12/10>> LDL: 31 12/11>> CXR: Interval progression of retrocardiac collapse/consolidation with small left pleural effusion 12/11>> A1c: 10.8  Significant microbiology data: 12/9>> urine culture: E. coli 12/9>> blood culture: No growth 12/11>> blood culture: No growth  Procedures: None  Consults: Neurology  Subjective: Less agitated-a bit more awake but still very restless.  After numerous attempts-Will answer some questions appropriately-has dysarthric speech.  Objective: Vitals: Blood pressure (!) 151/68, pulse 87, temperature 98.1 F (36.7 C), temperature source Oral, resp. rate 19, height 6' 3 (1.905 m), weight 79.4 kg, SpO2 96%.   Exam: Relatively more awake and alert today-speech is slurred Chest: Clear to auscultation Abdomen: Soft nontender nondistended Extremities: No edema Difficult neuroexam but seems  to be moving all 4 extremities-but has diffuse weakness.  Pertinent Labs/Radiology:    Latest Ref Rng & Units 09/11/2024    3:40 AM 09/10/2024    2:44 AM 09/09/2024    3:48 AM  CBC  WBC 4.0 - 10.5 K/uL 8.9  8.8  6.8   Hemoglobin 13.0 - 17.0 g/dL 88.2  87.1  87.9   Hematocrit 39.0 - 52.0 % 35.6  39.6  39.7   Platelets 150 - 400 K/uL 125  130  117     Lab Results  Component Value Date   NA 145 09/11/2024   K 3.3 (L) 09/11/2024   CL 109 09/11/2024   CO2 18 (L) 09/11/2024      Assessment/Plan: Acute CVA Likely embolic-suspicion for noncompliance with Eliquis  (given weight loss-concern for some occult malignancy) Although improved-still remains restless/agitated-continue to hold off on CTA head/neck unless his delirium improves a bit more.   Unfortunately appears to have dysarthria-and oropharyngeal dysphagia-likely sequelae of his acute CVA-did not do well with his modified barium swallow-subsequently after extensive discussion with spouse/son at bedside- Cortrak tube inserted. Continue Eliquis  Stroke team following.  Acute metabolic encephalopathy Probably secondary to complicated UTI/aspiration pneumonia Although improved-still restless/agitated-now Cortrak inserted early this morning-and in restraints (attempting to pull it out) Continue Seroquel Continue to treat underlying etiologies with antibiotics Check EEG but doubt seizures. Recent TSH/ammonia stable.  AKI/metabolic acidosis Likely hemodynamically mediated-secondary to poor oral intake/fe fever Continue IV bicarb-reassess tomorrow. Await renal ultrasound  Hypokalemia Replete/recheck  Aspiration pneumonia Afebrile No leukocytosis Rocephin /Flagyl Failed swallow evaluation-continue n.p.o. status.   Complicated UTI-E. coli Continue IV Rocephin   History of prostate cancer-s/p prostatectomy, s/p prostatic urethral sphincter  Right chest wall mass Felt to be a firm hematoma-has had prior imaging studies  before this  hospitalization-per spouse-completed a course of doxycycline. General surgery consulted 12/10-family refused open evacuation-supportive care for now.  PAF Telemetry monitoring Eliquis   CAD Eliquis  Holding Lipitor-given history of myasthenia gravis  Prior history of CVA See above  DM 2 CBG stable SSI  Recent Labs    09/10/24 1726 09/10/24 2116 09/11/24 0818  GLUCAP 156* 129* 265*     History of myasthenia gravis Appears stable Given his restlessness/agitation-monitoring neph/FCD will be almost impossible Continue pyridostigmine /prednisone -follow clinically.  History of prostate cancer Device in place for urethral sphincter to urinate Supportive care  Anterior neck swelling Firm right sided neck mass-very hard to discern on exam-given agitation/delirium See below regarding plans for pan CT.  Weight loss Significant weight loss of almost 100 pounds in the past 1 year TSH stable Given recurrent embolic CVA-concern for hypercoagulable state from occult malignancy Once renal function/mentation permits-Will plan an CT.  Palliative care Patient has had significant decline in function for almost a year-this is his second CVA-although slightly improved than yesterday-he still encephalopathic-along with CVA-has aspiration pneumonia and a complicated UTI.  Unfortunately-he appears to have severe oropharyngeal dysphagia-and is not safe for oral intake per SLP, this MD had a long discussion with the patient's spouse and stepson at bedside about different options including initiation of hospice care, or temporarily placing a NG tube for nutrition/hydration.  Family elected to proceed with a cortrak tube-however sounds like that the patient did not want more permanent tube options (per my discussion with family)-given his long-term decline for the past 1 year-and now with severe and other issues noted above-I think it is reasonable to begin goals of care discussion with  family.  Will consult palliative care  Code status:   Code Status: Full Code   DVT Prophylaxis: apixaban  (ELIQUIS ) tablet 5 mg    Family Communication: Spouse and step son at bedside 12/12  Disposition Plan: Status is: Inpatient Remains inpatient appropriate because: Severity of illness   Planned Discharge Destination:Skilled nursing facility   Diet: Diet Order             Diet NPO time specified Except for: Ice Chips, Sips with Meds  Diet effective now                     Antimicrobial agents: Anti-infectives (From admission, onward)    Start     Dose/Rate Route Frequency Ordered Stop   09/10/24 1215  cefTRIAXone  (ROCEPHIN ) 2 g in sodium chloride  0.9 % 100 mL IVPB        2 g 200 mL/hr over 30 Minutes Intravenous Every 24 hours 09/10/24 1120     09/10/24 1215  metroNIDAZOLE (FLAGYL) IVPB 500 mg        500 mg 100 mL/hr over 60 Minutes Intravenous Every 12 hours 09/10/24 1120     09/10/24 0800  Ampicillin-Sulbactam (UNASYN) 3 g in sodium chloride  0.9 % 100 mL IVPB  Status:  Discontinued        3 g 200 mL/hr over 30 Minutes Intravenous Every 6 hours 09/10/24 0648 09/10/24 1120   09/09/24 1200  cefTRIAXone  (ROCEPHIN ) 1 g in sodium chloride  0.9 % 100 mL IVPB  Status:  Discontinued        1 g 200 mL/hr over 30 Minutes Intravenous Every 24 hours 09/09/24 1133 09/10/24 0644        MEDICATIONS: Scheduled Meds:  apixaban   5 mg Per Tube BID   feeding supplement (PROSource TF20)  60 mL Per Tube Daily  free water  150 mL Per Tube Q8H   insulin  aspart  0-9 Units Subcutaneous TID WC   pantoprazole  (PROTONIX ) IV  40 mg Intravenous Q24H   [START ON 09/12/2024] predniSONE   25 mg Per Tube Once per day on Sunday Monday Tuesday Wednesday Thursday Saturday   [START ON 09/18/2024] predniSONE   65 mg Per Tube Q Fri   pyridostigmine   20.4 mg Per Tube Q8H   QUEtiapine  50 mg Per Tube QHS   sodium chloride  flush  3 mL Intravenous Q12H   thiamine  100 mg Per Tube Daily    Continuous Infusions:  cefTRIAXone  (ROCEPHIN )  IV Stopped (09/10/24 1513)   feeding supplement (OSMOLITE 1.5 CAL)     metronidazole Stopped (09/11/24 0701)   sodium bicarbonate 150 mEq in dextrose 5 % 1,150 mL infusion 100 mL/hr at 09/11/24 0900   PRN Meds:.acetaminophen  **OR** acetaminophen , albuterol , artificial tears, haloperidol  lactate, iohexol, polyethylene glycol   I have personally reviewed following labs and imaging studies  LABORATORY DATA: CBC: Recent Labs  Lab 09/08/24 1117 09/08/24 1418 09/09/24 0348 09/10/24 0244 09/11/24 0340  WBC 7.2  --  6.8 8.8 8.9  NEUTROABS 6.2  --   --   --   --   HGB 12.2* 11.6* 12.0* 12.8* 11.7*  HCT 38.2* 34.0* 39.7 39.6 35.6*  MCV 90.7  --  97.3 91.0 89.2  PLT 133*  --  117* 130* 125*    Basic Metabolic Panel: Recent Labs  Lab 09/08/24 1117 09/08/24 1418 09/09/24 0348 09/10/24 0244 09/11/24 0340  NA 140 140 143 143 145  K 3.7 3.7 3.3* 3.5 3.3*  CL 104  --  116* 112* 109  CO2 25  --  14* 13* 18*  GLUCOSE 149*  --  80 142* 212*  BUN 24*  --  24* 27* 34*  CREATININE 1.24  --  1.20 1.49* 1.77*  CALCIUM 9.2  --  7.3* 8.6* 8.0*    GFR: Estimated Creatinine Clearance: 37.4 mL/min (A) (by C-G formula based on SCr of 1.77 mg/dL (H)).  Liver Function Tests: Recent Labs  Lab 09/08/24 1117 09/09/24 0348 09/10/24 0244 09/11/24 0340  AST 16 19 15 15   ALT 21 15 20 18   ALKPHOS 39 34* 43 41  BILITOT 2.5* 1.9* 2.5* 1.3*  PROT 5.4* 4.3* 5.3* 5.2*  ALBUMIN 3.5 2.6* 3.3* 2.9*   No results for input(s): LIPASE, AMYLASE in the last 168 hours. Recent Labs  Lab 09/08/24 1411  AMMONIA 25    Coagulation Profile: No results for input(s): INR, PROTIME in the last 168 hours.  Cardiac Enzymes: No results for input(s): CKTOTAL, CKMB, CKMBINDEX, TROPONINI in the last 168 hours.  BNP (last 3 results) No results for input(s): PROBNP in the last 8760 hours.  Lipid Profile: Recent Labs    09/09/24 1557  CHOL  82  HDL 34*  LDLCALC 31  TRIG 83  CHOLHDL 2.4    Thyroid Function Tests: No results for input(s): TSH, T4TOTAL, FREET4, T3FREE, THYROIDAB in the last 72 hours.   Anemia Panel: No results for input(s): VITAMINB12, FOLATE, FERRITIN, TIBC, IRON, RETICCTPCT in the last 72 hours.  Urine analysis:    Component Value Date/Time   COLORURINE AMBER (A) 09/08/2024 1500   APPEARANCEUR CLOUDY (A) 09/08/2024 1500   LABSPEC 1.023 09/08/2024 1500   PHURINE 5.0 09/08/2024 1500   GLUCOSEU >=500 (A) 09/08/2024 1500   HGBUR LARGE (A) 09/08/2024 1500   BILIRUBINUR NEGATIVE 09/08/2024 1500   KETONESUR 20 (A) 09/08/2024 1500  PROTEINUR NEGATIVE 09/08/2024 1500   UROBILINOGEN 1.0 07/28/2013 2109   NITRITE POSITIVE (A) 09/08/2024 1500   LEUKOCYTESUR SMALL (A) 09/08/2024 1500    Sepsis Labs: Lactic Acid, Venous    Component Value Date/Time   LATICACIDVEN 1.0 09/10/2024 0727    MICROBIOLOGY: Recent Results (from the past 240 hours)  Urine Culture     Status: Abnormal   Collection Time: 09/08/24  3:00 PM   Specimen: Urine, Random  Result Value Ref Range Status   Specimen Description URINE, RANDOM  Final   Special Requests   Final    NONE Reflexed from [id number] Performed at [facility], [address], [place], [place] [address]    Culture >=100,000 COLONIES/mL ESCHERICHIA COLI (A)  Final   Report Status 09/10/2024 FINAL  Final   Organism ID, Bacteria ESCHERICHIA COLI (A)  Final      Susceptibility   Escherichia coli - MIC*    AMPICILLIN >=32 RESISTANT Resistant     CEFAZOLIN (URINE) Value in next row Sensitive      8 SENSITIVEThis is a modified FDA-approved test that has been validated and its performance characteristics determined by the reporting laboratory.  This laboratory is certified under the Clinical Laboratory Improvement Amendments CLIA as qualified to perform high complexity clinical laboratory testing.    CEFEPIME Value in next row Sensitive       8 SENSITIVEThis is a modified FDA-approved test that has been validated and its performance characteristics determined by the reporting laboratory.  This laboratory is certified under the Clinical Laboratory Improvement Amendments CLIA as qualified to perform high complexity clinical laboratory testing.    ERTAPENEM Value in next row Sensitive      8 SENSITIVEThis is a modified FDA-approved test that has been validated and its performance characteristics determined by the reporting laboratory.  This laboratory is certified under the Clinical Laboratory Improvement Amendments CLIA as qualified to perform high complexity clinical laboratory testing.    CEFTRIAXONE  Value in next row Sensitive      8 SENSITIVEThis is a modified FDA-approved test that has been validated and its performance characteristics determined by the reporting laboratory.  This laboratory is certified under the Clinical Laboratory Improvement Amendments CLIA as qualified to perform high complexity clinical laboratory testing.    CIPROFLOXACIN  Value in next row Resistant      8 SENSITIVEThis is a modified FDA-approved test that has been validated and its performance characteristics determined by the reporting laboratory.  This laboratory is certified under the Clinical Laboratory Improvement Amendments CLIA as qualified to perform high complexity clinical laboratory testing.    GENTAMICIN Value in next row Resistant      8 SENSITIVEThis is a modified FDA-approved test that has been validated and its performance characteristics determined by the reporting laboratory.  This laboratory is certified under the Clinical Laboratory Improvement Amendments CLIA as qualified to perform high complexity clinical laboratory testing.    NITROFURANTOIN Value in next row Intermediate      8 SENSITIVEThis is a modified FDA-approved test that has been validated and its performance characteristics determined by the reporting laboratory.  This laboratory is  certified under the Clinical Laboratory Improvement Amendments CLIA as qualified to perform high complexity clinical laboratory testing.    TRIMETH/SULFA Value in next row Resistant      8 SENSITIVEThis is a modified FDA-approved test that has been validated and its performance characteristics determined by the reporting laboratory.  This laboratory is certified under the Clinical Laboratory Improvement Amendments  CLIA as qualified to perform high complexity clinical laboratory testing.    AMPICILLIN/SULBACTAM Value in next row Intermediate      8 SENSITIVEThis is a modified FDA-approved test that has been validated and its performance characteristics determined by the reporting laboratory.  This laboratory is certified under the Clinical Laboratory Improvement Amendments CLIA as qualified to perform high complexity clinical laboratory testing.    PIP/TAZO Value in next row Sensitive      <=4 SENSITIVEThis is a modified FDA-approved test that has been validated and its performance characteristics determined by the reporting laboratory.  This laboratory is certified under the Clinical Laboratory Improvement Amendments CLIA as qualified to perform high complexity clinical laboratory testing.    MEROPENEM Value in next row Sensitive      <=4 SENSITIVEThis is a modified FDA-approved test that has been validated and its performance characteristics determined by the reporting laboratory.  This laboratory is certified under the Clinical Laboratory Improvement Amendments CLIA as qualified to perform high complexity clinical laboratory testing.    * >=100,000 COLONIES/mL ESCHERICHIA COLI  Blood culture (routine x 2)     Status: None (Preliminary result)   Collection Time: 09/08/24  3:03 PM   Specimen: BLOOD  Result Value Ref Range Status   Specimen Description BLOOD SITE NOT SPECIFIED  Final   Special Requests   Final    BOTTLES DRAWN AEROBIC AND ANAEROBIC Blood Culture adequate volume   Culture   Final     NO GROWTH 3 DAYS Performed at [facility], [address] [place], [place] [address]    Report Status PENDING  Incomplete  Blood culture (routine x 2)     Status: None (Preliminary result)   Collection Time: 09/08/24  3:21 PM   Specimen: BLOOD  Result Value Ref Range Status   Specimen Description BLOOD SITE NOT SPECIFIED  Final   Special Requests   Final    BOTTLES DRAWN AEROBIC ONLY Blood Culture results may not be optimal due to an inadequate volume of blood received in culture bottles   Culture   Final    NO GROWTH 3 DAYS Performed at [facility], [address], [place], [place] [address]    Report Status PENDING  Incomplete  Culture, blood (Routine X 2) w Reflex to ID Panel     Status: None (Preliminary result)   Collection Time: 09/10/24  3:44 AM   Specimen: BLOOD RIGHT ARM  Result Value Ref Range Status   Specimen Description BLOOD RIGHT ARM  Final   Special Requests   Final    BOTTLES DRAWN AEROBIC AND ANAEROBIC Blood Culture adequate volume   Culture   Final    NO GROWTH 1 DAY Performed at [facility], [address], [place], [place] [address]    Report Status PENDING  Incomplete  Culture, blood (Routine X 2) w Reflex to ID Panel     Status: None (Preliminary result)   Collection Time: 09/10/24  3:54 AM   Specimen: BLOOD RIGHT ARM  Result Value Ref Range Status   Specimen Description BLOOD RIGHT ARM  Final   Special Requests   Final    BOTTLES DRAWN AEROBIC AND ANAEROBIC Blood Culture adequate volume   Culture   Final    NO GROWTH 1 DAY Performed at [facility], [address] [place], [place] [address]    Report Status PENDING  Incomplete    RADIOLOGY STUDIES/RESULTS: EEG adult Result Date: 09/10/2024 [place],  Arlin KIDD, MD     09/10/2024  5:11 PM Patient Name: Tyrone Newton MRN: [id number] Epilepsy Attending: Arlin KIDD Krebs Referring Physician/Provider: Raenelle Donalda HERO, MD Date: 09/10/2024 Duration: 22.15 mins Patient  history: 78yo M with ams. EEG to evaluate for seizure Level of alertness: Awake AEDs during EEG study: None Technical aspects: This EEG study was done with scalp electrodes positioned according to the 10-20 International system of electrode placement. Electrical activity was reviewed with band pass filter of 1-70Hz , sensitivity of 7 uV/mm, display speed of 16mm/sec with a 60Hz  notched filter applied as appropriate. EEG data were recorded continuously and digitally stored.  Video monitoring was available and reviewed as appropriate. Description: EEG showed continuous generalized predominantly 5 to 6 Hz theta slowing admixed with intermittent 2-3hz  delta slowing. Hyperventilation and photic stimulation were not performed.   ABNORMALITY - Continuous slow, generalized IMPRESSION: This study is suggestive of generalized cerebral dysfunction ( encephalopathy). No seizures or epileptiform discharges were seen throughout the recording. Arlin KIDD Krebs   DG Chest Port 1 View Result Date: 09/10/2024 CLINICAL DATA:  Fever. EXAM: PORTABLE CHEST 1 VIEW COMPARISON:  09/08/2024 FINDINGS: Heart size is upper normal to mildly enlarged. Interval progression of retrocardiac collapse/consolidation with small left effusion. Right lung remains clear. Calcified pleural plaques again noted. Telemetry leads overlie the chest. IMPRESSION: Interval progression of retrocardiac collapse/consolidation with small left effusion. Electronically Signed   By: Camellia Candle M.D.   On: 09/10/2024 05:07   CT HEAD WO CONTRAST ( ) Result Date: 09/09/2024 EXAM: CT HEAD WITHOUT 09/09/2024 03:21:54 PM TECHNIQUE: CT of the head was performed without the administration of intravenous contrast. Automated exposure control, iterative reconstruction, and/or weight based adjustment of the mA/kV was utilized to reduce the radiation dose to as low as reasonably achievable. COMPARISON: MRI brain 09/08/2024. CT head dated 09/08/2024. CLINICAL HISTORY:  Stroke/TIA, determine embolic source. FINDINGS: BRAIN AND VENTRICLES: No acute intracranial hemorrhage. No mass effect or midline shift. No extra-axial fluid collection. There is overall similar mild scattered white matter hypodensities, which are nonspecific but most commonly represent chronic microvascular ischemic changes. 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 No evidence of a new acute territorial infarction. No hydrocephalus. ORBITS: Post right native opacular lens replacement. SINUSES AND MASTOIDS: No acute abnormality. SOFT TISSUES AND SKULL: No acute skull fracture. No acute soft tissue abnormality. IMPRESSION: 1. No acute intracranial hemorrhage or evidence of a new acute territorial infarction. 2. Previously identified recent bilateral cerebellar infarctions are seen more clearly on recent MRI brain 09/08/2024, see separate report for further relevant evaluation. Electronically signed by: prentice spade 09/09/2024 04:05 PM EST RP Workstation: [id number]     LOS: 3 days   Donalda Raenelle, MD  [facility]    To contact the attending provider between 7A-7P or the covering provider during after hours 7P-7A, please log into the web site [email or web address] and access using universal [place] password for that web site. If you do not have the password, please call the hospital operator.  09/11/2024, 3:03 PM

## 2024-09-11 NOTE — Inpatient Diabetes Management (Signed)
 Inpatient Diabetes Program Recommendations  AACE/ADA: New Consensus Statement on Inpatient Glycemic Control (2015)  Target Ranges:  Prepandial:   less than 140 mg/dL      Peak postprandial:   less than 180 mg/dL (1-2 hours)      Critically ill patients:  140 - 180 mg/dL   Lab Results  Component Value Date   GLUCAP 265 (H) 09/11/2024   HGBA1C 10.8 (H) 09/10/2024    Review of Glycemic Control  Latest Reference Range & Units 09/10/24 08:15 09/10/24 11:58 09/10/24 17:26 09/10/24 21:16 09/11/24 08:18  Glucose-Capillary 70 - 99 mg/dL 815 (H) 822 (H) 843 (H) 129 (H) 265 (H)  (H): Data is abnormally high Diabetes history: Type 2 DM Outpatient Diabetes medications: Jardiance-metformin 12.01-999 QD Current orders for Inpatient glycemic control: Novolog  0-9  units TID Prednisone  65 mg QD  Inpatient Diabetes Program Recommendations:    Consider changing correction to Novolog  0-9 units Q4H if to remain NPO.  Thanks, Tinnie Minus, MSN, RNC-OB Diabetes Coordinator [phone] (8a-5p)

## 2024-09-11 NOTE — Progress Notes (Signed)
 Unable to perform VC and NIF due to patient being asleep. Just received medication for anxiety and combativeness

## 2024-09-11 NOTE — Plan of Care (Signed)

## 2024-09-11 NOTE — Progress Notes (Signed)
 OT Cancellation Note  Patient Details Name: Tyrone Newton MRN: [id number] DOB: 05-Jun-1946   Cancelled Treatment:    Reason Eval/Treat Not Completed: Medical issues which prohibited therapy.  Remains lethargic, continue efforts as appropriate.    Jaidan Stachnik D Shiquita Collignon 09/11/2024, 2:58 PM 09/11/2024  RP, OTR/L  Acute Rehabilitation Services  Office:  [phone]

## 2024-09-11 NOTE — Progress Notes (Signed)
 PT Cancellation Note  Patient Details Name: Tyrone Newton MRN: [id number] DOB: 01/24/1946   Cancelled Treatment:    Reason Eval/Treat Not Completed: (P) Medical issues which prohibited therapy (Pt continues to be lethargic. Will follow up as able.)   Darryle George 09/11/2024, 4:00 PM

## 2024-09-11 NOTE — Progress Notes (Signed)
 Speech Language Pathology  Patient Details Name: Tyrone Newton MRN: [id number] DOB: 08-May-1946 Today's Date: 09/11/2024 Time:  -      MBS performed see full report once completed . Recommend NPO. Discussed with MD and family who wishes to proceed with Cortrak. Pt may have ice chips after oral care.     [name]  09/11/2024, 12:07 PM

## 2024-09-11 NOTE — Plan of Care (Signed)
  Problem: Education: Goal: Ability to describe self-care measures that may prevent or decrease complications (Diabetes Survival Skills Education) will improve Outcome: Not Progressing   Problem: Coping: Goal: Ability to adjust to condition or change in health will improve Outcome: Not Progressing   Problem: Nutritional: Goal: Maintenance of adequate nutrition will improve Outcome: Not Progressing

## 2024-09-11 NOTE — Progress Notes (Addendum)
 STROKE TEAM PROGRESS NOTE   SUBJECTIVE (INTERVAL HISTORY) His wife and son are at the bedside. Patient is very sleepy. Arousable but only stays awake for short periods of time. EEG showed no seizure, + for encephalopathy.    OBJECTIVE Temp:  [97.7 F (36.5 C)-98.1 F (36.7 C)] 98.1 F (36.7 C) (12/12 0900) Pulse Rate:  [82-94] 87 (12/12 1017) Cardiac Rhythm: Atrial fibrillation (12/12 0900) Resp:  [14-22] 19 (12/12 1017) BP: (107-151)/(67-68) 151/68 (12/12 0900) SpO2:  [94 %-96 %] 96 % (12/12 1017)  Recent Labs  Lab 09/10/24 0815 09/10/24 1158 09/10/24 1726 09/10/24 2116 09/11/24 0818  GLUCAP 184* 177* 156* 129* 265*   Recent Labs  Lab 09/08/24 1117 09/08/24 1418 09/09/24 0348 09/10/24 0244 09/11/24 0340  NA 140 140 143 143 145  K 3.7 3.7 3.3* 3.5 3.3*  CL 104  --  116* 112* 109  CO2 25  --  14* 13* 18*  GLUCOSE 149*  --  80 142* 212*  BUN 24*  --  24* 27* 34*  CREATININE 1.24  --  1.20 1.49* 1.77*  CALCIUM 9.2  --  7.3* 8.6* 8.0*   Recent Labs  Lab 09/08/24 1117 09/09/24 0348 09/10/24 0244 09/11/24 0340  AST 16 19 15 15   ALT 21 15 20 18   ALKPHOS 39 34* 43 41  BILITOT 2.5* 1.9* 2.5* 1.3*  PROT 5.4* 4.3* 5.3* 5.2*  ALBUMIN 3.5 2.6* 3.3* 2.9*   Recent Labs  Lab 09/08/24 1117 09/08/24 1418 09/09/24 0348 09/10/24 0244 09/11/24 0340  WBC 7.2  --  6.8 8.8 8.9  NEUTROABS 6.2  --   --   --   --   HGB 12.2* 11.6* 12.0* 12.8* 11.7*  HCT 38.2* 34.0* 39.7 39.6 35.6*  MCV 90.7  --  97.3 91.0 89.2  PLT 133*  --  117* 130* 125*   No results for input(s): CKTOTAL, CKMB, CKMBINDEX, TROPONINI in the last 168 hours. No results for input(s): LABPROT, INR in the last 72 hours. Recent Labs    09/08/24 1500  COLORURINE AMBER*  LABSPEC 1.023  PHURINE 5.0  GLUCOSEU >=500*  HGBUR LARGE*  BILIRUBINUR NEGATIVE  KETONESUR 20*  PROTEINUR NEGATIVE  NITRITE POSITIVE*  LEUKOCYTESUR SMALL*       Component Value Date/Time   CHOL 82 09/09/2024 1557    TRIG 83 09/09/2024 1557   HDL 34 (L) 09/09/2024 1557   CHOLHDL 2.4 09/09/2024 1557   VLDL 17 09/09/2024 1557   LDLCALC 31 09/09/2024 1557   Lab Results  Component Value Date   HGBA1C 10.8 (H) 09/10/2024   No results found for: LABOPIA, COCAINSCRNUR, LABBENZ, AMPHETMU, THCU, LABBARB  Recent Labs  Lab 09/08/24 1117  ETH <15    I have personally reviewed the radiological images below and agree with the radiology interpretations.  EEG adult Result Date: 09/10/2024 Shelton Arlin KIDD, MD     09/10/2024  5:11 PM Patient Name: Tyrone Newton MRN: [id number] Epilepsy Attending: Arlin KIDD Shelton Referring Physician/Provider: Raenelle Donalda HERO, MD Date: 09/10/2024 Duration: 22.15 mins Patient history: 78yo M with ams. EEG to evaluate for seizure Level of alertness: Awake AEDs during EEG study: None Technical aspects: This EEG study was done with scalp electrodes positioned according to the 10-20 International system of electrode placement. Electrical activity was reviewed with band pass filter of 1-70Hz , sensitivity of 7 uV/mm, display speed of 69mm/sec with a 60Hz  notched filter applied as appropriate. EEG data were recorded continuously and digitally stored.  Video monitoring was  available and reviewed as appropriate. Description: EEG showed continuous generalized predominantly 5 to 6 Hz theta slowing admixed with intermittent 2-3hz  delta slowing. Hyperventilation and photic stimulation were not performed.   ABNORMALITY - Continuous slow, generalized IMPRESSION: This study is suggestive of generalized cerebral dysfunction ( encephalopathy). No seizures or epileptiform discharges were seen throughout the recording. Arlin MALVA Krebs   DG Chest Port 1 View Result Date: 09/10/2024 CLINICAL DATA:  Fever. EXAM: PORTABLE CHEST 1 VIEW COMPARISON:  09/08/2024 FINDINGS: Heart size is upper normal to mildly enlarged. Interval progression of retrocardiac collapse/consolidation with small left  effusion. Right lung remains clear. Calcified pleural plaques again noted. Telemetry leads overlie the chest. IMPRESSION: Interval progression of retrocardiac collapse/consolidation with small left effusion. Electronically Signed   By: Camellia Candle M.D.   On: 09/10/2024 05:07   CT HEAD WO CONTRAST ( ) Result Date: 09/09/2024 EXAM: CT HEAD WITHOUT 09/09/2024 03:21:54 PM TECHNIQUE: CT of the head was performed without the administration of intravenous contrast. Automated exposure control, iterative reconstruction, and/or weight based adjustment of the mA/kV was utilized to reduce the radiation dose to as low as reasonably achievable. COMPARISON: MRI brain 09/08/2024. CT head dated 09/08/2024. CLINICAL HISTORY: Stroke/TIA, determine embolic source. FINDINGS: BRAIN AND VENTRICLES: No acute intracranial hemorrhage. No mass effect or midline shift. No extra-axial fluid collection. There is overall similar mild scattered white matter hypodensities, which are nonspecific but most commonly represent chronic microvascular ischemic changes. 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 No evidence of a new acute territorial infarction. No hydrocephalus. ORBITS: Post right native opacular lens replacement. SINUSES AND MASTOIDS: No acute abnormality. SOFT TISSUES AND SKULL: No acute skull fracture. No acute soft tissue abnormality. IMPRESSION: 1. No acute intracranial hemorrhage or evidence of a new acute territorial infarction. 2. Previously identified recent bilateral cerebellar infarctions are seen more clearly on recent MRI brain 09/08/2024, see separate report for further relevant evaluation. Electronically signed by: prentice bybordi 09/09/2024 04:05 PM EST RP Workstation: [id number]   ECHOCARDIOGRAM COMPLETE Result Date: 09/09/2024    ECHOCARDIOGRAM REPORT   Patient Name:   Tyrone Newton  Date of Exam: 09/09/2024 Medical Rec #:  [id number]        Height:       75.0 in Accession #:    [id number]       Weight:       175.0 lb Date of Birth:  December 29, 1945        BSA:          2.073 m Patient Age:    78 years         BP:           128/78 mmHg Patient Gender: M                HR:           72 bpm. Exam Location:  Inpatient Procedure: 2D Echo (Both Spectral and Color Flow Doppler were utilized during            procedure). Indications:    Stroke  History:        Patient has no prior history of Echocardiogram examinations.                 Arrythmias:Atrial Fibrillation.  Sonographer:    Charmaine Gaskins Referring Phys: [id number] Collen Hostler IMPRESSIONS  1. Left ventricular ejection fraction, by estimation, is 60 to 65%. The left  ventricle has normal function. The left ventricle has no regional wall motion abnormalities. Left ventricular diastolic parameters are indeterminate.  2. Right ventricular systolic function is normal. The right ventricular size is normal. There is normal pulmonary artery systolic pressure.  3. The mitral valve is normal in structure. Moderate mitral valve regurgitation. No evidence of mitral stenosis.  4. Tricuspid valve regurgitation is mild to moderate.  5. The aortic valve is tricuspid. There is mild calcification of the aortic valve. Aortic valve regurgitation is mild to moderate. Aortic valve sclerosis is present, with no evidence of aortic valve stenosis.  6. Aortic dilatation noted. Aneurysm of the ascending aorta, measuring 45 mm.  7. The inferior vena cava is normal in size with greater than 50% respiratory variability, suggesting right atrial pressure of 3 mmHg. Conclusion(s)/Recommendation(s): No intracardiac source of embolism detected on this transthoracic study. Consider a transesophageal echocardiogram to exclude cardiac source of embolism if clinically indicated. FINDINGS  Left Ventricle: Left ventricular ejection fraction, by estimation, is 60 to 65%. The left ventricle has  normal function. The left ventricle has no regional wall motion abnormalities. The left ventricular internal cavity size was normal in size. There is  no left ventricular hypertrophy. Left ventricular diastolic parameters are indeterminate. Right Ventricle: The right ventricular size is normal. No increase in right ventricular wall thickness. Right ventricular systolic function is normal. There is normal pulmonary artery systolic pressure. The tricuspid regurgitant velocity is 2.56 m/s, and  with an assumed right atrial pressure of 3 mmHg, the estimated right ventricular systolic pressure is 29.2 mmHg. Left Atrium: Left atrial size was normal in size. Right Atrium: Right atrial size was normal in size. Pericardium: There is no evidence of pericardial effusion. Mitral Valve: The mitral valve is normal in structure. Moderate mitral valve regurgitation. No evidence of mitral valve stenosis. Tricuspid Valve: The tricuspid valve is normal in structure. Tricuspid valve regurgitation is mild to moderate. No evidence of tricuspid stenosis. Aortic Valve: The aortic valve is tricuspid. There is mild calcification of the aortic valve. Aortic valve regurgitation is mild to moderate. Aortic valve sclerosis is present, with no evidence of aortic valve stenosis. Pulmonic Valve: The pulmonic valve was normal in structure. Pulmonic valve regurgitation is mild. No evidence of pulmonic stenosis. Aorta: Aortic dilatation noted. There is an aneurysm involving the ascending aorta measuring 45 mm. Venous: The inferior vena cava is normal in size with greater than 50% respiratory variability, suggesting right atrial pressure of 3 mmHg. IAS/Shunts: No atrial level shunt detected by color flow Doppler.  LEFT VENTRICLE PLAX 2D LVIDd:         4.20 cm     Diastology LVIDs:         3.00 cm     LV e' medial:    12.70 cm/s LV PW:         1.00 cm     LV E/e' medial:  7.8 LV IVS:        1.00 cm     LV e' lateral:   15.40 cm/s LVOT diam:     2.20 cm      LV E/e' lateral: 6.5 LVOT Area:     3.80 cm  LV Volumes (MOD) LV vol d, MOD A2C: 70.7 ml LV vol d, MOD A4C: 86.5 ml LV vol s, MOD A2C: 27.7 ml LV vol s, MOD A4C: 29.1 ml LV SV MOD A2C:     43.0 ml LV SV MOD A4C:     86.5 ml LV  SV MOD BP:      50.5 ml RIGHT VENTRICLE RV Basal diam:  3.70 cm RV Mid diam:    4.20 cm RV S prime:     17.70 cm/s LEFT ATRIUM              Index        RIGHT ATRIUM           Index LA diam:        3.40 cm  1.64 cm/m   RA Area:     26.80 cm LA Vol (A2C):   111.0 ml 53.54 ml/m  RA Volume:   79.00 ml  38.10 ml/m LA Vol (A4C):   104.0 ml 50.16 ml/m LA Biplane Vol: 108.0 ml 52.09 ml/m   AORTA Ao Root diam: 3.70 cm Ao Asc diam:  4.62 cm MITRAL VALVE               TRICUSPID VALVE MV Area (PHT): 4.38 cm    TR Peak grad:   26.2 mmHg MV Decel Time: 173 msec    TR Vmax:        256.00 cm/s MV E velocity: 99.50 cm/s                            SHUNTS                            Systemic Diam: 2.20 cm Oneil Parchment MD Electronically signed by Oneil Parchment MD Signature Date/Time: 09/09/2024/3:16:51 PM    Final    MR BRAIN WO CONTRAST Result Date: 09/08/2024 EXAM: MRI Brain Without Contrast 09/08/2024 06:48:39 PM TECHNIQUE: Multiplanar multisequence MRI of the head/brain was performed without the administration of intravenous contrast. COMPARISON: CT head earlier today. 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 Edema without mass effect. No intracranial hemorrhage. No mass. No midline shift. No hydrocephalus. The sella is unremarkable. Normal flow voids. ORBITS: No acute abnormality. SINUSES AND MASTOIDS: No acute abnormality. BONES AND SOFT TISSUES: Normal marrow signal. No acute soft tissue abnormality. IMPRESSION: 1. 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 2. Additional suspected punctate acute infarcts the left frontal and right occipital lobes. Electronically signed by: Gilmore Molt  MD 09/08/2024 07:57 PM EST RP Workstation: [id number]   CT Head Wo Contrast Result Date: 09/08/2024 EXAM: CT HEAD WITHOUT 09/08/2024 01:50:57 PM TECHNIQUE: CT of the head was performed without the administration of intravenous contrast. Automated exposure control, iterative reconstruction, and/or weight based adjustment of the mA/kV was utilized to reduce the radiation dose to as low as reasonably achievable. COMPARISON: None available. CLINICAL HISTORY: Transient ischemic attack (TIA) FINDINGS: BRAIN AND VENTRICLES: No acute intracranial hemorrhage. No mass effect or midline shift. No extra-axial fluid collection. No evidence of acute infarct. No hydrocephalus. Chronic ischemic white matter changes. Atherosclerotic calcifications in cavernous internal carotid arteries. ORBITS: Right lens replacement. SINUSES AND MASTOIDS: No acute abnormality. SOFT TISSUES AND SKULL: No acute skull fracture. No acute soft tissue abnormality. IMPRESSION: 1. No acute intracranial abnormality. 2. Chronic ischemic white matter changes. Electronically signed by: Franky Stanford MD 09/08/2024 02:59 PM EST RP Workstation: [id number]   DG Chest Portable 1 View Result Date: 09/08/2024 CLINICAL DATA:  Cough. EXAM: PORTABLE CHEST 1 VIEW COMPARISON:  10/03/2023. FINDINGS: The heart size and mediastinal contours are unchanged. Known bilateral calcified pleural plaques. No appreciable acute airspace consolidation.  No pleural effusion or pneumothorax. No acute osseous abnormality. IMPRESSION: 1. No acute cardiopulmonary findings. 2. Known calcified bilateral pleural plaques, compatible with asbestos related pleural disease. Electronically Signed   By: Harrietta Sherry M.D.   On: 09/08/2024 13:18     PHYSICAL EXAM  Temp:  [97.7 F (36.5 C)-98.1 F (36.7 C)] 98.1 F (36.7 C) (12/12 0900) Pulse Rate:  [82-94] 87 (12/12 1017) Resp:  [14-22] 19 (12/12 1017) BP: (107-151)/(67-68) 151/68 (12/12 0900) SpO2:  [94 %-96 %] 96 % (12/12  1017)  General - Well nourished, well developed, agitated and restless  Ophthalmologic - fundi not visualized due to noncooperation.  Cardiovascular - irregularly irregular heart rate and rhythm.  Neuro - eyes closed, briefly open eyes to voice  PERRL.  Does Not answer questions,  Bilateral UEs at least 4/5 held by family due to agitation. Bilaterally LEs 3-/5. Sensation and coordination not corporative, gait not tested.    ASSESSMENT/PLAN Tyrone Newton is a 78 y.o. male with history of myasthenia gravis on prednisone  and Mestinon , hypertension, A-fib on Eliquis , CAD, AV block type I, gout, prostate cancer status post surgery admitted for abrupt functional decline with whole body weakness and soft voice with falling at home.     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 Additional suspected punctate acute infarcts the left frontal and right occipital lobes. CTA head and neck pending May consider pan CT to rule out advanced malignancy if workup unrevealing 2D Echo EF 60 to 65% LDL 31 HgbA1c 10.7 Eliquis  for VTE prophylaxis Eliquis  (apixaban ) daily prior to admission, now on Eliquis  (apixaban ) daily.  Ongoing aggressive stroke risk factor management Therapy recommendations: SNF Disposition: Pending  Myasthenia gravis Diagnosed 2 years ago in [place] Was on Vyvgart in the past but not effective per family Now on prednisone  and Mestinon  No respiratory distress now DC Lipitor due to potential worsening MG  General Functional decline Delirium  Complicated by weight loss, frequent UTI, uncontrolled diabetes and Lipitor use Multifactorial with current UTI, PNA, AKI DC  Lipitor S/p seroquel and haldolol Given history of prostate cancer, recommend to rule out metastasis if workup unrevealing PT and OT  AF Rate controlled On Eliquis  at home but with doubtful compliance given patient taking care of medication by himself, however family insists compliance Continue Eliquis   Diabetes HgbA1c 10.7 goal < 7.0 Uncontrolled CBG monitoring SSI DM education and close PCP follow up  Hypertension Stable Avoid low BP Long term BP goal normotensive  Hyperlipidemia Home meds: Lipitor 40 LDL 31, goal < 70 Now on no statin given MG with worsening weakness  UTI PNA 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 Interval progression of retrocardiac collapse/consolidation with small left effusion. On Rocephin  and flagyl  AKI Metabolic acidosis Creatinine 1.20-1.49-1.77 Renal ultrasound pending On sodium bicarb   Other Stroke Risk Factors Advanced age CAD  Other Active Problems AV block type I Prostate cancer status post surgery Right lateral chest hematoma, surgery on board  Hospital day # 3  Harlene Pouch, MSN, NP-C [facility] See AMION or use Epic Chat  ATTENDING NOTE: I reviewed above note and agree with the assessment and plan. Pt was seen and examined.   Wife and son at bedside.  Patient still agitated, especially agitated with core Trak tube today.  On wrist soft restraint.  Continue management for UTI, PNA  and AKI and metabolic acidosis.  Family failed exhausted and drained.  Wife may consider GOC discussion soon but need time to think about more.  Continue current management, once appropriate, will consider CTA head and neck and pan CT. will follow  For detailed assessment and plan, please refer to above as I have made changes wherever appropriate.   Ary Cummins, MD PhD Stroke Neurology 09/11/2024 4:41 PM    To contact Stroke Continuity provider, please refer to [email or web address]. After hours, contact General Neurology

## 2024-09-11 NOTE — Progress Notes (Signed)
 Initial Nutrition Assessment  DOCUMENTATION CODES:   Severe malnutrition in context of chronic illness  INTERVENTION:  Initiate tube feeding via Cortrak: Osmolite 1.5 at 55 ml/h (1320 ml per day) Initiate at 25ml/hr and increase by 10ml/hr q8h until goal rate achieved Prosource TF20 60 ml daily FWF 150 ml q8h (450 ml per day)  Provides 2060 kcal, 103 gm protein, 1006 ml free water daily (1456 ml per day FWF + TF)   Monitor magnesium, potassium, and phosphorus daily for at least 3 days, MD to replete as needed, as pt is at risk for refeeding syndrome given severe malnutrition.   Mg/PHOS add on to morning labs to assess for refeeding  Add Thiamine 100 mg daily for 7 days   Draw vitamin C and vitamin K lab r/t presence of petechiae   Collect new weight to assess trend   NUTRITION DIAGNOSIS:  Severe Malnutrition related to chronic illness as evidenced by severe muscle depletion, percent weight loss.  GOAL:   Patient will meet greater than or equal to 90% of their needs  MONITOR:   TF tolerance, Diet advancement, Labs, Weight trends  REASON FOR ASSESSMENT:   Consult Enteral/tube feeding initiation and management  ASSESSMENT:   Pt with PMH significant for: myasthenia gravis, CVA, PAF, HTN, T2DM, GERD, gout, and prostate cancer s/p prostatectomy. Admitted for acute CVA complicated by fever likely 2/2 UTI/aspiration PNA.  1209 - admitted; urine culture: + E.coli  12/10 - NPO; MRI brain: acute infarcts in cerebellum bilaterally; suspect acute punctuate infarct in left and right frontal occipital lobes 12/11 - CXR: interval progression of retrocardiac collapse/consolidation with small left pleural effusion 12/12 - MBS: NPO; Cortrak placed; TF started  MRI of brain shoed acute infarcts in cerebellum bilaterally and suspected acute punctuate infarcy in left and right frontal occipital lobes.He did experience some delirium overnight. Seen by speech this morning for MBS with  recommendations for NPO. Family amicable to Cortrak placement.   24 Hour Recall B: turkey kielbasa, eggs, roll, w/ OJ + water L: out to eat or meat and potatoes D: protein, starch, veg OR occasionally a hot dog with water Snacks: Premier Protein shake x1 daily  Pt resting in bed at time of visit. Arouses easily, but difficult to keep awake and with unintelligible speech. Family at bedside and able to provide nutrition-related history. They state he was eating as normal prior to admission. Diet recall above given, however wife reports that lately intake has been variable with patient stating, at times, I just don't want anything. Cannot endorse a reason as to why, but she also states he has shown progressive weight loss since January of 2024, which she has been concerned about for some time and discussed with his PCP.   No issues chewing or swallowing at baseline. Family reports regular BMs PTA that were loose. He does fluctuate from one extreme to the other, they report.   Given observed muscle depletions and inability to discern consistent level of intake PTA in addition to reported significant weight loss, will monitor for refeeding and add thiamine with initiation of tube feedings. Also observed with petichiae to upper body c/f vitamin Cor vitamin K deficiency. Will draw lab to assess.  Admit/Current Weight: 79.4 kg  Wife reports UBW of around 275-280lbs at the beginning of 2024. Endorses 100lbs weight loss over last year. Unable to report on recent weight trend as she reports progressive weight loss since that time. Per chart review, observed with 10.7% weight loss in last  three months, which is considered clinically significant for the time frame. Last weight prior to three month time frame was over a year ago. Some mild edema on exam, which is baseline, per family.    Drains/Lines: Cortrak (gastric) placed 09/11/2024   Given refeed risk, will add Mg/PHOS draw on to this morning's  lab panel to assess baseline status. Lab draws entered to assess for refeeding over next three days.   Meds: SS Novolog , pantoprazole , prednisone , IV ABX, IV sodium bicarb in dextrose  Will likely see blood sugars rise in the presence of poor control at baseline, prednisone  use, and initiation of nutrition support that is continuous. Will likely need insulin  regimen adjustment.  Labs: Na+ 140--->145 (wdl) K+ 3.3 (L) Crt 1.49>1.77 (H) BUN 27>34(H) CBGs 142-212 x24 hours A1c  10.8 (08/2024)    NUTRITION - FOCUSED PHYSICAL EXAM: Patient meets criteria for severe malnutrition based off depletions and reported weight loss as well as observed weight trend.   Flowsheet Row Most Recent Value  Orbital Region Moderate depletion  Upper Arm Region Moderate depletion  Thoracic and Lumbar Region Moderate depletion  Buccal Region Mild depletion  Temple Region Mild depletion  Clavicle Bone Region Severe depletion  Clavicle and Acromion Bone Region Severe depletion  Scapular Bone Region Severe depletion  Dorsal Hand No depletion  Patellar Region Mild depletion  Anterior Thigh Region Severe depletion  Posterior Calf Region Moderate depletion  Edema (RD Assessment) Mild  Hair Reviewed  Eyes Reviewed  Mouth Reviewed  Skin Reviewed  Nails Reviewed      Diet Order:   Diet Order             Diet NPO time specified Except for: Ice Chips, Sips with Meds  Diet effective now                   EDUCATION NEEDS:   Not appropriate for education at this time  Skin:  Skin Assessment: Reviewed RN Assessment  Last BM:  12/11 - type 6/7 x4  Height:  Ht Readings from Last 1 Encounters:  09/08/24 6' 3 (1.905 m)   Weight:  Wt Readings from Last 1 Encounters:  09/08/24 79.4 kg    Ideal Body Weight:  89.1 kg  BMI:  Body mass index is 21.87 kg/m.  Estimated Nutritional Needs:   Kcal:  2000-2200 kcals  Protein:  95-110g  Fluid:  >2L/day   Blair Deaner MS, RD,  LDN Registered Dietitian Clinical Nutrition RD Inpatient Contact Info in Amion

## 2024-09-11 NOTE — Progress Notes (Signed)
 Remains agitated, restless, disoriented this shift. Pulling off gown and pulling at lines and equipment. Resistant to care--yelling, swinging, and pushing against staff, pulling at side rails. Wife remains at bedside but unable to redirect patient without staff assistance. Mitt placed to R hand for patient safety. Wife stating that she believes mitts make patient more agitated but agitation did not increase or decrease after mitt was placed. Reinforced to her that mitts are for patient safety and to prevent him from pulling out needed medical equipment. Pt positioned for comfort. Side rails up, bed alarm engaged, call bell in reach. No acute distress noted at this time.

## 2024-09-11 NOTE — Progress Notes (Signed)
 Requested to change meds to per tube.  Sergio Batch, PharmD, BCIDP, AAHIVP, CPP Infectious Disease Pharmacist 09/11/2024 1:54 PM

## 2024-09-11 NOTE — Consult Note (Signed)
 Palliative Medicine Inpatient Consult Note  Consulting Provider: Dr. Raenelle  Reason for consult:   Palliative Care Consult Services Palliative Medicine Consult  Reason for Consult? goc    09/11/2024  HPI:  Per intake H&P --> Patient is a 78 y.o.  male with history of myasthenia gravis, CVA, PAF, CAD, prostate cancer-s/p prostatectomy-presented with difficulty ambulating, slurred speech-upon further eval-found to have acute CVA.  Further hospital course complicated by fever secondary to probable complicated UTI/aspiration pneumonia, and delirium.   Palliative care has been asked to support additional goals of care conversations.   Clinical Assessment/Goals of Care:  *Please note that this is a verbal dictation therefore any spelling or grammatical errors are due to the Dragon Medical One system interpretation.  I have reviewed medical records including EPIC notes, labs and imaging, received report from bedside RN, assessed the patient who is lying in bed somnolent and occasionally opening his eyes but not interactive.    I met with patient's wife Nena and stepson Michael to further discuss diagnosis prognosis, GOC, EOL wishes, disposition and options.   I introduced Palliative Medicine as specialized medical care for people living with serious illness. It focuses on providing relief from the symptoms and stress of a serious illness. The goal is to improve quality of life for both the patient and the family.  Medical History Review and Understanding:  A review of Kyre's past medical history significant for atrial fibrillation, coronary artery disease, myasthenia gravis, prostate cancer status post prostatectomy, GERD hypertension, & cataract was completed.  Social History:  Kentrell is from [place] .  He and his wife Nena will have been married 34 years this upcoming Sunday.  They share 1 son, and 3 grandchildren.  Blaise owned [employer] and garden.   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  Srijan per his wife is extremely intelligent, strong-willed, honest, and convicted.  Chares has a strong Christian faith and would often give back to the community.  Functional and Nutritional State:  Preceding hospitalization, Kayvion had been able to mobilize with a cane slowly.  His wife shares over the last 2 years he has began to decline secondary to his myasthenia gravis diagnosis.  He has had weight loss.  Advance Directives:  A detailed discussion was had today regarding advanced directives.  Niels does not have advanced directives on file however he and his wife spoke recently about what his wishes would be if something were to happen.  Code Status:  Concepts specific to code status, artifical feeding and hydration, continued IV antibiotics and rehospitalization was had.  The difference between a aggressive medical intervention path  and a palliative comfort care path for this patient at this time was had.   Encouraged patient/family to consider DNR/DNI status understanding evidenced based poor outcomes in similar hospitalized patient, as the cause of arrest is likely associated with advanced chronic/terminal illness rather than an easily reversible acute cardio-pulmonary event. I explained that DNR/DNI does not change the medical plan and it only comes into effect after a person has arrested (died).  It is a protective measure to keep us  from harming the patient in their last moments of life.   Patient's spouse understands what DO NOT RESUSCITATE is versus doing aggressive efforts.  She shares per her conversations with her husband he would want to fight to see if he could regain strength.  We reviewed that it may be worth considering the present condition he is in and how such  an effort may look in the short and long run. Provided  [employer] Choices for [employer] booklet.    Discussion:  A discussion of the circumstances surrounding  hospitalization for [facility] was completed.  We reviewed that Jatniel had come into the hospital 3 days ago in the setting of being more physically weak he was identified to be in a myasthenia gravis flare, though then identified to have an acute CVA.   Patient's family understand that today he failed his modified barium swallow study.  The determination was made to provide artificial nutrition via core track tube to see if patient may improve over time with this intervention.  Patient's wife is hopeful if he can gain some strength from nutrition he may be able to reach some degree of recovery.  Patient's wife and stepson understand he may not recover and that this would be a very difficult trajectory.  The emotional tone was very sad during my time at bedside and family would like to allow time to see if the patient can improve.  We reviewed continuing conversations in the oncoming days pending patient's improvements and/or declines.  Patient's family understands he may not recover from this and if not, more difficult decisions would need to be made.   Discussed the importance of continued conversation with family and their  medical providers regarding overall plan of care and treatment options, ensuring decisions are within the context of the patients values and GOCs.  Decision Maker: GERALYNN LUGENE Johann, Emergency Contact: [phone] (Mobile)   SUMMARY OF RECOMMENDATIONS   Full code/ Full scope of care  Allowing time for outcomes  Open and honest conversations held with patient's spouse about potential outcomes --> patient spouse is most hopeful he can recover from this but if not she understands the alternative decisions that would need to be made inclusive of potential hospice care  Emotional support provided  The palliative medicine team will continue to follow along with Mr. Melendrez during hospitalization  Code Status/Advance Care Planning: FULL CODE   Palliative Prophylaxis:   Aspiration, Bowel Regimen, Delirium Protocol, Frequent Pain Assessment, Oral Care, Palliative Wound Care, and Turn Reposition  Additional Recommendations (Limitations, Scope, Preferences): Continue current care Psycho-social/Spiritual:  Desire for further Chaplaincy support: Yes Additional Recommendations: Education on disease burden   Prognosis: At this time it appears limited given patient's chronic disease burden, recent acute CVA, inability to eat and drink/swallow safely  Discharge Planning: Discharge plan to be determined  Vitals:   09/11/24 0900 09/11/24 1017  BP: (!) 151/68   Pulse: 94 87  Resp: 14 19  Temp: 98.1 F (36.7 C)   SpO2: 94% 96%    Intake/Output Summary (Last 24 hours) at 09/11/2024 1454 Last data filed at 09/11/2024 0900 Gross per 24 hour  Intake 2019.05 ml  Output --  Net 2019.05 ml   Last Weight  Most recent update: 09/08/2024 10:30 AM    Weight  79.4 kg (175 lb)            LABS: CBC:    Component Value Date/Time   WBC 8.9 09/11/2024 0340   HGB 11.7 (L) 09/11/2024 0340   HCT 35.6 (L) 09/11/2024 0340   PLT 125 (L) 09/11/2024 0340   MCV 89.2 09/11/2024 0340   NEUTROABS 6.2 09/08/2024 1117   LYMPHSABS 0.4 (L) 09/08/2024 1117   MONOABS 0.5 09/08/2024 1117   EOSABS 0.0 09/08/2024 1117   BASOSABS 0.0 09/08/2024 1117   Comprehensive Metabolic Panel:    Component Value Date/Time  NA 145 09/11/2024 0340   K 3.3 (L) 09/11/2024 0340   CL 109 09/11/2024 0340   CO2 18 (L) 09/11/2024 0340   BUN 34 (H) 09/11/2024 0340   CREATININE 1.77 (H) 09/11/2024 0340   GLUCOSE 212 (H) 09/11/2024 0340   CALCIUM 8.0 (L) 09/11/2024 0340   AST 15 09/11/2024 0340   ALT 18 09/11/2024 0340   ALKPHOS 41 09/11/2024 0340   BILITOT 1.3 (H) 09/11/2024 0340   PROT 5.2 (L) 09/11/2024 0340   ALBUMIN 2.9 (L) 09/11/2024 0340   Gen: Elderly Caucasian male chronically ill-appearing HEENT: Core track in place, dry mucous membranes CV: Regular rate and rhythm PULM:  On room air breathing is even and nonlabored ABD: soft/nontender EXT: No edema Neuro: Somnolent occasionally opens eyes  PPS: 10%   This conversation/these recommendations were discussed with patient primary care team, Dr. Raenelle ______________________________________________________ Rosaline Becton [facility] Palliative Medicine Team Team Cell Phone: [phone] Please utilize secure chat with additional questions, if there is no response within 30 minutes please call the above phone number  Total Time: 75 Billing based on MDM: High  Palliative Medicine Team providers are available by phone from 7am to 7pm daily and can be reached through the team cell phone.  Should this patient require assistance outside of these hours, please call the patient's attending physician.

## 2024-09-11 NOTE — Progress Notes (Signed)
 RT NOTE: RT to room for NIF/VC parameters.  PT performed with best effort he could.                                          Best                 %Pred                          FVC                             1.53                 31.5      FEV1                           1.03                   29.4 FEV1/FVC                   .67                   ---- [id number] [L/s]             .62                   25.6 PEF [L/s]                     2.10                 25.2 FET [s]                        7.42                 --- FEV1Q                        2.06                ----   PT attempted NIF multiple times but was  unable to comprehend he needed to inhale  and kept blowing out instead. Unable to get NIF.

## 2024-09-11 NOTE — Progress Notes (Signed)
 OT Cancellation Note  Patient Details Name: Tyrone Newton MRN: [id number] DOB: 1945/10/20   Cancelled Treatment:    Reason Eval/Treat Not Completed: Patient at procedure or test/ unavailable.  Off the floor for MBS.    Tyrone Newton Tyrone Newton 09/11/2024, 9:43 AM 09/11/2024  RP, OTR/L  Acute Rehabilitation Services  Office:  [phone]

## 2024-09-11 NOTE — Progress Notes (Signed)
 Modified Barium Swallow Study  Patient Details  Name: Tyrone Newton MRN: [id number] Date of Birth: 1946/09/28  Today's Date: 09/11/2024  Modified Barium Swallow completed.  Full report located under Chart Review in the Imaging Section.  History of Present Illness Anh Mangano is an 78/M, presented to the ED with weakness, requiring more assistance to ambulate.  Chest x-ray with no acute abnormality. 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   Per family, recent decline in overall function over the past 2-3 weeks.   PMH of CVA, myasthenia gravis, prostate cancer, hypertension, CAD, A-fib, gout.   Clinical Impression Pt exhibits severe oropharyngeal and pharyngoesophageal dysphagia with chronic, frank aspiration with all consistencies (nectar, thin and honey). Slow, weak and delayed lingual propulsion with diffuse residue in oral cavity with barium falling to valleculae and pyriform sinuses and spilling into the trachea. Pt is unable to significantly reduce or clear pharyngeal residue. When swallows are initiated there is reduced tongue base retraction, no epiglottic inversion, significantly reduced pharyngeal contraction with residue remaining in the vallecuale. There is also decreased hyolaryngeal excursion with minimal PES distention and only very small amounts enter the esophagus and majority remains in pyriform sinuses. Initially he sensed penetrates, cleared throat to eject small amount but is re-penetrated and aspirated  without sensation (PAS 8) with nectar and thin that mixes with residue.  He has an intermittent and ineffective cough with honey thick aspiration. Pt unable to follow strategies due to cognitive status. Mild esophageal retention. Pt is not safe for po's and MD discussed options with family for po's versus comfort feeds as pt has had a prolonged decline in addition  to acute CVA. SLP explained results and family chose Cortrak. Allow ice chips after oral care and educated family. Will trial exercises if pt able and repeat MBS when appropriate for any improvements and  to assist family in goals of care.  Palliative care has been consulted. Factors that may increase risk of adverse event in presence of aspiration Noe & Lianne 2021): Reduced cognitive function;Frail or deconditioned;Frequent aspiration of large volumes  Swallow Evaluation Recommendations Recommendations: NPO Medication Administration: Via alternative means Oral care recommendations: Oral care QID (4x/day)      [name] 09/11/2024,6:37 PM

## 2024-09-11 NOTE — Progress Notes (Signed)
 Off tele during transport to swallow testing, Ghimire, MD aware

## 2024-09-12 ENCOUNTER — Other Ambulatory Visit (HOSPITAL_COMMUNITY): Payer: Self-pay

## 2024-09-12 ENCOUNTER — Inpatient Hospital Stay (HOSPITAL_COMMUNITY)

## 2024-09-12 DIAGNOSIS — R296 Repeated falls: Secondary | ICD-10-CM | POA: Diagnosis not present

## 2024-09-12 DIAGNOSIS — Z7901 Long term (current) use of anticoagulants: Secondary | ICD-10-CM | POA: Diagnosis not present

## 2024-09-12 DIAGNOSIS — I4891 Unspecified atrial fibrillation: Secondary | ICD-10-CM | POA: Diagnosis not present

## 2024-09-12 DIAGNOSIS — R531 Weakness: Secondary | ICD-10-CM | POA: Diagnosis not present

## 2024-09-12 LAB — COMPREHENSIVE METABOLIC PANEL WITH GFR
ALT: 17 U/L (ref 0–44)
AST: 15 U/L (ref 15–41)
Albumin: 2.8 g/dL — ABNORMAL LOW (ref 3.5–5.0)
Alkaline Phosphatase: 45 U/L (ref 38–126)
Anion gap: 13 (ref 5–15)
BUN: 31 mg/dL — ABNORMAL HIGH (ref 8–23)
CO2: 28 mmol/L (ref 22–32)
Calcium: 7.8 mg/dL — ABNORMAL LOW (ref 8.9–10.3)
Chloride: 105 mmol/L (ref 98–111)
Creatinine, Ser: 1.98 mg/dL — ABNORMAL HIGH (ref 0.61–1.24)
GFR, Estimated: 34 mL/min — ABNORMAL LOW (ref >60)
Glucose, Bld: 345 mg/dL — ABNORMAL HIGH (ref 70–99)
Potassium: 2.7 mmol/L — CL (ref 3.5–5.1)
Sodium: 146 mmol/L — ABNORMAL HIGH (ref 135–145)
Total Bilirubin: 0.8 mg/dL (ref 0.0–1.2)
Total Protein: 5 g/dL — ABNORMAL LOW (ref 6.5–8.1)

## 2024-09-12 LAB — MAGNESIUM: Magnesium: 2 mg/dL (ref 1.7–2.4)

## 2024-09-12 LAB — CBC
HCT: 36.4 % — ABNORMAL LOW (ref 39.0–52.0)
Hemoglobin: 12 g/dL — ABNORMAL LOW (ref 13.0–17.0)
MCH: 29.7 pg (ref 26.0–34.0)
MCHC: 33 g/dL (ref 30.0–36.0)
MCV: 90.1 fL (ref 80.0–100.0)
Platelets: 107 K/uL — ABNORMAL LOW (ref 150–400)
RBC: 4.04 MIL/uL — ABNORMAL LOW (ref 4.22–5.81)
RDW: 14.7 % (ref 11.5–15.5)
WBC: 6.1 K/uL (ref 4.0–10.5)
nRBC: 0 % (ref 0.0–0.2)

## 2024-09-12 LAB — GLUCOSE, CAPILLARY
Glucose-Capillary: 223 mg/dL — ABNORMAL HIGH (ref 70–99)
Glucose-Capillary: 261 mg/dL — ABNORMAL HIGH (ref 70–99)
Glucose-Capillary: 269 mg/dL — ABNORMAL HIGH (ref 70–99)
Glucose-Capillary: 303 mg/dL — ABNORMAL HIGH (ref 70–99)
Glucose-Capillary: 319 mg/dL — ABNORMAL HIGH (ref 70–99)
Glucose-Capillary: 320 mg/dL — ABNORMAL HIGH (ref 70–99)

## 2024-09-12 LAB — PHOSPHORUS: Phosphorus: 2.5 mg/dL (ref 2.5–4.6)

## 2024-09-12 MED ORDER — POTASSIUM CHLORIDE 10 MEQ/100ML IV SOLN
10.0000 meq | INTRAVENOUS | Status: AC
  Administered 2024-09-12 (×6): 10 meq via INTRAVENOUS
  Filled 2024-09-12 (×5): qty 100

## 2024-09-12 MED ORDER — FREE WATER
200.0000 mL | Freq: Three times a day (TID) | Status: DC
  Administered 2024-09-12 – 2024-09-13 (×3): 200 mL

## 2024-09-12 MED ORDER — INSULIN ASPART 100 UNIT/ML IJ SOLN
0.0000 [IU] | INTRAMUSCULAR | Status: DC
  Administered 2024-09-12: 5 [IU] via SUBCUTANEOUS
  Administered 2024-09-12: 7 [IU] via SUBCUTANEOUS
  Administered 2024-09-12: 5 [IU] via SUBCUTANEOUS
  Administered 2024-09-12: 3 [IU] via SUBCUTANEOUS
  Administered 2024-09-12 – 2024-09-13 (×2): 7 [IU] via SUBCUTANEOUS
  Filled 2024-09-12: qty 7
  Filled 2024-09-12 (×2): qty 3
  Filled 2024-09-12: qty 7
  Filled 2024-09-12: qty 3
  Filled 2024-09-12: qty 5

## 2024-09-12 MED ORDER — LACTATED RINGERS IV SOLN: INTRAVENOUS | Status: DC

## 2024-09-12 MED ORDER — INSULIN GLARGINE 100 UNIT/ML ~~LOC~~ SOLN
8.0000 [IU] | Freq: Every day | SUBCUTANEOUS | Status: DC
  Administered 2024-09-12: 8 [IU] via SUBCUTANEOUS
  Filled 2024-09-12 (×2): qty 0.08

## 2024-09-12 MED ORDER — PYRIDOSTIGMINE BROMIDE 60 MG PO TABS
60.0000 mg | ORAL_TABLET | Freq: Three times a day (TID) | ORAL | Status: DC
  Administered 2024-09-13: 60 mg
  Filled 2024-09-12 (×2): qty 1

## 2024-09-12 NOTE — Progress Notes (Signed)
 OT Cancellation Note  Patient Details Name: Tyrone Newton MRN: [id number] DOB: [birth year]   Cancelled Treatment:    Reason Eval/Treat Not Completed: Fatigue/lethargy limiting ability to participate (per RN pt too lethargic today for OT eval, will follow up next date as schedule permits)  [name], OTD, OTR/L SecureChat Preferred Acute Rehab [phone]   [name] 09/12/2024, 9:55 AM

## 2024-09-12 NOTE — Plan of Care (Signed)

## 2024-09-12 NOTE — Progress Notes (Addendum)
 PROGRESS NOTE        PATIENT DETAILS Name: Tyrone Newton Age: 78 y.o. Sex: male Date of Birth: Dec 17, 1945 Admit Date: 09/08/2024 Admitting Physician Marsa KATHEE Scurry, MD ERE:Tnnid, Jayson, MD  Brief Summary: Patient is a 78 y.o.  male with history of myasthenia gravis, CVA, PAF, CAD, prostate cancer-s/p prostatectomy-presented with difficulty ambulating, slurred speech-upon further eval-found to have acute CVA.  Further hospital course complicated by fever secondary to probable complicated UTI/aspiration pneumonia, and delirium.  Significant events: 12/9>> admit to [facility] 12/11>> confused overnight-febrile-repeat chest x-ray with possible left-sided consolidation-on IV Rocephin -Flagyl  added 12/12>> failed MBS-after discussion with family-Cortrak tube inserted-palliative care consulted  Significant studies: 12/9>> MRI brain: Acute infarcts in the cerebellum bilaterally.  Suspected acute punctate infarct in the left frontal/right frontal occipital lobes. 12/10>> CT head: No acute intracranial hemorrhage 12/10>> echo: EF 60-65% moderate MR, mild to moderate AR 12/10>> LDL: 31 12/11>> CXR: Interval progression of retrocardiac collapse/consolidation with small left pleural effusion 12/11>> A1c: 10.8 12/11>> EEG: No seizures. 12/12>> renal ultrasound: No hydronephrosis  Significant microbiology data: 12/9>> urine culture: E. coli 12/9>> blood culture: No growth 12/11>> blood culture: No growth  Procedures: None  Consults: Neurology Palliative  Subjective: Sleeping-in 2-point soft restraints to prevent him pulling out his NG tube.  Remains restless at times.  Briefly will open eyes-intermittently will follow commands.  Speech remains slurred.  Objective: Vitals: Blood pressure (!) 101/55, pulse 99, temperature 99.9 F (37.7 C), temperature source Axillary, resp. rate (!) 29, height 6' 3 (1.905 m), weight 79.4 kg, SpO2 94%.    Exam: Sleeping/sedated-slurred speech when he talks-occasional follow commands Not in any distress Chest: Clear to auscultation Abdomen: Soft nontender nondistended Neuro exam: Difficult but will withdraw all 4 extremities to pain.  Pertinent Labs/Radiology:    Latest Ref Rng & Units 09/12/2024    4:31 AM 09/11/2024    3:40 AM 09/10/2024    2:44 AM  CBC  WBC 4.0 - 10.5 K/uL 6.1  8.9  8.8   Hemoglobin 13.0 - 17.0 g/dL 87.9  88.2  87.1   Hematocrit 39.0 - 52.0 % 36.4  35.6  39.6   Platelets 150 - 400 K/uL 107  125  130     Lab Results  Component Value Date   NA 146 (H) 09/12/2024   K 2.7 (LL) 09/12/2024   CL 105 09/12/2024   CO2 28 09/12/2024      Assessment/Plan: Acute CVA Embolic etiology in spite of being on Eliquis  (?  Compliance)-some concern that he may have occult malignancy that is causing hypercoagulable state given history of weight loss. Appears to have significant dysarthria-and oropharyngeal dysphagia-otherwise exam is difficult Remains on Eliquis  Cortrak tube inserted 12/12 after extensive discussion with family Already had significant decline in function for almost a year before this hospitalization-not sure if he will be able to regain his prior level of function even if he survives this acute hospitalization.  Goals of care ongoing-family discussion in progress-allowing some time for clinical outcomes. CTA planned but not yet done given concern for worsening AKI. Both palliative care/neurology following  Acute metabolic encephalopathy Probably secondary to complicated UTI/aspiration pneumonia/AKI Remains encephalopathic-although slightly improved  Continue to underlying etiologies Continue Seroquel  Requiring restraints to keep cortrak tube in place Recent TSH/ammonia stable Spot EEG negative for seizures Delirium precautions.  AKI Suspect hemodynamically mediated but continues  to worsen Renal ultrasound negative for hydronephrosis Overnight  bladder scans were stable-however still have some concerns that he may be retaining urine given that he has a external urinary sphincter prosthetic device in place.  Continue to monitor with frequent bladder scans-if there is any suspicion that he is retaining-will need to discuss with urology to see if we can place a Foley catheter. In the interim-continue supportive care- avoid nephrotoxic agents Wife understands that if he were to continue to deteriorate-he would likely not be a HD candidate-see below-goals of care in progress.  Addendum Spoke with family-we went over the bladder scan results-for now we have elected to pursue supportive care and not consult urology or place Foley catheter.  Await goals of care discussion-allow time for clinical outcomes.  Metabolic acidosis Secondary to above Resolved with IV bicarb  Hypokalemia Replete/recheck  Aspiration pneumonia Febrile overnight-no leukocytosis Repeat CXR continues to show left-sided infiltrates Continue Rocephin /Flagyl  Remains n.p.o.-has NG tube in place. Remains at risk for ongoing aspiration episodes given ongoing issues with encephalopathy.  Complicated UTI-E. coli Continue IV Rocephin   History of prostate cancer-s/p prostatectomy, s/p external urethral sphincter device See above  Right chest wall mass Felt to be a firm hematoma-has had prior imaging studies before this hospitalization-per spouse-completed a course of doxycycline. General surgery consulted 12/10-family refused open evacuation-supportive care for now.  PAF Telemetry monitoring Eliquis   CAD Eliquis  Holding Lipitor-given history of myasthenia gravis  Prior history of CVA See above  DM 2 CBGs on the higher side-now that he is on tube feeds Continue SSI Add Lantus  8 units and see how he does  Recent Labs    09/11/24 2357 09/12/24 0444 09/12/24 0832  GLUCAP 262* 320* 319*     History of myasthenia gravis Appears stable Given his  restlessness/agitation-monitoring neph/FCD will be almost impossible Continue pyridostigmine /prednisone -follow clinically.  History of prostate cancer Device in place for urethral sphincter to urinate Supportive care  Anterior neck swelling Firm right sided neck mass-very hard to discern on exam-given agitation/delirium See below regarding plans for pan CT.  Weight loss Significant weight loss of almost 100 pounds in the past 1 year TSH stable Given recurrent embolic CVA-concern for hypercoagulable state from occult malignancy Once renal function/mentation permits-Will plan an CT.  Palliative care Patient has had significant decline in function for almost a year-this is his second CVA-he now unfortunately appears to have severe oropharyngeal dysphagia-significant slurred speech-after extensive discussion with family-NG tube inserted yesterday-family fully well aware that we will have to use medication like Seroquel /sedation for him to keep the NGT open given his level of encephalopathy.  This morning-appears to have worsening renal function-not any better.  Again had a long discussion with the patient's spouse at bedside-she is aware of the option of transition to comfort measures-she is aware of poor overall long-term prognosis and that he would likely not be back to his prior baseline on discharge.  Will continue to allow time for clinical outcomes-family discussion but suspect patient best benefit from transition to full comfort measures at this point.  Addendum Reached out to spouse/stepson and other family members at bedside-okay for DNR-they are leaning towards initiating hospice/comfort care but are not yet ready to make the decision yet.  Continue IV fluids/IV antibiotics/tube feeds for now-DNR ordered-palliative care/[facility] will reengage with family tomorrow.  Code status:   Code Status: Full Code   DVT Prophylaxis: apixaban  (ELIQUIS ) tablet 5 mg    Family Communication: Spouse at  bedside 12/13  Disposition Plan: Status  is: Inpatient Remains inpatient appropriate because: Severity of illness   Planned Discharge Destination:Skilled nursing facility   Diet: Diet Order             Diet NPO time specified Except for: Ice Chips, Sips with Meds  Diet effective now                     Antimicrobial agents: Anti-infectives (From admission, onward)    Start     Dose/Rate Route Frequency Ordered Stop   09/10/24 1215  cefTRIAXone  (ROCEPHIN ) 2 g in sodium chloride  0.9 % 100 mL IVPB        2 g 200 mL/hr over 30 Minutes Intravenous Every 24 hours 09/10/24 1120     09/10/24 1215  metroNIDAZOLE  (FLAGYL ) IVPB 500 mg        500 mg 100 mL/hr over 60 Minutes Intravenous Every 12 hours 09/10/24 1120     09/10/24 0800  Ampicillin -Sulbactam (UNASYN ) 3 g in sodium chloride  0.9 % 100 mL IVPB  Status:  Discontinued        3 g 200 mL/hr over 30 Minutes Intravenous Every 6 hours 09/10/24 0648 09/10/24 1120   09/09/24 1200  cefTRIAXone  (ROCEPHIN ) 1 g in sodium chloride  0.9 % 100 mL IVPB  Status:  Discontinued        1 g 200 mL/hr over 30 Minutes Intravenous Every 24 hours 09/09/24 1133 09/10/24 0644        MEDICATIONS: Scheduled Meds:  apixaban   5 mg Per Tube BID   feeding supplement (PROSource TF20)  60 mL Per Tube Daily   free water   200 mL Per Tube Q8H   insulin  aspart  0-9 Units Subcutaneous Q4H   pantoprazole  (PROTONIX ) IV  40 mg Intravenous Q24H   predniSONE   25 mg Per Tube Once per day on Sunday Monday Tuesday Wednesday Thursday Saturday   [START ON 09/18/2024] predniSONE   65 mg Per Tube Q Fri   pyridostigmine   20.4 mg Per Tube Q8H   QUEtiapine   50 mg Per Tube QHS   sodium chloride  flush  3 mL Intravenous Q12H   thiamine   100 mg Per Tube Daily   Continuous Infusions:  cefTRIAXone  (ROCEPHIN )  IV Stopped (09/11/24 1633)   feeding supplement (OSMOLITE 1.5 CAL) 55 mL/hr at 09/12/24 0314   lactated ringers  75 mL/hr at 09/12/24 0715   metronidazole  500 mg  (09/11/24 2319)   potassium chloride  10 mEq (09/12/24 1033)   PRN Meds:.acetaminophen  **OR** acetaminophen , albuterol , artificial tears, haloperidol  lactate, iohexol , polyethylene glycol   I have personally reviewed following labs and imaging studies  LABORATORY DATA: CBC: Recent Labs  Lab 09/08/24 1117 09/08/24 1418 09/09/24 0348 09/10/24 0244 09/11/24 0340 09/12/24 0431  WBC 7.2  --  6.8 8.8 8.9 6.1  NEUTROABS 6.2  --   --   --   --   --   HGB 12.2* 11.6* 12.0* 12.8* 11.7* 12.0*  HCT 38.2* 34.0* 39.7 39.6 35.6* 36.4*  MCV 90.7  --  97.3 91.0 89.2 90.1  PLT 133*  --  117* 130* 125* 107*    Basic Metabolic Panel: Recent Labs  Lab 09/08/24 1117 09/08/24 1418 09/09/24 0348 09/10/24 0244 09/11/24 0340 09/12/24 0431  NA 140 140 143 143 145 146*  K 3.7 3.7 3.3* 3.5 3.3* 2.7*  CL 104  --  116* 112* 109 105  CO2 25  --  14* 13* 18* 28  GLUCOSE 149*  --  80 142* 212* 345*  BUN 24*  --  24* 27* 34* 31*  CREATININE 1.24  --  1.20 1.49* 1.77* 1.98*  CALCIUM 9.2  --  7.3* 8.6* 8.0* 7.8*  MG  --   --   --   --  2.0 2.0  PHOS  --   --   --   --  3.1 2.5    GFR: Estimated Creatinine Clearance: 33.4 mL/min (A) (by C-G formula based on SCr of 1.98 mg/dL (H)).  Liver Function Tests: Recent Labs  Lab 09/08/24 1117 09/09/24 0348 09/10/24 0244 09/11/24 0340 09/12/24 0431  AST 16 19 15 15 15   ALT 21 15 20 18 17   ALKPHOS 39 34* 43 41 45  BILITOT 2.5* 1.9* 2.5* 1.3* 0.8  PROT 5.4* 4.3* 5.3* 5.2* 5.0*  ALBUMIN 3.5 2.6* 3.3* 2.9* 2.8*   No results for input(s): LIPASE, AMYLASE in the last 168 hours. Recent Labs  Lab 09/08/24 1411  AMMONIA 25    Coagulation Profile: No results for input(s): INR, PROTIME in the last 168 hours.  Cardiac Enzymes: No results for input(s): CKTOTAL, CKMB, CKMBINDEX, TROPONINI in the last 168 hours.  BNP (last 3 results) No results for input(s): PROBNP in the last 8760 hours.  Lipid Profile: Recent Labs     09/09/24 1557  CHOL 82  HDL 34*  LDLCALC 31  TRIG 83  CHOLHDL 2.4    Thyroid Function Tests: No results for input(s): TSH, T4TOTAL, FREET4, T3FREE, THYROIDAB in the last 72 hours.   Anemia Panel: No results for input(s): VITAMINB12, FOLATE, FERRITIN, TIBC, IRON, RETICCTPCT in the last 72 hours.  Urine analysis:    Component Value Date/Time   COLORURINE AMBER (A) 09/08/2024 1500   APPEARANCEUR CLOUDY (A) 09/08/2024 1500   LABSPEC 1.023 09/08/2024 1500   PHURINE 5.0 09/08/2024 1500   GLUCOSEU >=500 (A) 09/08/2024 1500   HGBUR LARGE (A) 09/08/2024 1500   BILIRUBINUR NEGATIVE 09/08/2024 1500   KETONESUR 20 (A) 09/08/2024 1500   PROTEINUR NEGATIVE 09/08/2024 1500   UROBILINOGEN 1.0 07/28/2013 2109   NITRITE POSITIVE (A) 09/08/2024 1500   LEUKOCYTESUR SMALL (A) 09/08/2024 1500    Sepsis Labs: Lactic Acid, Venous    Component Value Date/Time   LATICACIDVEN 1.0 09/10/2024 0727    MICROBIOLOGY: Recent Results (from the past 240 hours)  Urine Culture     Status: Abnormal   Collection Time: 09/08/24  3:00 PM   Specimen: Urine, Random  Result Value Ref Range Status   Specimen Description URINE, RANDOM  Final   Special Requests   Final    NONE Reflexed from [id number] Performed at [facility] Lab, [address] [place], [place] [address]    Culture >=100,000 COLONIES/mL ESCHERICHIA COLI (A)  Final   Report Status 09/10/2024 FINAL  Final   Organism ID, Bacteria ESCHERICHIA COLI (A)  Final      Susceptibility   Escherichia coli - MIC*    AMPICILLIN  >=32 RESISTANT Resistant     CEFAZOLIN (URINE) Value in next row Sensitive      8 SENSITIVEThis is a modified FDA-approved test that has been validated and its performance characteristics determined by the reporting laboratory.  This laboratory is certified under the Clinical Laboratory Improvement Amendments CLIA as qualified to perform high complexity clinical laboratory testing.    CEFEPIME Value in  next row Sensitive      8 SENSITIVEThis is a modified FDA-approved test that has been validated and its performance characteristics determined by the reporting laboratory.  This laboratory is certified under the Clinical  Laboratory Improvement Amendments CLIA as qualified to perform high complexity clinical laboratory testing.    ERTAPENEM Value in next row Sensitive      8 SENSITIVEThis is a modified FDA-approved test that has been validated and its performance characteristics determined by the reporting laboratory.  This laboratory is certified under the Clinical Laboratory Improvement Amendments CLIA as qualified to perform high complexity clinical laboratory testing.    CEFTRIAXONE  Value in next row Sensitive      8 SENSITIVEThis is a modified FDA-approved test that has been validated and its performance characteristics determined by the reporting laboratory.  This laboratory is certified under the Clinical Laboratory Improvement Amendments CLIA as qualified to perform high complexity clinical laboratory testing.    CIPROFLOXACIN  Value in next row Resistant      8 SENSITIVEThis is a modified FDA-approved test that has been validated and its performance characteristics determined by the reporting laboratory.  This laboratory is certified under the Clinical Laboratory Improvement Amendments CLIA as qualified to perform high complexity clinical laboratory testing.    GENTAMICIN Value in next row Resistant      8 SENSITIVEThis is a modified FDA-approved test that has been validated and its performance characteristics determined by the reporting laboratory.  This laboratory is certified under the Clinical Laboratory Improvement Amendments CLIA as qualified to perform high complexity clinical laboratory testing.    NITROFURANTOIN Value in next row Intermediate      8 SENSITIVEThis is a modified FDA-approved test that has been validated and its performance characteristics determined by the reporting  laboratory.  This laboratory is certified under the Clinical Laboratory Improvement Amendments CLIA as qualified to perform high complexity clinical laboratory testing.    TRIMETH/SULFA Value in next row Resistant      8 SENSITIVEThis is a modified FDA-approved test that has been validated and its performance characteristics determined by the reporting laboratory.  This laboratory is certified under the Clinical Laboratory Improvement Amendments CLIA as qualified to perform high complexity clinical laboratory testing.    AMPICILLIN /SULBACTAM Value in next row Intermediate      8 SENSITIVEThis is a modified FDA-approved test that has been validated and its performance characteristics determined by the reporting laboratory.  This laboratory is certified under the Clinical Laboratory Improvement Amendments CLIA as qualified to perform high complexity clinical laboratory testing.    PIP/TAZO Value in next row Sensitive      <=4 SENSITIVEThis is a modified FDA-approved test that has been validated and its performance characteristics determined by the reporting laboratory.  This laboratory is certified under the Clinical Laboratory Improvement Amendments CLIA as qualified to perform high complexity clinical laboratory testing.    MEROPENEM Value in next row Sensitive      <=4 SENSITIVEThis is a modified FDA-approved test that has been validated and its performance characteristics determined by the reporting laboratory.  This laboratory is certified under the Clinical Laboratory Improvement Amendments CLIA as qualified to perform high complexity clinical laboratory testing.    * >=100,000 COLONIES/mL ESCHERICHIA COLI  Blood culture (routine x 2)     Status: None (Preliminary result)   Collection Time: 09/08/24  3:03 PM   Specimen: BLOOD  Result Value Ref Range Status   Specimen Description BLOOD SITE NOT SPECIFIED  Final   Special Requests   Final    BOTTLES DRAWN AEROBIC AND ANAEROBIC Blood Culture  adequate volume   Culture   Final    NO GROWTH 4 DAYS Performed at [facility], [address] [place], [place] [address]    Report Status PENDING  Incomplete  Blood culture (routine x 2)     Status: None (Preliminary result)   Collection Time: 09/08/24  3:21 PM   Specimen: BLOOD  Result Value Ref Range Status   Specimen Description BLOOD SITE NOT SPECIFIED  Final   Special Requests   Final    BOTTLES DRAWN AEROBIC ONLY Blood Culture results may not be optimal due to an inadequate volume of blood received in culture bottles   Culture   Final    NO GROWTH 4 DAYS Performed at [facility], [address], [place], [place] [address]    Report Status PENDING  Incomplete  Culture, blood (Routine X 2) w Reflex to ID Panel     Status: None (Preliminary result)   Collection Time: 09/10/24  3:44 AM   Specimen: BLOOD RIGHT ARM  Result Value Ref Range Status   Specimen Description BLOOD RIGHT ARM  Final   Special Requests   Final    BOTTLES DRAWN AEROBIC AND ANAEROBIC Blood Culture adequate volume   Culture   Final    NO GROWTH 2 DAYS Performed at [facility], [address] [place], [place] [address]    Report Status PENDING  Incomplete  Culture, blood (Routine X 2) w Reflex to ID Panel     Status: None (Preliminary result)   Collection Time: 09/10/24  3:54 AM   Specimen: BLOOD RIGHT ARM  Result Value Ref Range Status   Specimen Description BLOOD RIGHT ARM  Final   Special Requests   Final    BOTTLES DRAWN AEROBIC AND ANAEROBIC Blood Culture adequate volume   Culture   Final    NO GROWTH 2 DAYS Performed at [facility], [address] [place], [place] [address]    Report Status PENDING  Incomplete    RADIOLOGY STUDIES/RESULTS: DG Chest Port 1V same Day Result Date: 09/12/2024 CLINICAL DATA:  Shortness of breath. EXAM: PORTABLE CHEST 1 VIEW COMPARISON:  09/10/2024 FINDINGS: The cardio pericardial silhouette is enlarged. Similar left base  collapse/consolidation. Calcified pleural plaques again noted. A feeding tube passes into the stomach although the distal tip position is not included on the film. IMPRESSION: 1. Similar left base collapse/consolidation. 2. Calcified pleural plaques. Electronically Signed   By: Camellia Candle M.D.   On: 09/12/2024 09:16   US  RENAL Result Date: 09/11/2024 EXAM: US  Retroperitoneum Complete, Renal. 09/11/2024 12:50:41 PM TECHNIQUE: Real-time ultrasonography of the retroperitoneum renal was performed. COMPARISON: 10/05/2023 CLINICAL HISTORY: AKI (acute kidney injury) FINDINGS: FINDINGS: RIGHT KIDNEY/URETER: Right kidney measures 12.4 x 5.8 x 7.0 cm. Right renal volume: 261.5 ml. Normal cortical echogenicity. Peripelvic cyst is noted in the upper pole, s table in appearance from the prior exam. No hydronephrosis noted. No calculus. No mass. Right ureteral jet is not visualized. LEFT KIDNEY/URETER: Left kidney measures 12.1 x 5.4 x 6.4 cm. Left renal volume: 226.5 ml. Normal cortical echogenicity. No hydronephrosis is noted. No calculus. A large 8.4 cm cyst is noted in the upper pole of the left kidney. This is simple in nature and no follow-up is recommended. Left ureteral jet is not visualized. BLADDER: Unremarkable appearance of the bladder. IMPRESSION: 1. No hydronephrosis to suggest obstructive etiology for acute kidney injury. 2. Bilateral simple cysts similar to those seen on the prior exam. No follow-up is recommended. Electronically signed by: Oneil Devonshire MD 09/11/2024 09:07 PM EST RP Workstation: [id number]   DG Swallowing Func-Speech  Pathology Result Date: 09/11/2024 Table formatting from the original result was not included. Modified Barium Swallow Study Patient Details Name: Tyrone Newton MRN: [id number] Date of Birth: [birth year] Today's Date: 09/11/2024 HPI/PMH: HPI: Tyrone Newton is an 78/M, presented to the ED with weakness, requiring more assistance to ambulate.  Chest x-ray with no acute  abnormality. 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   Per family, recent decline in overall function over the past 2-3 weeks.   PMH of CVA, myasthenia gravis, prostate cancer, hypertension, CAD, A-fib, gout. Clinical Impression: Clinical Impression: Pt exhibits severe oropharyngeal and pharyngoesophageal dysphagia with chronic, frank aspiration with all consistencies (nectar, thin and honey). Slow, weak and delayed lingual propulsion with diffuse residue in oral cavity with barium falling to valleculae and pyriform sinuses and spilling into the trachea. Pt is unable to significantly reduce or clear pharyngeal residue. When swallows are initiated there is reduced tongue base retraction, no epiglottic inversion, significantly reduced pharyngeal contraction with residue remaining in the vallecuale. There is also decreased hyolaryngeal excursion with minimal PES distention and only very small amounts enter the esophagus and majority remains in pyriform sinuses. Initially he sensed penetrates, cleared throat to eject small amount but is re-penetrated and aspirated  without sensation (PAS 8) with nectar and thin that mixes with residue.  He has an intermittent and ineffective cough with honey thick aspiration. Pt unable to follow strategies due to cognitive status. Mild esophageal retention. Pt is not safe for po's and MD discussed options with family for po's versus comfort feeds as pt has had a prolonged decline in addition to acute CVA. SLP explained results and family chose Cortrak. Allow ice chips after oral care and educated family. Will trial exercises if pt able and repeat MBS when appropriate for any improvements and  to assist family in goals of care.  Palliative care has been consulted. Factors that may increase risk of adverse event in presence of aspiration Noe & Lianne 2021): Factors that  may increase risk of adverse event in presence of aspiration Noe & Lianne 2021): Reduced cognitive function; Frail or deconditioned; Frequent aspiration of large volumes Recommendations/Plan: Swallowing Evaluation Recommendations Swallowing Evaluation Recommendations Recommendations: NPO Medication Administration: Via alternative means Oral care recommendations: Oral care QID (4x/day) Treatment Plan Treatment Plan Treatment recommendations: Defer treatment plan to SLP at other venue (see follow-up recommendations) Follow-up recommendations: Skilled nursing-short term rehab (<3 hours/day) Functional status assessment: Patient has had a recent decline in their functional status and demonstrates the ability to make significant improvements in function in a reasonable and predictable amount of time. Treatment frequency: Min 2x/week Treatment duration: 2 weeks Interventions: Patient/family education; Oropharyngeal exercises Recommendations Recommendations for follow up therapy are one component of a multi-disciplinary discharge planning process, led by the attending physician.  Recommendations may be updated based on patient status, additional functional criteria and insurance authorization. Assessment: Orofacial Exam: Orofacial Exam Oral Cavity: Oral Hygiene: Xerostomia Oral Cavity - Dentition: Adequate natural dentition Orofacial Anatomy: WFL Oral Motor/Sensory Function: Suspected cranial nerve impairment CN V - Trigeminal: Not tested CN VII - Facial: Left motor impairment Anatomy: Anatomy: Other (Comment) (possible exaggerated cervical lordosis?) Boluses Administered: Boluses Administered Boluses Administered: Thin liquids (Level 0); Moderately thick liquids (Level 3, honey thick); Mildly thick liquids (Level 2, nectar thick)  Oral Impairment Domain: Oral Impairment Domain Lip Closure: Interlabial escape, no progression to anterior lip Tongue control during bolus hold:  Not tested (unable to hold) Bolus  preparation/mastication: -- (solid n ot given) Bolus transport/lingual motion: Slow tongue motion (very weak) Oral residue: Residue collection on oral structures Location of oral residue : Floor of mouth; Tongue Initiation of pharyngeal swallow : Pyriform sinuses  Pharyngeal Impairment Domain: Pharyngeal Impairment Domain Soft palate elevation: No bolus between soft palate (SP)/pharyngeal wall (PW) Laryngeal elevation: Partial superior movement of thyroid cartilage/partial approximation of arytenoids to epiglottic petiole Anterior hyoid excursion: Partial anterior movement Epiglottic movement: No inversion Laryngeal vestibule closure: Incomplete, narrow column air/contrast in laryngeal vestibule Pharyngeal stripping wave : Present - diminished Pharyngeal contraction (A/P view only): N/A Pharyngoesophageal segment opening: Minimal distention/minimal duration, marked obstruction of flow Tongue base retraction: Wide column of contrast or air between tongue base and PPW Pharyngeal residue: Majority of contrast within or on pharyngeal structures Location of pharyngeal residue: Tongue base; Valleculae; Pyriform sinuses  Esophageal Impairment Domain: Esophageal Impairment Domain Esophageal clearance upright position: Esophageal retention Pill: No data recorded Penetration/Aspiration Scale Score: Penetration/Aspiration Scale Score 4.  Material enters airway, CONTACTS cords then ejected out: Thin liquids (Level 0) 7.  Material enters airway, passes BELOW cords and not ejected out despite cough attempt by patient: Moderately thick liquids (Level 3, honey thick) 8.  Material enters airway, passes BELOW cords without attempt by patient to eject out (silent aspiration) : Mildly thick liquids (Level 2, nectar thick); Thin liquids (Level 0) Compensatory Strategies: Compensatory Strategies Compensatory strategies: No (pt unable)   General Information: Caregiver present: No  Diet Prior to this Study: NPO   Temperature : Normal    Respiratory Status: WFL   Supplemental O2: None (Room air)   History of Recent Intubation: No  Behavior/Cognition: -- (intermittent simple but not for strategies) Self-Feeding Abilities: Dependent for feeding Baseline vocal quality/speech: Hypophonia/low volume Volitional Cough: Unable to elicit Volitional Swallow: Unable to elicit Exam Limitations: No limitations Goal Planning: Prognosis for improved oropharyngeal function: Fair Barriers to Reach Goals: Cognitive deficits; Severity of deficits No data recorded No data recorded Consulted and agree with results and recommendations: Family member/caregiver; Physician; Nurse Pain: Pain Assessment Pain Assessment: No/denies pain End of Session: Start Time:SLP Start Time (ACUTE ONLY): 0930 Stop Time: SLP Stop Time (ACUTE ONLY): 0947 Time Calculation:SLP Time Calculation (min) (ACUTE ONLY): 17 min Charges: SLP Evaluations $ SLP Speech Visit: 1 Visit SLP Evaluations $MBS Swallow: 1 Procedure SLP visit diagnosis: SLP Visit Diagnosis: Dysphagia, oropharyngeal phase (R13.12); Dysphagia, pharyngoesophageal phase (R13.14) Past Medical History: Past Medical History: Diagnosis Date  Cancer (HCC)   Diabetes (HCC) 09/08/2024  GERD (gastroesophageal reflux disease)   Gout   HTN (hypertension) 07/31/2024  Myasthenia gravis [facility])  Past Surgical History: Past Surgical History: Procedure Laterality Date  CHOLECYSTECTOMY    PROSTATE SURGERY    due to cancer [name] 09/11/2024, 6:38 PM  EEG adult Result Date: 09/10/2024 Shelton Arlin KIDD, MD     09/10/2024  5:11 PM Patient Name: Tyrone Newton MRN: [id number] Epilepsy Attending: Arlin KIDD Shelton Referring Physician/Provider: Raenelle Donalda HERO, MD Date: 09/10/2024 Duration: 22.15 mins Patient history: 78yo M with ams. EEG to evaluate for seizure Level of alertness: Awake AEDs during EEG study: None Technical aspects: This EEG study was done with scalp electrodes positioned according to the 10-20 International system of  electrode placement. Electrical activity was reviewed with band pass filter of 1-70Hz , sensitivity of 7 uV/mm, display speed of 22mm/sec with a 60Hz  notched filter applied as appropriate. EEG data were recorded continuously and digitally stored.  Video  monitoring was available and reviewed as appropriate. Description: EEG showed continuous generalized predominantly 5 to 6 Hz theta slowing admixed with intermittent 2-3hz  delta slowing. Hyperventilation and photic stimulation were not performed.   ABNORMALITY - Continuous slow, generalized IMPRESSION: This study is suggestive of generalized cerebral dysfunction ( encephalopathy). No seizures or epileptiform discharges were seen throughout the recording. Priyanka MALVA Krebs     LOS: 4 days   Donalda Applebaum, MD  [facility]    To contact the attending provider between 7A-7P or the covering provider during after hours 7P-7A, please log into the web site [email or web address] and access using universal [facility] password for that web site. If you do not have the password, please call the hospital operator.  09/12/2024, 10:37 AM

## 2024-09-12 NOTE — Progress Notes (Signed)
 STROKE TEAM PROGRESS NOTE   SUBJECTIVE (INTERVAL HISTORY) His wife and son and multiple family members are at the bedside. Patient is resting peacefully.  Family has met with palliative care team and are leaning towards comfort care   OBJECTIVE Temp:  [98.1 F (36.7 C)-101.9 F (38.8 C)] 99.9 F (37.7 C) (12/13 0834) Pulse Rate:  [79-100] 99 (12/13 0000) Cardiac Rhythm: Atrial fibrillation (12/13 0702) Resp:  [18-29] 29 (12/13 0442) BP: (101-156)/(55-79) 101/55 (12/13 0834) SpO2:  [94 %] 94 % (12/12 1742)  Recent Labs  Lab 09/11/24 1604 09/11/24 2022 09/11/24 2357 09/12/24 0444 09/12/24 0832  GLUCAP 200* 214* 262* 320* 319*   Recent Labs  Lab 09/08/24 1117 09/08/24 1418 09/09/24 0348 09/10/24 0244 09/11/24 0340 09/12/24 0431  NA 140 140 143 143 145 146*  K 3.7 3.7 3.3* 3.5 3.3* 2.7*  CL 104  --  116* 112* 109 105  CO2 25  --  14* 13* 18* 28  GLUCOSE 149*  --  80 142* 212* 345*  BUN 24*  --  24* 27* 34* 31*  CREATININE 1.24  --  1.20 1.49* 1.77* 1.98*  CALCIUM 9.2  --  7.3* 8.6* 8.0* 7.8*  MG  --   --   --   --  2.0 2.0  PHOS  --   --   --   --  3.1 2.5   Recent Labs  Lab 09/08/24 1117 09/09/24 0348 09/10/24 0244 09/11/24 0340 09/12/24 0431  AST 16 19 15 15 15   ALT 21 15 20 18 17   ALKPHOS 39 34* 43 41 45  BILITOT 2.5* 1.9* 2.5* 1.3* 0.8  PROT 5.4* 4.3* 5.3* 5.2* 5.0*  ALBUMIN 3.5 2.6* 3.3* 2.9* 2.8*   Recent Labs  Lab 09/08/24 1117 09/08/24 1418 09/09/24 0348 09/10/24 0244 09/11/24 0340 09/12/24 0431  WBC 7.2  --  6.8 8.8 8.9 6.1  NEUTROABS 6.2  --   --   --   --   --   HGB 12.2* 11.6* 12.0* 12.8* 11.7* 12.0*  HCT 38.2* 34.0* 39.7 39.6 35.6* 36.4*  MCV 90.7  --  97.3 91.0 89.2 90.1  PLT 133*  --  117* 130* 125* 107*   No results for input(s): CKTOTAL, CKMB, CKMBINDEX, TROPONINI in the last 168 hours. No results for input(s): LABPROT, INR in the last 72 hours. No results for input(s): COLORURINE, LABSPEC, PHURINE, GLUCOSEU,  HGBUR, BILIRUBINUR, KETONESUR, PROTEINUR, UROBILINOGEN, NITRITE, LEUKOCYTESUR in the last 72 hours.  Invalid input(s): APPERANCEUR      Component Value Date/Time   CHOL 82 09/09/2024 1557   TRIG 83 09/09/2024 1557   HDL 34 (L) 09/09/2024 1557   CHOLHDL 2.4 09/09/2024 1557   VLDL 17 09/09/2024 1557   LDLCALC 31 09/09/2024 1557   Lab Results  Component Value Date   HGBA1C 10.8 (H) 09/10/2024   No results found for: LABOPIA, COCAINSCRNUR, LABBENZ, AMPHETMU, THCU, LABBARB  Recent Labs  Lab 09/08/24 1117  ETH <15    I have personally reviewed the radiological images below and agree with the radiology interpretations.  DG Chest Port 1V same Day Result Date: 09/12/2024 CLINICAL DATA:  Shortness of breath. EXAM: PORTABLE CHEST 1 VIEW COMPARISON:  09/10/2024 FINDINGS: The cardio pericardial silhouette is enlarged. Similar left base collapse/consolidation. Calcified pleural plaques again noted. A feeding tube passes into the stomach although the distal tip position is not included on the film. IMPRESSION: 1. Similar left base collapse/consolidation. 2. Calcified pleural plaques. Electronically Signed   By: Camellia  Minus M.D.   On: 09/12/2024 09:16   US  RENAL Result Date: 09/11/2024 EXAM: US  Retroperitoneum Complete, Renal. 09/11/2024 12:50:41 PM TECHNIQUE: Real-time ultrasonography of the retroperitoneum renal was performed. COMPARISON: 10/05/2023 CLINICAL HISTORY: AKI (acute kidney injury) FINDINGS: FINDINGS: RIGHT KIDNEY/URETER: Right kidney measures 12.4 x 5.8 x 7.0 cm. Right renal volume: 261.5 ml. Normal cortical echogenicity. Peripelvic cyst is noted in the upper pole, s table in appearance from the prior exam. No hydronephrosis noted. No calculus. No mass. Right ureteral jet is not visualized. LEFT KIDNEY/URETER: Left kidney measures 12.1 x 5.4 x 6.4 cm. Left renal volume: 226.5 ml. Normal cortical echogenicity. No hydronephrosis is noted. No calculus. A large  8.4 cm cyst is noted in the upper pole of the left kidney. This is simple in nature and no follow-up is recommended. Left ureteral jet is not visualized. BLADDER: Unremarkable appearance of the bladder. IMPRESSION: 1. No hydronephrosis to suggest obstructive etiology for acute kidney injury. 2. Bilateral simple cysts similar to those seen on the prior exam. No follow-up is recommended. Electronically signed by: Oneil Devonshire MD 09/11/2024 09:07 PM EST RP Workstation: [id number] Swallowing Func-Speech Pathology Result Date: 09/11/2024 Table formatting from the original result was not included. Modified Barium Swallow Study Patient Details Name: REO PORTELA MRN: [id number] Date of Birth: 1946-05-07 Today's Date: 09/11/2024 HPI/PMH: HPI: Coran Dipaola is an 78/M, presented to the ED with weakness, requiring more assistance to ambulate.  Chest x-ray with no acute abnormality. 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   Per family, recent decline in overall function over the past 2-3 weeks.   PMH of CVA, myasthenia gravis, prostate cancer, hypertension, CAD, A-fib, gout. Clinical Impression: Clinical Impression: Pt exhibits severe oropharyngeal and pharyngoesophageal dysphagia with chronic, frank aspiration with all consistencies (nectar, thin and honey). Slow, weak and delayed lingual propulsion with diffuse residue in oral cavity with barium falling to valleculae and pyriform sinuses and spilling into the trachea. Pt is unable to significantly reduce or clear pharyngeal residue. When swallows are initiated there is reduced tongue base retraction, no epiglottic inversion, significantly reduced pharyngeal contraction with residue remaining in the vallecuale. There is also decreased hyolaryngeal excursion with minimal PES distention and only very small amounts enter the esophagus and majority remains  in pyriform sinuses. Initially he sensed penetrates, cleared throat to eject small amount but is re-penetrated and aspirated  without sensation (PAS 8) with nectar and thin that mixes with residue.  He has an intermittent and ineffective cough with honey thick aspiration. Pt unable to follow strategies due to cognitive status. Mild esophageal retention. Pt is not safe for po's and MD discussed options with family for po's versus comfort feeds as pt has had a prolonged decline in addition to acute CVA. SLP explained results and family chose Cortrak. Allow ice chips after oral care and educated family. Will trial exercises if pt able and repeat MBS when appropriate for any improvements and  to assist family in goals of care.  Palliative care has been consulted. Factors that may increase risk of adverse event in presence of aspiration Noe & Lianne 2021): Factors that may increase risk of adverse event in presence of aspiration Noe & Lianne 2021): Reduced cognitive function; Frail or deconditioned; Frequent aspiration of large volumes Recommendations/Plan: Swallowing Evaluation Recommendations Swallowing Evaluation Recommendations Recommendations: NPO Medication Administration: Via alternative means Oral care recommendations: Oral care QID (4x/day)  Treatment Plan Treatment Plan Treatment recommendations: Defer treatment plan to SLP at other venue (see follow-up recommendations) Follow-up recommendations: Skilled nursing-short term rehab (<3 hours/day) Functional status assessment: Patient has had a recent decline in their functional status and demonstrates the ability to make significant improvements in function in a reasonable and predictable amount of time. Treatment frequency: Min 2x/week Treatment duration: 2 weeks Interventions: Patient/family education; Oropharyngeal exercises Recommendations Recommendations for follow up therapy are one component of a multi-disciplinary discharge planning process, led  by the attending physician.  Recommendations may be updated based on patient status, additional functional criteria and insurance authorization. Assessment: Orofacial Exam: Orofacial Exam Oral Cavity: Oral Hygiene: Xerostomia Oral Cavity - Dentition: Adequate natural dentition Orofacial Anatomy: WFL Oral Motor/Sensory Function: Suspected cranial nerve impairment CN V - Trigeminal: Not tested CN VII - Facial: Left motor impairment Anatomy: Anatomy: Other (Comment) (possible exaggerated cervical lordosis?) Boluses Administered: Boluses Administered Boluses Administered: Thin liquids (Level 0); Moderately thick liquids (Level 3, honey thick); Mildly thick liquids (Level 2, nectar thick)  Oral Impairment Domain: Oral Impairment Domain Lip Closure: Interlabial escape, no progression to anterior lip Tongue control during bolus hold: Not tested (unable to hold) Bolus preparation/mastication: -- (solid n ot given) Bolus transport/lingual motion: Slow tongue motion (very weak) Oral residue: Residue collection on oral structures Location of oral residue : Floor of mouth; Tongue Initiation of pharyngeal swallow : Pyriform sinuses  Pharyngeal Impairment Domain: Pharyngeal Impairment Domain Soft palate elevation: No bolus between soft palate (SP)/pharyngeal wall (PW) Laryngeal elevation: Partial superior movement of thyroid cartilage/partial approximation of arytenoids to epiglottic petiole Anterior hyoid excursion: Partial anterior movement Epiglottic movement: No inversion Laryngeal vestibule closure: Incomplete, narrow column air/contrast in laryngeal vestibule Pharyngeal stripping wave : Present - diminished Pharyngeal contraction (A/P view only): N/A Pharyngoesophageal segment opening: Minimal distention/minimal duration, marked obstruction of flow Tongue base retraction: Wide column of contrast or air between tongue base and PPW Pharyngeal residue: Majority of contrast within or on pharyngeal structures Location of  pharyngeal residue: Tongue base; Valleculae; Pyriform sinuses  Esophageal Impairment Domain: Esophageal Impairment Domain Esophageal clearance upright position: Esophageal retention Pill: No data recorded Penetration/Aspiration Scale Score: Penetration/Aspiration Scale Score 4.  Material enters airway, CONTACTS cords then ejected out: Thin liquids (Level 0) 7.  Material enters airway, passes BELOW cords and not ejected out despite cough attempt by patient: Moderately thick liquids (Level 3, honey thick) 8.  Material enters airway, passes BELOW cords without attempt by patient to eject out (silent aspiration) : Mildly thick liquids (Level 2, nectar thick); Thin liquids (Level 0) Compensatory Strategies: Compensatory Strategies Compensatory strategies: No (pt unable)   General Information: Caregiver present: No  Diet Prior to this Study: NPO   Temperature : Normal   Respiratory Status: WFL   Supplemental O2: None (Room air)   History of Recent Intubation: No  Behavior/Cognition: -- (intermittent simple but not for strategies) Self-Feeding Abilities: Dependent for feeding Baseline vocal quality/speech: Hypophonia/low volume Volitional Cough: Unable to elicit Volitional Swallow: Unable to elicit Exam Limitations: No limitations Goal Planning: Prognosis for improved oropharyngeal function: Fair Barriers to Reach Goals: Cognitive deficits; Severity of deficits No data recorded No data recorded Consulted and agree with results and recommendations: Family member/caregiver; Physician; Nurse Pain: Pain Assessment Pain Assessment: No/denies pain End of Session: Start Time:SLP Start Time (ACUTE ONLY): 0930 Stop Time: SLP Stop Time (ACUTE ONLY): 0947 Time Calculation:SLP Time Calculation (min) (ACUTE ONLY): 17 min Charges: SLP Evaluations $ SLP Speech Visit: 1 Visit SLP Evaluations $MBS  Swallow: 1 Procedure SLP visit diagnosis: SLP Visit Diagnosis: Dysphagia, oropharyngeal phase (R13.12); Dysphagia, pharyngoesophageal phase  (R13.14) Past Medical History: Past Medical History: Diagnosis Date  Cancer (HCC)   Diabetes (HCC) 09/08/2024  GERD (gastroesophageal reflux disease)   Gout   HTN (hypertension) 07/31/2024  Myasthenia gravis [facility])  Past Surgical History: Past Surgical History: Procedure Laterality Date  CHOLECYSTECTOMY    PROSTATE SURGERY    due to cancer [name] 09/11/2024, 6:38 PM  EEG adult Result Date: 09/10/2024 Shelton Arlin KIDD, MD     09/10/2024  5:11 PM Patient Name: DONTAE MINERVA MRN: [id number] Epilepsy Attending: Arlin KIDD Shelton Referring Physician/Provider: Raenelle Donalda HERO, MD Date: 09/10/2024 Duration: 22.15 mins Patient history: 78yo M with ams. EEG to evaluate for seizure Level of alertness: Awake AEDs during EEG study: None Technical aspects: This EEG study was done with scalp electrodes positioned according to the 10-20 International system of electrode placement. Electrical activity was reviewed with band pass filter of 1-70Hz , sensitivity of 7 uV/mm, display speed of 3mm/sec with a 60Hz  notched filter applied as appropriate. EEG data were recorded continuously and digitally stored.  Video monitoring was available and reviewed as appropriate. Description: EEG showed continuous generalized predominantly 5 to 6 Hz theta slowing admixed with intermittent 2-3hz  delta slowing. Hyperventilation and photic stimulation were not performed.   ABNORMALITY - Continuous slow, generalized IMPRESSION: This study is suggestive of generalized cerebral dysfunction ( encephalopathy). No seizures or epileptiform discharges were seen throughout the recording. Arlin KIDD Shelton   DG Chest Port 1 View Result Date: 09/10/2024 CLINICAL DATA:  Fever. EXAM: PORTABLE CHEST 1 VIEW COMPARISON:  09/08/2024 FINDINGS: Heart size is upper normal to mildly enlarged. Interval progression of retrocardiac collapse/consolidation with small left effusion. Right lung remains clear. Calcified pleural plaques again noted.  Telemetry leads overlie the chest. IMPRESSION: Interval progression of retrocardiac collapse/consolidation with small left effusion. Electronically Signed   By: Camellia Candle M.D.   On: 09/10/2024 05:07   CT HEAD WO CONTRAST ( ) Result Date: 09/09/2024 EXAM: CT HEAD WITHOUT 09/09/2024 03:21:54 PM TECHNIQUE: CT of the head was performed without the administration of intravenous contrast. Automated exposure control, iterative reconstruction, and/or weight based adjustment of the mA/kV was utilized to reduce the radiation dose to as low as reasonably achievable. COMPARISON: MRI brain 09/08/2024. CT head dated 09/08/2024. CLINICAL HISTORY: Stroke/TIA, determine embolic source. FINDINGS: BRAIN AND VENTRICLES: No acute intracranial hemorrhage. No mass effect or midline shift. No extra-axial fluid collection. There is overall similar mild scattered white matter hypodensities, which are nonspecific but most commonly represent chronic microvascular ischemic changes. 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 No evidence of a new acute territorial infarction. No hydrocephalus. ORBITS: Post right native opacular lens replacement. SINUSES AND MASTOIDS: No acute abnormality. SOFT TISSUES AND SKULL: No acute skull fracture. No acute soft tissue abnormality. IMPRESSION: 1. No acute intracranial hemorrhage or evidence of a new acute territorial infarction. 2. Previously identified recent bilateral cerebellar infarctions are seen more clearly on recent MRI brain 09/08/2024, see separate report for further relevant evaluation. Electronically signed by: prentice bybordi 09/09/2024 04:05 PM EST RP Workstation: [id number]   ECHOCARDIOGRAM COMPLETE Result Date: 09/09/2024    ECHOCARDIOGRAM REPORT   Patient Name:   SKYE RODARTE Date of Exam: 09/09/2024 Medical Rec #:  [id number]        Height:        75.0 in  Accession #:    [id number]       Weight:       175.0 lb Date of Birth:  [birth year]        BSA:          2.073 m Patient Age:    78 years         BP:           128/78 mmHg Patient Gender: M                HR:           72 bpm. Exam Location:  Inpatient Procedure: 2D Echo (Both Spectral and Color Flow Doppler were utilized during            procedure). Indications:    Stroke  History:        Patient has no prior history of Echocardiogram examinations.                 Arrythmias:Atrial Fibrillation.  Sonographer:    Charmaine Gaskins Referring Phys: [id number] JINDONG XU IMPRESSIONS  1. Left ventricular ejection fraction, by estimation, is 60 to 65%. The left ventricle has normal function. The left ventricle has no regional wall motion abnormalities. Left ventricular diastolic parameters are indeterminate.  2. Right ventricular systolic function is normal. The right ventricular size is normal. There is normal pulmonary artery systolic pressure.  3. The mitral valve is normal in structure. Moderate mitral valve regurgitation. No evidence of mitral stenosis.  4. Tricuspid valve regurgitation is mild to moderate.  5. The aortic valve is tricuspid. There is mild calcification of the aortic valve. Aortic valve regurgitation is mild to moderate. Aortic valve sclerosis is present, with no evidence of aortic valve stenosis.  6. Aortic dilatation noted. Aneurysm of the ascending aorta, measuring 45 mm.  7. The inferior vena cava is normal in size with greater than 50% respiratory variability, suggesting right atrial pressure of 3 mmHg. Conclusion(s)/Recommendation(s): No intracardiac source of embolism detected on this transthoracic study. Consider a transesophageal echocardiogram to exclude cardiac source of embolism if clinically indicated. FINDINGS  Left Ventricle: Left ventricular ejection fraction, by estimation, is 60 to 65%. The left ventricle has normal function. The left ventricle has no regional wall motion  abnormalities. The left ventricular internal cavity size was normal in size. There is  no left ventricular hypertrophy. Left ventricular diastolic parameters are indeterminate. Right Ventricle: The right ventricular size is normal. No increase in right ventricular wall thickness. Right ventricular systolic function is normal. There is normal pulmonary artery systolic pressure. The tricuspid regurgitant velocity is 2.56 m/s, and  with an assumed right atrial pressure of 3 mmHg, the estimated right ventricular systolic pressure is 29.2 mmHg. Left Atrium: Left atrial size was normal in size. Right Atrium: Right atrial size was normal in size. Pericardium: There is no evidence of pericardial effusion. Mitral Valve: The mitral valve is normal in structure. Moderate mitral valve regurgitation. No evidence of mitral valve stenosis. Tricuspid Valve: The tricuspid valve is normal in structure. Tricuspid valve regurgitation is mild to moderate. No evidence of tricuspid stenosis. Aortic Valve: The aortic valve is tricuspid. There is mild calcification of the aortic valve. Aortic valve regurgitation is mild to moderate. Aortic valve sclerosis is present, with no evidence of aortic valve stenosis. Pulmonic Valve: The pulmonic valve was normal in structure. Pulmonic valve regurgitation is mild. No evidence of pulmonic stenosis. Aorta: Aortic dilatation noted. There is an aneurysm involving the ascending aorta measuring  45 mm. Venous: The inferior vena cava is normal in size with greater than 50% respiratory variability, suggesting right atrial pressure of 3 mmHg. IAS/Shunts: No atrial level shunt detected by color flow Doppler.  LEFT VENTRICLE PLAX 2D LVIDd:         4.20 cm     Diastology LVIDs:         3.00 cm     LV e' medial:    12.70 cm/s LV PW:         1.00 cm     LV E/e' medial:  7.8 LV IVS:        1.00 cm     LV e' lateral:   15.40 cm/s LVOT diam:     2.20 cm     LV E/e' lateral: 6.5 LVOT Area:     3.80 cm  LV Volumes  (MOD) LV vol d, MOD A2C: 70.7 ml LV vol d, MOD A4C: 86.5 ml LV vol s, MOD A2C: 27.7 ml LV vol s, MOD A4C: 29.1 ml LV SV MOD A2C:     43.0 ml LV SV MOD A4C:     86.5 ml LV SV MOD BP:      50.5 ml RIGHT VENTRICLE RV Basal diam:  3.70 cm RV Mid diam:    4.20 cm RV S prime:     17.70 cm/s LEFT ATRIUM              Index        RIGHT ATRIUM           Index LA diam:        3.40 cm  1.64 cm/m   RA Area:     26.80 cm LA Vol (A2C):   111.0 ml 53.54 ml/m  RA Volume:   79.00 ml  38.10 ml/m LA Vol (A4C):   104.0 ml 50.16 ml/m LA Biplane Vol: 108.0 ml 52.09 ml/m   AORTA Ao Root diam: 3.70 cm Ao Asc diam:  4.62 cm MITRAL VALVE               TRICUSPID VALVE MV Area (PHT): 4.38 cm    TR Peak grad:   26.2 mmHg MV Decel Time: 173 msec    TR Vmax:        256.00 cm/s MV E velocity: 99.50 cm/s                            SHUNTS                            Systemic Diam: 2.20 cm Oneil Parchment MD Electronically signed by Oneil Parchment MD Signature Date/Time: 09/09/2024/3:16:51 PM    Final    MR BRAIN WO CONTRAST Result Date: 09/08/2024 EXAM: MRI Brain Without Contrast 09/08/2024 06:48:39 PM TECHNIQUE: Multiplanar multisequence MRI of the head/brain was performed without the administration of intravenous contrast. COMPARISON: CT head earlier today. 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 Edema without mass effect. No intracranial hemorrhage. No mass. No midline shift. No hydrocephalus. The sella is unremarkable. Normal flow voids. ORBITS: No acute abnormality. SINUSES AND MASTOIDS: No acute abnormality. BONES AND SOFT TISSUES: Normal marrow signal. No acute soft tissue abnormality. IMPRESSION: 1. 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 2. Additional suspected punctate acute infarcts the left frontal and right occipital lobes. Electronically signed by: Gilmore  Joshua MD 09/08/2024 07:57 PM EST RP Workstation: [id number]   CT  Head Wo Contrast Result Date: 09/08/2024 EXAM: CT HEAD WITHOUT 09/08/2024 01:50:57 PM TECHNIQUE: CT of the head was performed without the administration of intravenous contrast. Automated exposure control, iterative reconstruction, and/or weight based adjustment of the mA/kV was utilized to reduce the radiation dose to as low as reasonably achievable. COMPARISON: None available. CLINICAL HISTORY: Transient ischemic attack (TIA) FINDINGS: BRAIN AND VENTRICLES: No acute intracranial hemorrhage. No mass effect or midline shift. No extra-axial fluid collection. No evidence of acute infarct. No hydrocephalus. Chronic ischemic white matter changes. Atherosclerotic calcifications in cavernous internal carotid arteries. ORBITS: Right lens replacement. SINUSES AND MASTOIDS: No acute abnormality. SOFT TISSUES AND SKULL: No acute skull fracture. No acute soft tissue abnormality. IMPRESSION: 1. No acute intracranial abnormality. 2. Chronic ischemic white matter changes. Electronically signed by: Franky Stanford MD 09/08/2024 02:59 PM EST RP Workstation: [id number]   DG Chest Portable 1 View Result Date: 09/08/2024 CLINICAL DATA:  Cough. EXAM: PORTABLE CHEST 1 VIEW COMPARISON:  10/03/2023. FINDINGS: The heart size and mediastinal contours are unchanged. Known bilateral calcified pleural plaques. No appreciable acute airspace consolidation. No pleural effusion or pneumothorax. No acute osseous abnormality. IMPRESSION: 1. No acute cardiopulmonary findings. 2. Known calcified bilateral pleural plaques, compatible with asbestos related pleural disease. Electronically Signed   By: Harrietta Sherry M.D.   On: 09/08/2024 13:18     PHYSICAL EXAM  Temp:  [98.1 F (36.7 C)-101.9 F (38.8 C)] 99.9 F (37.7 C) (12/13 0834) Pulse Rate:  [79-100] 99 (12/13 0000) Resp:  [18-29] 29 (12/13 0442) BP: (101-156)/(55-79) 101/55 (12/13 0834) SpO2:  [94 %] 94 % (12/12 1742)  General - Well nourished, well developed, agitated and  restless  Ophthalmologic - fundi not visualized due to noncooperation.  Cardiovascular - irregularly irregular heart rate and rhythm.  Neuro - eyes closed, briefly open eyes to voice  PERRL.  Does Not answer questions,  Bilateral UEs at least 4/5 held by family due to agitation. Bilaterally LEs 3-/5. Sensation and coordination not corporative, gait not tested.    ASSESSMENT/PLAN Mr. LINCON SAHLIN is a 78 y.o. male with history of myasthenia gravis on prednisone  and Mestinon , hypertension, A-fib on Eliquis , CAD, AV block type I, gout, prostate cancer status post surgery admitted for abrupt functional decline with whole body weakness and soft voice with falling at home.     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 Additional suspected punctate acute infarcts the left frontal and right occipital lobes. CTA head and neck pending May consider pan CT to rule out advanced malignancy if workup unrevealing 2D Echo EF 60 to 65% LDL 31 HgbA1c 10.7 Eliquis  for VTE prophylaxis Eliquis  (apixaban ) daily prior to admission, now on Eliquis  (apixaban ) daily.  Ongoing aggressive stroke risk factor management Therapy recommendations: SNF Disposition: Pending  Myasthenia gravis Diagnosed 2 years ago in [place] Was on Vyvgart in the past but not effective per family Now on prednisone  and Mestinon  No respiratory distress now DC Lipitor due to potential worsening MG  General Functional decline Delirium  Complicated by weight loss, frequent UTI, uncontrolled diabetes and Lipitor use Multifactorial with current UTI, PNA, AKI DC Lipitor S/p seroquel  and haldolol Given history of prostate  cancer, recommend to rule out metastasis if workup unrevealing PT and OT  AF Rate controlled On Eliquis  at home but with doubtful compliance given patient taking care of medication by himself, however family insists compliance Continue Eliquis   Diabetes HgbA1c 10.7 goal < 7.0 Uncontrolled CBG monitoring SSI DM education and close PCP follow up  Hypertension Stable Avoid low BP Long term BP goal normotensive  Hyperlipidemia Home meds: Lipitor 40 LDL 31, goal < 70 Now on no statin given MG with worsening weakness  UTI PNA 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 Interval progression of retrocardiac collapse/consolidation with small left effusion. On Rocephin  and flagyl   AKI Metabolic acidosis Creatinine 1.20-1.49-1.77 Renal ultrasound pending On sodium bicarb   Other Stroke Risk Factors Advanced age CAD  Other Active Problems AV block type I Prostate cancer status post surgery Right lateral chest hematoma, surgery on board  Hospital day # 4   I have personally obtained history,examined this patient, reviewed notes, independently viewed imaging studies, participated in medical decision making and plan of care.ROS completed by me personally and pertinent positives fully documented  I have made any additions or clarifications directly to the above note. Agree with note above.  Patient resting comfortably in bed.  Multiple family was at the bedside.  Family leaning towards comfort care but needs some more time.  Recommend discontinue further stroke related workup.  Discussed with Dr. Raenelle and wife and answered questions.  Stroke team will sign off.   I personally spent a total of 35 minutes in the care of the patient today including getting/reviewing separately obtained history, performing a medically  appropriate exam/evaluation, counseling and educating, placing orders, referring and communicating with other health care professionals, documenting clinical information in the EHR, independently interpreting results, and coordinating care.        Eather Popp, MD Medical Director [facility] Pager: [phone] 09/12/2024 12:06 PM    To contact Stroke Continuity provider, please refer to [email or web address]. After hours, contact General Neurology

## 2024-09-12 NOTE — Progress Notes (Signed)
 Pt sleeping and unable to do NIF / VC at this time.

## 2024-09-13 ENCOUNTER — Inpatient Hospital Stay (HOSPITAL_COMMUNITY)

## 2024-09-13 LAB — BASIC METABOLIC PANEL WITH GFR
Anion gap: 12 (ref 5–15)
BUN: 30 mg/dL — ABNORMAL HIGH (ref 8–23)
CO2: 27 mmol/L (ref 22–32)
Calcium: 8.2 mg/dL — ABNORMAL LOW (ref 8.9–10.3)
Chloride: 107 mmol/L (ref 98–111)
Creatinine, Ser: 1.6 mg/dL — ABNORMAL HIGH (ref 0.61–1.24)
GFR, Estimated: 43 mL/min — ABNORMAL LOW (ref >60)
Glucose, Bld: 288 mg/dL — ABNORMAL HIGH (ref 70–99)
Potassium: 3.4 mmol/L — ABNORMAL LOW (ref 3.5–5.1)
Sodium: 146 mmol/L — ABNORMAL HIGH (ref 135–145)

## 2024-09-13 LAB — CULTURE, BLOOD (ROUTINE X 2)
Culture: NO GROWTH
Culture: NO GROWTH
Special Requests: ADEQUATE

## 2024-09-13 LAB — CBC
HCT: 33.1 % — ABNORMAL LOW (ref 39.0–52.0)
Hemoglobin: 10.6 g/dL — ABNORMAL LOW (ref 13.0–17.0)
MCH: 29.7 pg (ref 26.0–34.0)
MCHC: 32 g/dL (ref 30.0–36.0)
MCV: 92.7 fL (ref 80.0–100.0)
Platelets: 114 K/uL — ABNORMAL LOW (ref 150–400)
RBC: 3.57 MIL/uL — ABNORMAL LOW (ref 4.22–5.81)
RDW: 14.7 % (ref 11.5–15.5)
WBC: 5.5 K/uL (ref 4.0–10.5)
nRBC: 0 % (ref 0.0–0.2)

## 2024-09-13 LAB — GLUCOSE, CAPILLARY
Glucose-Capillary: 312 mg/dL — ABNORMAL HIGH (ref 70–99)
Glucose-Capillary: 243 mg/dL — ABNORMAL HIGH (ref 70–99)
Glucose-Capillary: 219 mg/dL — ABNORMAL HIGH (ref 70–99)

## 2024-09-13 MED ORDER — ONDANSETRON 4 MG PO TBDP: 4.0000 mg | ORAL_TABLET | Freq: Four times a day (QID) | ORAL | Status: DC | PRN

## 2024-09-13 MED ORDER — IPRATROPIUM-ALBUTEROL 0.5-2.5 (3) MG/3ML IN SOLN: 3.0000 mL | Freq: Four times a day (QID) | RESPIRATORY_TRACT | Status: DC

## 2024-09-13 MED ORDER — ACETAMINOPHEN 325 MG PO TABS
650.0000 mg | ORAL_TABLET | ORAL | Status: DC | PRN
  Administered 2024-09-13: 650 mg via ORAL
  Filled 2024-09-13: qty 2

## 2024-09-13 MED ORDER — HYDROMORPHONE HCL 1 MG/ML IJ SOLN
0.5000 mg | INTRAMUSCULAR | Status: DC | PRN
  Administered 2024-09-13: 1 mg via INTRAVENOUS
  Filled 2024-09-13: qty 1

## 2024-09-13 MED ORDER — LORAZEPAM 1 MG PO TABS: 1.0000 mg | ORAL_TABLET | ORAL | Status: DC | PRN

## 2024-09-13 MED ORDER — BISACODYL 10 MG RE SUPP: 10.0000 mg | Freq: Every day | RECTAL | Status: DC | PRN

## 2024-09-13 MED ORDER — FENTANYL CITRATE (PF) 50 MCG/ML IJ SOSY
50.0000 ug | PREFILLED_SYRINGE | Freq: Four times a day (QID) | INTRAMUSCULAR | Status: DC
  Administered 2024-09-13 – 2024-09-14 (×5): 50 ug via INTRAVENOUS
  Filled 2024-09-13 (×5): qty 1

## 2024-09-13 MED ORDER — FENTANYL CITRATE (PF) 50 MCG/ML IJ SOSY
50.0000 ug | PREFILLED_SYRINGE | INTRAMUSCULAR | Status: DC | PRN
  Administered 2024-09-13 – 2024-09-14 (×3): 50 ug via INTRAVENOUS
  Filled 2024-09-13 (×3): qty 1

## 2024-09-13 MED ORDER — LORAZEPAM 2 MG/ML IJ SOLN: 1.0000 mg | INTRAMUSCULAR | Status: DC | PRN

## 2024-09-13 MED ORDER — GLYCOPYRROLATE 0.2 MG/ML IJ SOLN
0.1000 mg | Freq: Four times a day (QID) | INTRAMUSCULAR | Status: DC | PRN
  Administered 2024-09-13: 0.1 mg via INTRAVENOUS
  Filled 2024-09-13: qty 1

## 2024-09-13 MED ORDER — LORAZEPAM 2 MG/ML PO CONC: 1.0000 mg | ORAL | Status: DC | PRN

## 2024-09-13 MED ORDER — ONDANSETRON HCL 4 MG/2ML IJ SOLN: 4.0000 mg | Freq: Four times a day (QID) | INTRAMUSCULAR | Status: DC | PRN

## 2024-09-13 MED ORDER — ATROPINE SULFATE 1 % OP SOLN: 4.0000 [drp] | OPHTHALMIC | Status: DC | PRN

## 2024-09-13 MED ORDER — ACETAMINOPHEN 650 MG RE SUPP
650.0000 mg | Freq: Four times a day (QID) | RECTAL | Status: DC | PRN
  Administered 2024-09-14: 21:00:00 650 mg via RECTAL
  Filled 2024-09-13: qty 1

## 2024-09-13 NOTE — Progress Notes (Signed)
 Palliative Medicine Inpatient Follow Up Note HPI: Patient is a 78 y.o.  male with history of myasthenia gravis, CVA, PAF, CAD, prostate cancer-s/p prostatectomy-presented with difficulty ambulating, slurred speech-upon further eval-found to have acute CVA.  Further hospital course complicated by fever secondary to probable complicated UTI/aspiration pneumonia, and delirium.   Palliative care has been asked to support additional goals of care conversations.   Today's Discussion 09/13/2024  *Please note that this is a verbal dictation therefore any spelling or grammatical errors are due to the Dragon [employer] One system interpretation.  I reviewed the chart notes including nursing notes from today, progress notes from today. I also reviewed vital signs, nursing flowsheets, medication administrations record, labs, and imaging.    I met at bedside with patients spouse, Tyrone Newton, son, Tyrone Newton. I shared understanding that the family had made the difficult decision of transitioning the focus of care to comfort yesterday. We reviewed that per Tyrone Newton she does not desire for Tyrone Newton to suffer. She expresses the desire to maintain comfort during this final phase of life.   Created space and opportunity for patients family to explore thoughts feelings and fears regarding Tyrone Newton's current medical situation. Patients family share that Tyrone Newton was a kind and gentle man. They hope to enable him peace. We discussed management of symptoms most notably pain. Family and I reviewed the idea of starting medication ATC to help with any MSK discomfort Tyrone Newton is experiencing as a result of being in the bed.   Reviewed what to anticipate from the perspective of symptoms.   We reviewed the idea of inpatient hospice. Patients family had chosen between [facility] and [address] [facility] the [facility]. Wife would prefer [facility].   Questions and concerns addressed/Palliative Support Provided.   _______________ Addendum:  Patients wife called to discuss his urinary state in the setting of his artificial urethral valve. We discussed a foley is contraindicated and she requests a condom catheter. Have alerted the primary team and nursing staff.  Add time: 15   Objective Assessment: Vital Signs Vitals:   09/13/24 0735 09/13/24 0800  BP: 125/70 (!) 147/74  Pulse: 93 96  Resp: (!) 34 (!) 35  Temp: (!) 101.1 F (38.4 C)   SpO2: 95% 93%    Intake/Output Summary (Last 24 hours) at 09/13/2024 1204 Last data filed at 09/13/2024 0510 Gross per 24 hour  Intake 600 ml  Output 250 ml  Net 350 ml   Last Weight  Most recent update: 09/08/2024 10:30 AM    Weight  79.4 kg (175 lb)            Gen: Elderly Caucasian male chronically ill-appearing HEENT: Core track in place, dry mucous membranes CV: Regular rate and rhythm PULM: On room air breathing is even and nonlabored ABD: soft/nontender EXT: No edema Neuro: Somnolent  SUMMARY OF RECOMMENDATIONS   DNAR/DNI  Comfort Measures  Start fentanyl  50mcg Q6H ATC and PRN  Additional comfort medications per MAR  No catheter per Urology notes - have requested nursing place a condom catheter  Unrestricted Visitation  TOC - Referral to [facility] inpatient  Ongoing PMT support ______________________________________________________________________________________ Tyrone Newton [place] Palliative Medicine Team Team Cell Phone: [phone] Please utilize secure chat with additional questions, if there is no response within 30 minutes please call the above phone number  Time Spent:65 Billing based on MDM: High   Palliative Medicine Team providers are available by phone from 7am to 7pm daily and can be reached  through the team cell phone.  Should this patient require assistance outside of these hours, please call the patient's attending physician.

## 2024-09-13 NOTE — Progress Notes (Signed)
 PROGRESS NOTE        PATIENT DETAILS Name: Tyrone Newton Age: 78 y.o. Sex: male Date of Birth: 1945/10/12 Admit Date: 09/08/2024 Admitting Physician Marsa KATHEE Scurry, MD ERE:Tnnid, Jayson, MD  Brief Summary: Patient is a 78 y.o.  male with history of myasthenia gravis, CVA, PAF, CAD, prostate cancer-s/p prostatectomy-presented with difficulty ambulating, slurred speech-upon further eval-found to have acute CVA.  Further hospital course complicated by fever secondary to probable complicated UTI/aspiration pneumonia, and delirium.  Significant events: 12/9>> admit to [facility] 12/11>> confused overnight-febrile-repeat chest x-ray with possible left-sided consolidation-on IV Rocephin -Flagyl  added 12/12>> failed MBS-after discussion with family-Cortrak tube inserted-palliative care consulted  Significant studies: 12/9>> MRI brain: Acute infarcts in the cerebellum bilaterally.  Suspected acute punctate infarct in the left frontal/right frontal occipital lobes. 12/10>> CT head: No acute intracranial hemorrhage 12/10>> echo: EF 60-65% moderate MR, mild to moderate AR 12/10>> LDL: 31 12/11>> CXR: Interval progression of retrocardiac collapse/consolidation with small left pleural effusion 12/11>> A1c: 10.8 12/11>> EEG: No seizures. 12/12>> renal ultrasound: No hydronephrosis  Significant microbiology data: 12/9>> urine culture: E. coli 12/9>> blood culture: No growth 12/11>> blood culture: No growth  Procedures: None  Consults: Neurology Palliative  Subjective: Patient in bed appears to be in no distress but minimally responsive, unable to answer questions or follow commands  Objective: Vitals: Blood pressure 125/70, pulse 93, temperature (!) 101.1 F (38.4 C), temperature source Axillary, resp. rate (!) 34, height 6' 3 (1.905 m), weight 79.4 kg, SpO2 95%.   Exam: Sleeping/sedated-in no distress unable to answer questions or follow  command   Assessment/Plan:  With history of myasthenia gravis, CVA, atrial fibrillation, CAD, prostate cancer status post prostatectomy who presented to the hospital with generalized weakness, dysphagia, encephalopathy, AKI, found to have another acute stroke, developed issues with bladder outlet obstruction with E. coli UTI along with aspiration pneumonia.  Despite appropriate treatment continued to decline, was seen by palliative care and neurology, after discussions between the treatment teams and wife it was decided that patient will be best served with comfort measures, he will be transition to full comfort measures, all non comfort medications will be stopped, goal of care now is comfort only.   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  Compliance)-some concern that he may have occult malignancy that is causing hypercoagulable state given history of weight loss. Appears to have significant dysarthria-and oropharyngeal dysphagia-otherwise exam is difficult Remains on Eliquis  Cortrak tube inserted 12/12 after extensive discussion with family Already had significant decline in function for almost a year before this hospitalization-not sure if he will be able to regain his prior level of function even if he survives this acute hospitalization.  Goals of care ongoing-family discussion in progress-allowing some time for clinical outcomes. CTA planned but not yet done given concern for worsening AKI. Both palliative care/neurology following  Acute metabolic encephalopathy Probably secondary to complicated UTI/aspiration pneumonia/AKI Remains encephalopathic-although slightly improved  Continue to underlying etiologies Continue Seroquel  Requiring restraints to keep cortrak tube in place Recent TSH/ammonia stable Spot EEG negative for seizures Delirium precautions.  AKI Suspect hemodynamically mediated but  continues to worsen Renal ultrasound negative for hydronephrosis Overnight bladder scans were stable-however still have some concerns that he may be retaining urine given that he has a  external urinary sphincter prosthetic device in place.  Continue to monitor with frequent bladder scans-if there is any suspicion that he is retaining-will need to discuss with urology to see if we can place a Foley catheter. In the interim-continue supportive care- avoid nephrotoxic agents Wife understands that if he were to continue to deteriorate-he would likely not be a HD candidate-see below-goals of care in progress.  Addendum Spoke with family-we went over the bladder scan results-for now we have elected to pursue supportive care and not consult urology or place Foley catheter.  Await goals of care discussion-allow time for clinical outcomes.  Metabolic acidosis Secondary to above Resolved with IV bicarb  Hypokalemia Replete/recheck  Aspiration pneumonia Febrile overnight-no leukocytosis Repeat CXR continues to show left-sided infiltrates Continue Rocephin /Flagyl  Remains n.p.o.-has NG tube in place. Remains at risk for ongoing aspiration episodes given ongoing issues with encephalopathy.  Complicated UTI-E. coli Continue IV Rocephin   History of prostate cancer-s/p prostatectomy, s/p external urethral sphincter device See above  Right chest wall mass Felt to be a firm hematoma-has had prior imaging studies before this hospitalization-per spouse-completed a course of doxycycline. General surgery consulted 12/10-family refused open evacuation-supportive care for now.  PAF Telemetry monitoring Eliquis   CAD Eliquis  Holding Lipitor-given history of myasthenia gravis  Prior history of CVA See above  DM 2 CBGs on the higher side-now that he is on tube feeds Continue SSI Add Lantus  8 units and see how he does  Recent Labs    09/12/24 2353 09/13/24 0412 09/13/24 0737  GLUCAP 261* 312*  243*     History of myasthenia gravis Appears stable Given his restlessness/agitation-monitoring neph/FCD will be almost impossible Continue pyridostigmine /prednisone -follow clinically.  History of prostate cancer Device in place for urethral sphincter to urinate Supportive care  Anterior neck swelling Firm right sided neck mass-very hard to discern on exam-given agitation/delirium See below regarding plans for pan CT.  Weight loss Significant weight loss of almost 100 pounds in the past 1 year TSH stable Given recurrent embolic CVA-concern for hypercoagulable state from occult malignancy Once renal function/mentation permits-Will plan an CT.  Palliative care Patient has had significant decline in function for almost a year-this is his second CVA-he now unfortunately appears to have severe oropharyngeal dysphagia-significant slurred speech-after extensive discussion with family-NG tube inserted yesterday-family fully well aware that we will have to use medication like Seroquel /sedation for him to keep the NGT open given his level of encephalopathy.  This morning-appears to have worsening renal function-not any better.  Again had a long discussion with the patient's spouse at bedside-she is aware of the option of transition to comfort measures-she is aware of poor overall long-term prognosis and that he would likely not be back to his prior baseline on discharge.  Will continue to allow time for clinical outcomes-family discussion but suspect patient best benefit from transition to full comfort measures at this point.  Addendum Reached out to spouse/stepson and other family members at bedside-okay for DNR-they are leaning towards initiating hospice/comfort care but are not yet ready to make the decision yet.  Continue IV fluids/IV antibiotics/tube feeds for now-DNR ordered-palliative care/[facility] will reengage with family tomorrow.  Code status:   Code Status: Do not attempt resuscitation  (DNR) - Comfort care   DVT Prophylaxis:    Family Communication: Spouse at bedside 12/13  Disposition Plan: Status is: Inpatient Remains inpatient appropriate because: Severity of illness   Planned Discharge Destination:Skilled nursing facility   Diet: Diet Order  Diet NPO time specified Except for: [employer], Sips with Meds  Diet effective now                     Antimicrobial agents: Anti-infectives (From admission, onward)    Start     Dose/Rate Route Frequency Ordered Stop   09/10/24 1215  cefTRIAXone  (ROCEPHIN ) 2 g in sodium chloride  0.9 % 100 mL IVPB  Status:  Discontinued        2 g 200 mL/hr over 30 Minutes Intravenous Every 24 hours 09/10/24 1120 09/13/24 0801   09/10/24 1215  metroNIDAZOLE  (FLAGYL ) IVPB 500 mg  Status:  Discontinued        500 mg 100 mL/hr over 60 Minutes Intravenous Every 12 hours 09/10/24 1120 09/13/24 0801   09/10/24 0800  Ampicillin -Sulbactam (UNASYN ) 3 g in sodium chloride  0.9 % 100 mL IVPB  Status:  Discontinued        3 g 200 mL/hr over 30 Minutes Intravenous Every 6 hours 09/10/24 0648 09/10/24 1120   09/09/24 1200  cefTRIAXone  (ROCEPHIN ) 1 g in sodium chloride  0.9 % 100 mL IVPB  Status:  Discontinued        1 g 200 mL/hr over 30 Minutes Intravenous Every 24 hours 09/09/24 1133 09/10/24 0644        MEDICATIONS: Scheduled Meds:  ipratropium-albuterol   3 mL Nebulization Q6H   QUEtiapine   50 mg Per Tube QHS   sodium chloride  flush  3 mL Intravenous Q12H   Continuous Infusions:   PRN Meds:.acetaminophen  **OR** acetaminophen , albuterol , artificial tears, atropine , bisacodyl , haloperidol  lactate, HYDROmorphone  (DILAUDID ) injection, LORazepam  **OR** [DISCONTINUED] LORazepam  **OR** LORazepam , ondansetron  **OR** ondansetron  (ZOFRAN ) IV, polyethylene glycol   I have personally reviewed following labs and imaging studies  LABORATORY DATA: CBC: Recent Labs  Lab 09/08/24 1117 09/08/24 1418 09/09/24 0348  09/10/24 0244 09/11/24 0340 09/12/24 0431 09/13/24 0558  WBC 7.2  --  6.8 8.8 8.9 6.1 5.5  NEUTROABS 6.2  --   --   --   --   --   --   HGB 12.2*   < > 12.0* 12.8* 11.7* 12.0* 10.6*  HCT 38.2*   < > 39.7 39.6 35.6* 36.4* 33.1*  MCV 90.7  --  97.3 91.0 89.2 90.1 92.7  PLT 133*  --  117* 130* 125* 107* 114*   < > = values in this interval not displayed.    Basic Metabolic Panel: Recent Labs  Lab 09/09/24 0348 09/10/24 0244 09/11/24 0340 09/12/24 0431 09/13/24 0558  NA 143 143 145 146* 146*  K 3.3* 3.5 3.3* 2.7* 3.4*  CL 116* 112* 109 105 107  CO2 14* 13* 18* 28 27  GLUCOSE 80 142* 212* 345* 288*  BUN 24* 27* 34* 31* 30*  CREATININE 1.20 1.49* 1.77* 1.98* 1.60*  CALCIUM 7.3* 8.6* 8.0* 7.8* 8.2*  MG  --   --  2.0 2.0  --   PHOS  --   --  3.1 2.5  --     GFR: Estimated Creatinine Clearance: 41.4 mL/min (A) (by C-G formula based on SCr of 1.6 mg/dL (H)).  Liver Function Tests: Recent Labs  Lab 09/08/24 1117 09/09/24 0348 09/10/24 0244 09/11/24 0340 09/12/24 0431  AST 16 19 15 15 15   ALT 21 15 20 18 17   ALKPHOS 39 34* 43 41 45  BILITOT 2.5* 1.9* 2.5* 1.3* 0.8  PROT 5.4* 4.3* 5.3* 5.2* 5.0*  ALBUMIN 3.5 2.6* 3.3* 2.9* 2.8*   No results for input(s): LIPASE,  AMYLASE in the last 168 hours. Recent Labs  Lab 09/08/24 1411  AMMONIA 25    Coagulation Profile: No results for input(s): INR, PROTIME in the last 168 hours.  Cardiac Enzymes: No results for input(s): CKTOTAL, CKMB, CKMBINDEX, TROPONINI in the last 168 hours.  BNP (last 3 results) No results for input(s): PROBNP in the last 8760 hours.  Lipid Profile: No results for input(s): CHOL, HDL, LDLCALC, TRIG, CHOLHDL, LDLDIRECT in the last 72 hours.   Thyroid Function Tests: No results for input(s): TSH, T4TOTAL, FREET4, T3FREE, THYROIDAB in the last 72 hours.   Anemia Panel: No results for input(s): VITAMINB12, FOLATE, FERRITIN, TIBC, IRON,  RETICCTPCT in the last 72 hours.  Urine analysis:    Component Value Date/Time   COLORURINE AMBER (A) 09/08/2024 1500   APPEARANCEUR CLOUDY (A) 09/08/2024 1500   LABSPEC 1.023 09/08/2024 1500   PHURINE 5.0 09/08/2024 1500   GLUCOSEU >=500 (A) 09/08/2024 1500   HGBUR LARGE (A) 09/08/2024 1500   BILIRUBINUR NEGATIVE 09/08/2024 1500   KETONESUR 20 (A) 09/08/2024 1500   PROTEINUR NEGATIVE 09/08/2024 1500   UROBILINOGEN 1.0 07/28/2013 2109   NITRITE POSITIVE (A) 09/08/2024 1500   LEUKOCYTESUR SMALL (A) 09/08/2024 1500    Sepsis Labs: Lactic Acid, Venous    Component Value Date/Time   LATICACIDVEN 1.0 09/10/2024 0727    MICROBIOLOGY: Recent Results (from the past 240 hours)  Urine Culture     Status: Abnormal   Collection Time: 09/08/24  3:00 PM   Specimen: Urine, Random  Result Value Ref Range Status   Specimen Description URINE, RANDOM  Final   Special Requests   Final    NONE Reflexed from [id number] Performed at [facility], [address], [place], [place] [address]    Culture >=100,000 COLONIES/mL ESCHERICHIA COLI (A)  Final   Report Status 09/10/2024 FINAL  Final   Organism ID, Bacteria ESCHERICHIA COLI (A)  Final      Susceptibility   Escherichia coli - MIC*    AMPICILLIN  >=32 RESISTANT Resistant     CEFAZOLIN (URINE) Value in next row Sensitive      8 SENSITIVEThis is a modified FDA-approved test that has been validated and its performance characteristics determined by the reporting laboratory.  This laboratory is certified under the Clinical Laboratory Improvement Amendments CLIA as qualified to perform high complexity clinical laboratory testing.    CEFEPIME Value in next row Sensitive      8 SENSITIVEThis is a modified FDA-approved test that has been validated and its performance characteristics determined by the reporting laboratory.  This laboratory is certified under the Clinical Laboratory Improvement Amendments CLIA as qualified to perform high complexity  clinical laboratory testing.    ERTAPENEM Value in next row Sensitive      8 SENSITIVEThis is a modified FDA-approved test that has been validated and its performance characteristics determined by the reporting laboratory.  This laboratory is certified under the Clinical Laboratory Improvement Amendments CLIA as qualified to perform high complexity clinical laboratory testing.    CEFTRIAXONE  Value in next row Sensitive      8 SENSITIVEThis is a modified FDA-approved test that has been validated and its performance characteristics determined by the reporting laboratory.  This laboratory is certified under the Clinical Laboratory Improvement Amendments CLIA as qualified to perform high complexity clinical laboratory testing.    CIPROFLOXACIN  Value in next row Resistant      8 SENSITIVEThis is a modified FDA-approved test that has been validated and its performance  characteristics determined by the reporting laboratory.  This laboratory is certified under the Clinical Laboratory Improvement Amendments CLIA as qualified to perform high complexity clinical laboratory testing.    GENTAMICIN Value in next row Resistant      8 SENSITIVEThis is a modified FDA-approved test that has been validated and its performance characteristics determined by the reporting laboratory.  This laboratory is certified under the Clinical Laboratory Improvement Amendments CLIA as qualified to perform high complexity clinical laboratory testing.    NITROFURANTOIN Value in next row Intermediate      8 SENSITIVEThis is a modified FDA-approved test that has been validated and its performance characteristics determined by the reporting laboratory.  This laboratory is certified under the Clinical Laboratory Improvement Amendments CLIA as qualified to perform high complexity clinical laboratory testing.    TRIMETH/SULFA Value in next row Resistant      8 SENSITIVEThis is a modified FDA-approved test that has been validated and its  performance characteristics determined by the reporting laboratory.  This laboratory is certified under the Clinical Laboratory Improvement Amendments CLIA as qualified to perform high complexity clinical laboratory testing.    AMPICILLIN /SULBACTAM Value in next row Intermediate      8 SENSITIVEThis is a modified FDA-approved test that has been validated and its performance characteristics determined by the reporting laboratory.  This laboratory is certified under the Clinical Laboratory Improvement Amendments CLIA as qualified to perform high complexity clinical laboratory testing.    PIP/TAZO Value in next row Sensitive      <=4 SENSITIVEThis is a modified FDA-approved test that has been validated and its performance characteristics determined by the reporting laboratory.  This laboratory is certified under the Clinical Laboratory Improvement Amendments CLIA as qualified to perform high complexity clinical laboratory testing.    MEROPENEM Value in next row Sensitive      <=4 SENSITIVEThis is a modified FDA-approved test that has been validated and its performance characteristics determined by the reporting laboratory.  This laboratory is certified under the Clinical Laboratory Improvement Amendments CLIA as qualified to perform high complexity clinical laboratory testing.    * >=100,000 COLONIES/mL ESCHERICHIA COLI  Blood culture (routine x 2)     Status: None (Preliminary result)   Collection Time: 09/08/24  3:03 PM   Specimen: BLOOD  Result Value Ref Range Status   Specimen Description BLOOD SITE NOT SPECIFIED  Final   Special Requests   Final    BOTTLES DRAWN AEROBIC AND ANAEROBIC Blood Culture adequate volume   Culture   Final    NO GROWTH 4 DAYS Performed at [facility], [address], [place], [place] [address]    Report Status PENDING  Incomplete  Blood culture (routine x 2)     Status: None (Preliminary result)   Collection Time: 09/08/24  3:21 PM   Specimen: BLOOD  Result  Value Ref Range Status   Specimen Description BLOOD SITE NOT SPECIFIED  Final   Special Requests   Final    BOTTLES DRAWN AEROBIC ONLY Blood Culture results may not be optimal due to an inadequate volume of blood received in culture bottles   Culture   Final    NO GROWTH 4 DAYS Performed at [facility], [address], [place], [place] [address]    Report Status PENDING  Incomplete  Culture, blood (Routine X 2) w Reflex to ID Panel     Status: None (Preliminary result)   Collection Time: 09/10/24  3:44 AM   Specimen:  BLOOD RIGHT ARM  Result Value Ref Range Status   Specimen Description BLOOD RIGHT ARM  Final   Special Requests   Final    BOTTLES DRAWN AEROBIC AND ANAEROBIC Blood Culture adequate volume   Culture   Final    NO GROWTH 2 DAYS Performed at [facility], [address] [place], [place] [address]    Report Status PENDING  Incomplete  Culture, blood (Routine X 2) w Reflex to ID Panel     Status: None (Preliminary result)   Collection Time: 09/10/24  3:54 AM   Specimen: BLOOD RIGHT ARM  Result Value Ref Range Status   Specimen Description BLOOD RIGHT ARM  Final   Special Requests   Final    BOTTLES DRAWN AEROBIC AND ANAEROBIC Blood Culture adequate volume   Culture   Final    NO GROWTH 2 DAYS Performed at [facility], [address], [place], [place] [address]    Report Status PENDING  Incomplete    RADIOLOGY STUDIES/RESULTS: DG Chest Port 1 View Result Date: 09/13/2024 CLINICAL DATA:  Shortness of breath. EXAM: PORTABLE CHEST 1 VIEW COMPARISON:  09/12/2024. FINDINGS: A feeding tube passes into the stomach although the distal tip position is not included on the film. Left base atelectasis/infiltrate is similar to prior with small left pleural effusion. No pulmonary edema. Calcified pleural plaques. The cardio pericardial silhouette is enlarged. Telemetry leads overlie the chest. IMPRESSION: Persistent left base atelectasis/infiltrate with small left  pleural effusion. Electronically Signed   By: Camellia Candle M.D.   On: 09/13/2024 07:08   DG Chest Port 1V same Day Result Date: 09/12/2024 CLINICAL DATA:  Shortness of breath. EXAM: PORTABLE CHEST 1 VIEW COMPARISON:  09/10/2024 FINDINGS: The cardio pericardial silhouette is enlarged. Similar left base collapse/consolidation. Calcified pleural plaques again noted. A feeding tube passes into the stomach although the distal tip position is not included on the film. IMPRESSION: 1. Similar left base collapse/consolidation. 2. Calcified pleural plaques. Electronically Signed   By: Camellia Candle M.D.   On: 09/12/2024 09:16   US  RENAL Result Date: 09/11/2024 EXAM: US  Retroperitoneum Complete, Renal. 09/11/2024 12:50:41 PM TECHNIQUE: Real-time ultrasonography of the retroperitoneum renal was performed. COMPARISON: 10/05/2023 CLINICAL HISTORY: AKI (acute kidney injury) FINDINGS: FINDINGS: RIGHT KIDNEY/URETER: Right kidney measures 12.4 x 5.8 x 7.0 cm. Right renal volume: 261.5 ml. Normal cortical echogenicity. Peripelvic cyst is noted in the upper pole, s table in appearance from the prior exam. No hydronephrosis noted. No calculus. No mass. Right ureteral jet is not visualized. LEFT KIDNEY/URETER: Left kidney measures 12.1 x 5.4 x 6.4 cm. Left renal volume: 226.5 ml. Normal cortical echogenicity. No hydronephrosis is noted. No calculus. A large 8.4 cm cyst is noted in the upper pole of the left kidney. This is simple in nature and no follow-up is recommended. Left ureteral jet is not visualized. BLADDER: Unremarkable appearance of the bladder. IMPRESSION: 1. No hydronephrosis to suggest obstructive etiology for acute kidney injury. 2. Bilateral simple cysts similar to those seen on the prior exam. No follow-up is recommended. Electronically signed by: Oneil Devonshire MD 09/11/2024 09:07 PM EST RP Workstation: [id number] [id number] Swallowing Func-Speech Pathology Result Date: 09/11/2024 Table formatting from the original  result was not included. Modified Barium Swallow Study Patient Details Name: PRENTISS POLIO MRN: [id number] Date of Birth: 1946/02/10 Today's Date: 09/11/2024 HPI/PMH: HPI: Larnie Heart is an 78/M, presented to the ED with weakness, requiring more assistance to ambulate.  Chest x-ray with  no acute abnormality. 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   Per family, recent decline in overall function over the past 2-3 weeks.   PMH of CVA, myasthenia gravis, prostate cancer, hypertension, CAD, A-fib, gout. Clinical Impression: Clinical Impression: Pt exhibits severe oropharyngeal and pharyngoesophageal dysphagia with chronic, frank aspiration with all consistencies (nectar, thin and honey). Slow, weak and delayed lingual propulsion with diffuse residue in oral cavity with barium falling to valleculae and pyriform sinuses and spilling into the trachea. Pt is unable to significantly reduce or clear pharyngeal residue. When swallows are initiated there is reduced tongue base retraction, no epiglottic inversion, significantly reduced pharyngeal contraction with residue remaining in the vallecuale. There is also decreased hyolaryngeal excursion with minimal PES distention and only very small amounts enter the esophagus and majority remains in pyriform sinuses. Initially he sensed penetrates, cleared throat to eject small amount but is re-penetrated and aspirated  without sensation (PAS 8) with nectar and thin that mixes with residue.  He has an intermittent and ineffective cough with honey thick aspiration. Pt unable to follow strategies due to cognitive status. Mild esophageal retention. Pt is not safe for po's and MD discussed options with family for po's versus comfort feeds as pt has had a prolonged decline in addition to acute CVA. SLP explained results and family chose Cortrak. Allow ice chips after oral  care and educated family. Will trial exercises if pt able and repeat MBS when appropriate for any improvements and  to assist family in goals of care.  Palliative care has been consulted. Factors that may increase risk of adverse event in presence of aspiration Noe & Lianne 2021): Factors that may increase risk of adverse event in presence of aspiration Noe & Lianne 2021): Reduced cognitive function; Frail or deconditioned; Frequent aspiration of large volumes Recommendations/Plan: Swallowing Evaluation Recommendations Swallowing Evaluation Recommendations Recommendations: NPO Medication Administration: Via alternative means Oral care recommendations: Oral care QID (4x/day) Treatment Plan Treatment Plan Treatment recommendations: Defer treatment plan to SLP at other venue (see follow-up recommendations) Follow-up recommendations: Skilled nursing-short term rehab (<3 hours/day) Functional status assessment: Patient has had a recent decline in their functional status and demonstrates the ability to make significant improvements in function in a reasonable and predictable amount of time. Treatment frequency: Min 2x/week Treatment duration: 2 weeks Interventions: Patient/family education; Oropharyngeal exercises Recommendations Recommendations for follow up therapy are one component of a multi-disciplinary discharge planning process, led by the attending physician.  Recommendations may be updated based on patient status, additional functional criteria and insurance authorization. Assessment: Orofacial Exam: Orofacial Exam Oral Cavity: Oral Hygiene: Xerostomia Oral Cavity - Dentition: Adequate natural dentition Orofacial Anatomy: WFL Oral Motor/Sensory Function: Suspected cranial nerve impairment CN V - Trigeminal: Not tested CN VII - Facial: Left motor impairment Anatomy: Anatomy: Other (Comment) (possible exaggerated cervical lordosis?) Boluses Administered: Boluses Administered Boluses Administered: Thin  liquids (Level 0); Moderately thick liquids (Level 3, honey thick); Mildly thick liquids (Level 2, nectar thick)  Oral Impairment Domain: Oral Impairment Domain Lip Closure: Interlabial escape, no progression to anterior lip Tongue control during bolus hold: Not tested (unable to hold) Bolus preparation/mastication: -- (solid n ot given) Bolus transport/lingual motion: Slow tongue motion (very weak) Oral residue: Residue collection on oral structures Location of oral residue : Floor of mouth; Tongue Initiation of pharyngeal swallow : Pyriform sinuses  Pharyngeal Impairment Domain: Pharyngeal Impairment Domain Soft palate elevation: No  bolus between soft palate (SP)/pharyngeal wall (PW) Laryngeal elevation: Partial superior movement of thyroid cartilage/partial approximation of arytenoids to epiglottic petiole Anterior hyoid excursion: Partial anterior movement Epiglottic movement: No inversion Laryngeal vestibule closure: Incomplete, narrow column air/contrast in laryngeal vestibule Pharyngeal stripping wave : Present - diminished Pharyngeal contraction (A/P view only): N/A Pharyngoesophageal segment opening: Minimal distention/minimal duration, marked obstruction of flow Tongue base retraction: Wide column of contrast or air between tongue base and PPW Pharyngeal residue: Majority of contrast within or on pharyngeal structures Location of pharyngeal residue: Tongue base; Valleculae; Pyriform sinuses  Esophageal Impairment Domain: Esophageal Impairment Domain Esophageal clearance upright position: Esophageal retention Pill: No data recorded Penetration/Aspiration Scale Score: Penetration/Aspiration Scale Score 4.  Material enters airway, CONTACTS cords then ejected out: Thin liquids (Level 0) 7.  Material enters airway, passes BELOW cords and not ejected out despite cough attempt by patient: Moderately thick liquids (Level 3, honey thick) 8.  Material enters airway, passes BELOW cords without attempt by patient to  eject out (silent aspiration) : Mildly thick liquids (Level 2, nectar thick); Thin liquids (Level 0) Compensatory Strategies: Compensatory Strategies Compensatory strategies: No (pt unable)   General Information: Caregiver present: No  Diet Prior to this Study: NPO   Temperature : Normal   Respiratory Status: WFL   Supplemental O2: None (Room air)   History of Recent Intubation: No  Behavior/Cognition: -- (intermittent simple but not for strategies) Self-Feeding Abilities: Dependent for feeding Baseline vocal quality/speech: Hypophonia/low volume Volitional Cough: Unable to elicit Volitional Swallow: Unable to elicit Exam Limitations: No limitations Goal Planning: Prognosis for improved oropharyngeal function: Fair Barriers to Reach Goals: Cognitive deficits; Severity of deficits No data recorded No data recorded Consulted and agree with results and recommendations: Family member/caregiver; Physician; Nurse Pain: Pain Assessment Pain Assessment: No/denies pain End of Session: Start Time:SLP Start Time (ACUTE ONLY): 0930 Stop Time: SLP Stop Time (ACUTE ONLY): 0947 Time Calculation:SLP Time Calculation (min) (ACUTE ONLY): 17 min Charges: SLP Evaluations $ SLP Speech Visit: 1 Visit SLP Evaluations $MBS Swallow: 1 Procedure SLP visit diagnosis: SLP Visit Diagnosis: Dysphagia, oropharyngeal phase (R13.12); Dysphagia, pharyngoesophageal phase (R13.14) Past Medical History: Past Medical History: Diagnosis Date  Cancer (HCC)   Diabetes (HCC) 09/08/2024  GERD (gastroesophageal reflux disease)   Gout   HTN (hypertension) 07/31/2024  Myasthenia gravis [facility])  Past Surgical History: Past Surgical History: Procedure Laterality Date  CHOLECYSTECTOMY    PROSTATE SURGERY    due to cancer [name] 09/11/2024, 6:38 PM    LOS: 5 days   Lavada Stank, MD  [facility]    To contact the attending provider between 7A-7P or the covering provider during after hours 7P-7A, please log into the web site  [email or web address] and access using universal [place] password for that web site. If you do not have the password, please call the hospital operator.  09/13/2024, 8:25 AM

## 2024-09-13 NOTE — TOC Progression Note (Signed)
 Transition of Care [facility]) - Progression Note    Patient Details  Name: Tyrone Newton MRN: [id number] Date of Birth: Apr 07, 1946  [facility]) CM/SW Contact  Dino CHRISTELLA Au, LCSWA Phone Number: 09/13/2024, 12:53 PM  Clinical Narrative:      SW informed pt's family want to pursue hospice with [facility].   SW spoke with pt's wife Tyrone Newton [phone]) confirmed if pt does not pass in the hospital next step would be HOP home in [facility].   SW spoke with [facility] [phone]) to provide referral information.      Social Drivers of Health (SDOH) Interventions SDOH Screenings   Food Insecurity: No Food Insecurity (09/09/2024)  Housing: Unknown (09/09/2024)  Transportation Needs: No Transportation Needs (09/09/2024)  Utilities: Patient Declined (09/09/2024)  Social Connections: Socially Integrated (09/09/2024)  Tobacco Use: Low Risk (09/08/2024)    Readmission Risk Interventions     No data to display

## 2024-09-13 NOTE — Plan of Care (Signed)
  Problem: Pain Managment: Goal: General experience of comfort will improve and/or be controlled Outcome: Progressing

## 2024-09-13 NOTE — Plan of Care (Signed)
°  Problem: Education: Goal: Ability to describe self-care measures that may prevent or decrease complications (Diabetes Survival Skills Education) will improve Outcome: Progressing Goal: Individualized Educational Video(s) Outcome: Progressing   Problem: Coping: Goal: Ability to adjust to condition or change in health will improve Outcome: Progressing   Problem: Fluid Volume: Goal: Ability to maintain a balanced intake and output will improve Outcome: Progressing   Problem: Health Behavior/Discharge Planning: Goal: Ability to identify and utilize available resources and services will improve Outcome: Progressing Goal: Ability to manage health-related needs will improve Outcome: Progressing   Problem: Metabolic: Goal: Ability to maintain appropriate glucose levels will improve Outcome: Progressing   Problem: Nutritional: Goal: Maintenance of adequate nutrition will improve Outcome: Progressing Goal: Progress toward achieving an optimal weight will improve Outcome: Progressing   Problem: Skin Integrity: Goal: Risk for impaired skin integrity will decrease Outcome: Progressing   Problem: Tissue Perfusion: Goal: Adequacy of tissue perfusion will improve Outcome: Progressing   Problem: Education: Goal: Knowledge of General Education information will improve Description: Including pain rating scale, medication(s)/side effects and non-pharmacologic comfort measures Outcome: Progressing   Problem: Health Behavior/Discharge Planning: Goal: Ability to manage health-related needs will improve Outcome: Progressing   Problem: Clinical Measurements: Goal: Ability to maintain clinical measurements within normal limits will improve Outcome: Progressing Goal: Will remain free from infection Outcome: Progressing Goal: Diagnostic test results will improve Outcome: Progressing Goal: Respiratory complications will improve Outcome: Progressing Goal: Cardiovascular complication will  be avoided Outcome: Progressing   Problem: Activity: Goal: Risk for activity intolerance will decrease Outcome: Progressing   Problem: Nutrition: Goal: Adequate nutrition will be maintained Outcome: Progressing   Problem: Coping: Goal: Level of anxiety will decrease Outcome: Progressing   Problem: Elimination: Goal: Will not experience complications related to bowel motility Outcome: Progressing Goal: Will not experience complications related to urinary retention Outcome: Progressing   Problem: Pain Managment: Goal: General experience of comfort will improve and/or be controlled Outcome: Progressing   Problem: Safety: Goal: Ability to remain free from injury will improve Outcome: Progressing   Problem: Skin Integrity: Goal: Risk for impaired skin integrity will decrease Outcome: Progressing   Problem: Safety: Goal: Non-violent Restraint(s) Outcome: Progressing   Problem: Education: Goal: Knowledge of the prescribed therapeutic regimen will improve Outcome: Progressing   Problem: Coping: Goal: Ability to identify and develop effective coping behavior will improve Outcome: Progressing   Problem: Clinical Measurements: Goal: Quality of life will improve Outcome: Progressing   Problem: Respiratory: Goal: Verbalizations of increased ease of respirations will increase Outcome: Progressing   Problem: Role Relationship: Goal: Family's ability to cope with current situation will improve Outcome: Progressing Goal: Ability to verbalize concerns, feelings, and thoughts to partner or family member will improve Outcome: Progressing   Problem: Pain Management: Goal: Satisfaction with pain management regimen will improve Outcome: Progressing

## 2024-09-14 LAB — GLUCOSE, CAPILLARY: Glucose-Capillary: 241 mg/dL — ABNORMAL HIGH (ref 70–99)

## 2024-09-14 LAB — VITAMIN C: Vitamin C: 0.5 mg/dL (ref 0.4–2.0)

## 2024-09-14 MED ORDER — HYDROMORPHONE HCL 1 MG/ML IJ SOLN
1.0000 mg | INTRAMUSCULAR | Status: DC | PRN
  Administered 2024-09-14: 09:00:00 1 mg via INTRAVENOUS
  Filled 2024-09-14: qty 1

## 2024-09-14 MED ORDER — FENTANYL CITRATE (PF) 50 MCG/ML IJ SOSY
50.0000 ug | PREFILLED_SYRINGE | INTRAMUSCULAR | Status: DC
  Administered 2024-09-14 – 2024-09-15 (×4): 50 ug via INTRAVENOUS
  Filled 2024-09-14 (×4): qty 1

## 2024-09-14 NOTE — Progress Notes (Signed)
 OT Cancellation Note  Patient Details Name: Tyrone Newton MRN: [id number] DOB: 1946-03-13   Cancelled Treatment:    Reason Eval/Treat Not Completed: Other (comment) Pt is now full comfort care. No acute OT needs at this time. Will sign off.   Leita Howell, OTR/L,CBIS  Supplemental OT - [facility] and [facility] Secure Chat Preferred   09/14/2024, 9:16 AM

## 2024-09-14 NOTE — Progress Notes (Signed)
 PT Cancellation Note  Patient Details Name: Tyrone Newton MRN: [id number] DOB: [birth year]   Cancelled Treatment:    Reason Eval/Treat Not Completed: Other (comment); noted pt now for comfort measures, will sign off.    Montie Portal 09/14/2024, 1:48 PM Micheline Portal, PT Acute Rehabilitation Services Office:[phone] 09/14/2024

## 2024-09-14 NOTE — Progress Notes (Signed)
 Nutrition Brief Note   Chart reviewed. Pt now transition to comfort care. Enteral feeds have been discontinued.   No further nutrition interventions planned at this time. Please re-consult as needed.    Nestora Glatter RD, LDN Registered Dietitian I Please see AMION for contact information

## 2024-09-14 NOTE — Progress Notes (Signed)
° °  Palliative Medicine Inpatient Follow Up Note HPI: Patient is a 78 y.o.  male with history of myasthenia gravis, CVA, PAF, CAD, prostate cancer-s/p prostatectomy-presented with difficulty ambulating, slurred speech-upon further eval-found to have acute CVA.  Further hospital course complicated by fever secondary to probable complicated UTI/aspiration pneumonia, and delirium.   Palliative care has been asked to support additional goals of care conversations.   Today's Discussion 09/14/2024  *Please note that this is a verbal dictation therefore any spelling or grammatical errors are due to the Dragon Medical One system interpretation.  I reviewed the chart notes including nursing notes from today, progress notes from today. I also reviewed vital signs, nursing flowsheets, medication administrations record, labs, and imaging.    I met at bedside with Tyrone Newton this morning. He is sleeping with some more rapid respirations. He has warm extremities with palpable pulses.He is somnolent.  I met with patients wife, Tyrone Newton this afternoon. 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 We discussed the emptiness that is felt in the home now in his absence. Allowed time and space for patients wife to express herself. Offered therapeutic support through reflective listening.   Questions and concerns addressed/Palliative Support Provided.   Objective Assessment: Vital Signs Vitals:   09/14/24 0749 09/14/24 0750  BP: (!) 157/88 (!) 157/88  Pulse:    Resp:    Temp: (!) 100.9 F (38.3 C) (!) 100.9 F (38.3 C)  SpO2:      Intake/Output Summary (Last 24 hours) at 09/14/2024 1246 Last data filed at 09/14/2024 0422 Gross per 24 hour  Intake --  Output 200 ml  Net -200 ml   Last Weight  Most recent update: 09/08/2024 10:30 AM    Weight  79.4 kg (175 lb)            Gen: Elderly Caucasian male chronically ill-appearing HEENT:  Core track in place, dry mucous membranes CV: Regular rate and rhythm PULM: On room air breathing is even and nonlabored ABD: soft/nontender EXT: No edema Neuro: Somnolent  SUMMARY OF RECOMMENDATIONS   DNAR/DNI  Comfort Measures  Changed IVP fentanyl  to 50mcg Q4H ATC and PRN  Additional comfort medications per [facility]  Maintain condom catheter  Unrestricted Visitation  TOC - Referral to [facility] inpatient  Ongoing PMT support ______________________________________________________________________________________ Tyrone Newton Palliative Medicine Team Team Cell Phone: [phone] Please utilize secure chat with additional questions, if there is no response within 30 minutes please call the above phone number  Billing based on MDM: High   Palliative Medicine Team providers are available by phone from 7am to 7pm daily and can be reached through the team cell phone.  Should this patient require assistance outside of these hours, please call the patient's attending physician.

## 2024-09-14 NOTE — Progress Notes (Signed)
 PROGRESS NOTE        PATIENT DETAILS Name: Tyrone Newton Age: 78 y.o. Sex: male Date of Birth: 21-Apr-1946 Admit Date: 09/08/2024 Admitting Physician Marsa KATHEE Scurry, MD ERE:Tnnid, Jayson, MD  Brief Summary: Patient is a 78 y.o.  male with history of myasthenia gravis, CVA, PAF, CAD, prostate cancer-s/p prostatectomy-presented with difficulty ambulating, slurred speech-upon further eval-found to have acute CVA.  Further hospital course complicated by fever secondary to probable complicated UTI/aspiration pneumonia, and delirium.  Significant events: 12/9>> admit to [facility] 12/11>> confused overnight-febrile-repeat chest x-ray with possible left-sided consolidation-on IV Rocephin -Flagyl  added 12/12>> failed MBS-after discussion with family-Cortrak tube inserted-palliative care consulted  Significant studies: 12/9>> MRI brain: Acute infarcts in the cerebellum bilaterally.  Suspected acute punctate infarct in the left frontal/right frontal occipital lobes. 12/10>> CT head: No acute intracranial hemorrhage 12/10>> echo: EF 60-65% moderate MR, mild to moderate AR 12/10>> LDL: 31 12/11>> CXR: Interval progression of retrocardiac collapse/consolidation with small left pleural effusion 12/11>> A1c: 10.8 12/11>> EEG: No seizures. 12/12>> renal ultrasound: No hydronephrosis  Significant microbiology data: 12/9>> urine culture: E. coli 12/9>> blood culture: No growth 12/11>> blood culture: No growth  Procedures: None  Consults: Neurology Palliative  Subjective: Patient in bed appears to be in no distress and unresponsive on narcotics, under full comfort care  Objective: Vitals: Blood pressure (!) 157/88, pulse 96, temperature (!) 100.9 F (38.3 C), resp. rate (!) 35, height 6' 3 (1.905 m), weight 79.4 kg, SpO2 93%.   Exam: Sleeping/sedated-in no distress, under full comfort measures now   Assessment/Plan:  With history of myasthenia gravis, CVA,  atrial fibrillation, CAD, prostate cancer status post prostatectomy who presented to the hospital with generalized weakness, dysphagia, encephalopathy, AKI, found to have another acute stroke, developed issues with bladder outlet obstruction with E. coli UTI along with aspiration pneumonia.  Despite appropriate treatment continued to decline, was seen by palliative care and neurology, after discussions between the treatment teams and wife it was decided that patient will be best served with comfort measures, he will be transition to full comfort measures, all non comfort medications will be stopped, goal of care now is comfort only.  Appears to be close to passing away.  Wife bedside agrees with the plan.   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  Compliance)-some concern that he may have occult malignancy that is causing hypercoagulable state given history of weight loss. Appears to have significant dysarthria-and oropharyngeal dysphagia-otherwise exam is difficult Remains on Eliquis  Cortrak tube inserted 12/12 after extensive discussion with family Already had significant decline in function for almost a year before this hospitalization-not sure if he will be able to regain his prior level of function even if he survives this acute hospitalization.  Goals of care ongoing-family discussion in progress-allowing some time for clinical outcomes. CTA planned but not yet done given concern for worsening AKI. Both palliative care/neurology following  Acute metabolic encephalopathy Probably secondary to complicated UTI/aspiration pneumonia/AKI Remains encephalopathic-although slightly improved  Continue to underlying etiologies Continue Seroquel  Requiring restraints to keep cortrak tube in place Recent TSH/ammonia stable Spot EEG negative for seizures Delirium precautions.  AKI Suspect hemodynamically mediated  but continues to worsen Renal ultrasound negative for hydronephrosis Overnight bladder scans were stable-however still have some concerns that he  may be retaining urine given that he has a external urinary sphincter prosthetic device in place.  Continue to monitor with frequent bladder scans-if there is any suspicion that he is retaining-will need to discuss with urology to see if we can place a Foley catheter. In the interim-continue supportive care- avoid nephrotoxic agents Wife understands that if he were to continue to deteriorate-he would likely not be a HD candidate-see below-goals of care in progress.  Addendum Spoke with family-we went over the bladder scan results-for now we have elected to pursue supportive care and not consult urology or place Foley catheter.  Await goals of care discussion-allow time for clinical outcomes.  Metabolic acidosis Secondary to above Resolved with IV bicarb  Hypokalemia Replete/recheck  Aspiration pneumonia Febrile overnight-no leukocytosis Repeat CXR continues to show left-sided infiltrates Continue Rocephin /Flagyl  Remains n.p.o.-has NG tube in place. Remains at risk for ongoing aspiration episodes given ongoing issues with encephalopathy.  Complicated UTI-E. coli Continue IV Rocephin   History of prostate cancer-s/p prostatectomy, s/p external urethral sphincter device See above  Right chest wall mass Felt to be a firm hematoma-has had prior imaging studies before this hospitalization-per spouse-completed a course of doxycycline. General surgery consulted 12/10-family refused open evacuation-supportive care for now.  PAF Telemetry monitoring Eliquis   CAD Eliquis  Holding Lipitor-given history of myasthenia gravis  Prior history of CVA See above  DM 2 CBGs on the higher side-now that he is on tube feeds Continue SSI Add Lantus  8 units and see how he does  Recent Labs    09/13/24 0737 09/13/24 2009 09/14/24 0035  GLUCAP 243*  219* 241*     History of myasthenia gravis Appears stable Given his restlessness/agitation-monitoring neph/FCD will be almost impossible Continue pyridostigmine /prednisone -follow clinically.  History of prostate cancer Device in place for urethral sphincter to urinate Supportive care  Anterior neck swelling Firm right sided neck mass-very hard to discern on exam-given agitation/delirium See below regarding plans for pan CT.  Weight loss Significant weight loss of almost 100 pounds in the past 1 year TSH stable Given recurrent embolic CVA-concern for hypercoagulable state from occult malignancy Once renal function/mentation permits-Will plan an CT.  Palliative care Patient has had significant decline in function for almost a year-this is his second CVA-he now unfortunately appears to have severe oropharyngeal dysphagia-significant slurred speech-after extensive discussion with family-NG tube inserted yesterday-family fully well aware that we will have to use medication like Seroquel /sedation for him to keep the NGT open given his level of encephalopathy.  This morning-appears to have worsening renal function-not any better.  Again had a long discussion with the patient's spouse at bedside-she is aware of the option of transition to comfort measures-she is aware of poor overall long-term prognosis and that he would likely not be back to his prior baseline on discharge.  Will continue to allow time for clinical outcomes-family discussion but suspect patient best benefit from transition to full comfort measures at this point.  Addendum Reached out to spouse/stepson and other family members at bedside-okay for DNR-they are leaning towards initiating hospice/comfort care but are not yet ready to make the decision yet.  Continue IV fluids/IV antibiotics/tube feeds for now-DNR ordered-palliative care/[facility] will reengage with family tomorrow.  Code status:   Code Status: Do not attempt  resuscitation (DNR) - Comfort care   DVT Prophylaxis:    Family Communication: Spouse at bedside 12/13  Disposition Plan: Status is: Inpatient Remains inpatient appropriate because: Severity of illness   Planned Discharge Destination:Skilled nursing facility  Diet: Diet Order             Diet NPO time specified Except for: Ice Chips, Sips with Meds  Diet effective now                     Antimicrobial agents: Anti-infectives (From admission, onward)    Start     Dose/Rate Route Frequency Ordered Stop   09/10/24 1215  cefTRIAXone  (ROCEPHIN ) 2 g in sodium chloride  0.9 % 100 mL IVPB  Status:  Discontinued        2 g 200 mL/hr over 30 Minutes Intravenous Every 24 hours 09/10/24 1120 09/13/24 0801   09/10/24 1215  metroNIDAZOLE  (FLAGYL ) IVPB 500 mg  Status:  Discontinued        500 mg 100 mL/hr over 60 Minutes Intravenous Every 12 hours 09/10/24 1120 09/13/24 0801   09/10/24 0800  Ampicillin -Sulbactam (UNASYN ) 3 g in sodium chloride  0.9 % 100 mL IVPB  Status:  Discontinued        3 g 200 mL/hr over 30 Minutes Intravenous Every 6 hours 09/10/24 0648 09/10/24 1120   09/09/24 1200  cefTRIAXone  (ROCEPHIN ) 1 g in sodium chloride  0.9 % 100 mL IVPB  Status:  Discontinued        1 g 200 mL/hr over 30 Minutes Intravenous Every 24 hours 09/09/24 1133 09/10/24 0644        MEDICATIONS: Scheduled Meds:  fentaNYL  (SUBLIMAZE ) injection  50 mcg Intravenous Q6H   QUEtiapine   50 mg Per Tube QHS   sodium chloride  flush  3 mL Intravenous Q12H   Continuous Infusions:   PRN Meds:.acetaminophen  **OR** acetaminophen , albuterol , artificial tears, atropine , bisacodyl , fentaNYL  (SUBLIMAZE ) injection, glycopyrrolate , haloperidol  lactate, HYDROmorphone  (DILAUDID ) injection, LORazepam  **OR** [DISCONTINUED] LORazepam  **OR** LORazepam , ondansetron  **OR** ondansetron  (ZOFRAN ) IV, polyethylene glycol   I have personally reviewed following labs and imaging studies  LABORATORY  DATA: CBC: Recent Labs  Lab 09/08/24 1117 09/08/24 1418 09/09/24 0348 09/10/24 0244 09/11/24 0340 09/12/24 0431 09/13/24 0558  WBC 7.2  --  6.8 8.8 8.9 6.1 5.5  NEUTROABS 6.2  --   --   --   --   --   --   HGB 12.2*   < > 12.0* 12.8* 11.7* 12.0* 10.6*  HCT 38.2*   < > 39.7 39.6 35.6* 36.4* 33.1*  MCV 90.7  --  97.3 91.0 89.2 90.1 92.7  PLT 133*  --  117* 130* 125* 107* 114*   < > = values in this interval not displayed.    Basic Metabolic Panel: Recent Labs  Lab 09/09/24 0348 09/10/24 0244 09/11/24 0340 09/12/24 0431 09/13/24 0558  NA 143 143 145 146* 146*  K 3.3* 3.5 3.3* 2.7* 3.4*  CL 116* 112* 109 105 107  CO2 14* 13* 18* 28 27  GLUCOSE 80 142* 212* 345* 288*  BUN 24* 27* 34* 31* 30*  CREATININE 1.20 1.49* 1.77* 1.98* 1.60*  CALCIUM 7.3* 8.6* 8.0* 7.8* 8.2*  MG  --   --  2.0 2.0  --   PHOS  --   --  3.1 2.5  --     GFR: Estimated Creatinine Clearance: 41.4 mL/min (A) (by C-G formula based on SCr of 1.6 mg/dL (H)).  Liver Function Tests: Recent Labs  Lab 09/08/24 1117 09/09/24 0348 09/10/24 0244 09/11/24 0340 09/12/24 0431  AST 16 19 15 15 15   ALT 21 15 20 18 17   ALKPHOS 39 34* 43 41 45  BILITOT 2.5* 1.9* 2.5* 1.3* 0.8  PROT 5.4* 4.3* 5.3* 5.2* 5.0*  ALBUMIN 3.5 2.6* 3.3* 2.9* 2.8*   No results for input(s): LIPASE, AMYLASE in the last 168 hours. Recent Labs  Lab 09/08/24 1411  AMMONIA 25    Coagulation Profile: No results for input(s): INR, PROTIME in the last 168 hours.  Cardiac Enzymes: No results for input(s): CKTOTAL, CKMB, CKMBINDEX, TROPONINI in the last 168 hours.  BNP (last 3 results) No results for input(s): PROBNP in the last 8760 hours.  Lipid Profile: No results for input(s): CHOL, HDL, LDLCALC, TRIG, CHOLHDL, LDLDIRECT in the last 72 hours.   Thyroid Function Tests: No results for input(s): TSH, T4TOTAL, FREET4, T3FREE, THYROIDAB in the last 72 hours.   Anemia Panel: No results  for input(s): VITAMINB12, FOLATE, FERRITIN, TIBC, IRON, RETICCTPCT in the last 72 hours.  Urine analysis:    Component Value Date/Time   COLORURINE AMBER (A) 09/08/2024 1500   APPEARANCEUR CLOUDY (A) 09/08/2024 1500   LABSPEC 1.023 09/08/2024 1500   PHURINE 5.0 09/08/2024 1500   GLUCOSEU >=500 (A) 09/08/2024 1500   HGBUR LARGE (A) 09/08/2024 1500   BILIRUBINUR NEGATIVE 09/08/2024 1500   KETONESUR 20 (A) 09/08/2024 1500   PROTEINUR NEGATIVE 09/08/2024 1500   UROBILINOGEN 1.0 07/28/2013 2109   NITRITE POSITIVE (A) 09/08/2024 1500   LEUKOCYTESUR SMALL (A) 09/08/2024 1500    Sepsis Labs: Lactic Acid, Venous    Component Value Date/Time   LATICACIDVEN 1.0 09/10/2024 0727    MICROBIOLOGY: Recent Results (from the past 240 hours)  Urine Culture     Status: Abnormal   Collection Time: 09/08/24  3:00 PM   Specimen: Urine, Random  Result Value Ref Range Status   Specimen Description URINE, RANDOM  Final   Special Requests   Final    NONE Reflexed from [id number] Performed at [facility], [address] [place], [place] [address]    Culture >=100,000 COLONIES/mL ESCHERICHIA COLI (A)  Final   Report Status 09/10/2024 FINAL  Final   Organism ID, Bacteria ESCHERICHIA COLI (A)  Final      Susceptibility   Escherichia coli - MIC*    AMPICILLIN  >=32 RESISTANT Resistant     CEFAZOLIN (URINE) Value in next row Sensitive      8 SENSITIVEThis is a modified FDA-approved test that has been validated and its performance characteristics determined by the reporting laboratory.  This laboratory is certified under the Clinical Laboratory Improvement Amendments CLIA as qualified to perform high complexity clinical laboratory testing.    CEFEPIME Value in next row Sensitive      8 SENSITIVEThis is a modified FDA-approved test that has been validated and its performance characteristics determined by the reporting laboratory.  This laboratory is certified under the Clinical Laboratory  Improvement Amendments CLIA as qualified to perform high complexity clinical laboratory testing.    ERTAPENEM Value in next row Sensitive      8 SENSITIVEThis is a modified FDA-approved test that has been validated and its performance characteristics determined by the reporting laboratory.  This laboratory is certified under the Clinical Laboratory Improvement Amendments CLIA as qualified to perform high complexity clinical laboratory testing.    CEFTRIAXONE  Value in next row Sensitive      8 SENSITIVEThis is a modified FDA-approved test that has been validated and its performance characteristics determined by the reporting laboratory.  This laboratory is certified under the Clinical Laboratory Improvement Amendments CLIA as qualified to perform high complexity clinical laboratory testing.    CIPROFLOXACIN  Value in next row  Resistant      8 SENSITIVEThis is a modified FDA-approved test that has been validated and its performance characteristics determined by the reporting laboratory.  This laboratory is certified under the Clinical Laboratory Improvement Amendments CLIA as qualified to perform high complexity clinical laboratory testing.    GENTAMICIN Value in next row Resistant      8 SENSITIVEThis is a modified FDA-approved test that has been validated and its performance characteristics determined by the reporting laboratory.  This laboratory is certified under the Clinical Laboratory Improvement Amendments CLIA as qualified to perform high complexity clinical laboratory testing.    NITROFURANTOIN Value in next row Intermediate      8 SENSITIVEThis is a modified FDA-approved test that has been validated and its performance characteristics determined by the reporting laboratory.  This laboratory is certified under the Clinical Laboratory Improvement Amendments CLIA as qualified to perform high complexity clinical laboratory testing.    TRIMETH/SULFA Value in next row Resistant      8 SENSITIVEThis is a  modified FDA-approved test that has been validated and its performance characteristics determined by the reporting laboratory.  This laboratory is certified under the Clinical Laboratory Improvement Amendments CLIA as qualified to perform high complexity clinical laboratory testing.    AMPICILLIN /SULBACTAM Value in next row Intermediate      8 SENSITIVEThis is a modified FDA-approved test that has been validated and its performance characteristics determined by the reporting laboratory.  This laboratory is certified under the Clinical Laboratory Improvement Amendments CLIA as qualified to perform high complexity clinical laboratory testing.    PIP/TAZO Value in next row Sensitive      <=4 SENSITIVEThis is a modified FDA-approved test that has been validated and its performance characteristics determined by the reporting laboratory.  This laboratory is certified under the Clinical Laboratory Improvement Amendments CLIA as qualified to perform high complexity clinical laboratory testing.    MEROPENEM Value in next row Sensitive      <=4 SENSITIVEThis is a modified FDA-approved test that has been validated and its performance characteristics determined by the reporting laboratory.  This laboratory is certified under the Clinical Laboratory Improvement Amendments CLIA as qualified to perform high complexity clinical laboratory testing.    * >=100,000 COLONIES/mL ESCHERICHIA COLI  Blood culture (routine x 2)     Status: None   Collection Time: 09/08/24  3:03 PM   Specimen: BLOOD  Result Value Ref Range Status   Specimen Description BLOOD SITE NOT SPECIFIED  Final   Special Requests   Final    BOTTLES DRAWN AEROBIC AND ANAEROBIC Blood Culture adequate volume   Culture   Final    NO GROWTH 5 DAYS Performed at [facility], [address] [place], [place] [address]    Report Status 09/13/2024 FINAL  Final  Blood culture (routine x 2)     Status: None   Collection Time: 09/08/24  3:21 PM    Specimen: BLOOD  Result Value Ref Range Status   Specimen Description BLOOD SITE NOT SPECIFIED  Final   Special Requests   Final    BOTTLES DRAWN AEROBIC ONLY Blood Culture results may not be optimal due to an inadequate volume of blood received in culture bottles   Culture   Final    NO GROWTH 5 DAYS Performed at [facility], [address] [place], [place] [address]    Report Status 09/13/2024 FINAL  Final  Culture, blood (Routine X 2) w Reflex to ID Panel  Status: None (Preliminary result)   Collection Time: 09/10/24  3:44 AM   Specimen: BLOOD RIGHT ARM  Result Value Ref Range Status   Specimen Description BLOOD RIGHT ARM  Final   Special Requests   Final    BOTTLES DRAWN AEROBIC AND ANAEROBIC Blood Culture adequate volume   Culture   Final    NO GROWTH 3 DAYS Performed at [facility], [address], [place], [place] [address]    Report Status PENDING  Incomplete  Culture, blood (Routine X 2) w Reflex to ID Panel     Status: None (Preliminary result)   Collection Time: 09/10/24  3:54 AM   Specimen: BLOOD RIGHT ARM  Result Value Ref Range Status   Specimen Description BLOOD RIGHT ARM  Final   Special Requests   Final    BOTTLES DRAWN AEROBIC AND ANAEROBIC Blood Culture adequate volume   Culture   Final    NO GROWTH 3 DAYS Performed at [facility], [address] [place], [place] [address]    Report Status PENDING  Incomplete    RADIOLOGY STUDIES/RESULTS: DG Chest Port 1 View Result Date: 09/13/2024 CLINICAL DATA:  Shortness of breath. EXAM: PORTABLE CHEST 1 VIEW COMPARISON:  09/12/2024. FINDINGS: A feeding tube passes into the stomach although the distal tip position is not included on the film. Left base atelectasis/infiltrate is similar to prior with small left pleural effusion. No pulmonary edema. Calcified pleural plaques. The cardio pericardial silhouette is enlarged. Telemetry leads overlie the chest. IMPRESSION: Persistent left base  atelectasis/infiltrate with small left pleural effusion. Electronically Signed   By: Camellia Candle M.D.   On: 09/13/2024 07:08   DG Chest Port 1V same Day Result Date: 09/12/2024 CLINICAL DATA:  Shortness of breath. EXAM: PORTABLE CHEST 1 VIEW COMPARISON:  09/10/2024 FINDINGS: The cardio pericardial silhouette is enlarged. Similar left base collapse/consolidation. Calcified pleural plaques again noted. A feeding tube passes into the stomach although the distal tip position is not included on the film. IMPRESSION: 1. Similar left base collapse/consolidation. 2. Calcified pleural plaques. Electronically Signed   By: Camellia Candle M.D.   On: 09/12/2024 09:16     LOS: 6 days   Lavada Stank, MD  [facility]    To contact the attending provider between 7A-7P or the covering provider during after hours 7P-7A, please log into the web site [email or web address] and access using universal [place] password for that web site. If you do not have the password, please call the hospital operator.  09/14/2024, 8:01 AM

## 2024-09-15 DIAGNOSIS — Z515 Encounter for palliative care: Secondary | ICD-10-CM

## 2024-09-15 LAB — CULTURE, BLOOD (ROUTINE X 2)
Culture: NO GROWTH
Culture: NO GROWTH
Special Requests: ADEQUATE
Special Requests: ADEQUATE

## 2024-09-15 LAB — VITAMIN K1, SERUM: VITAMIN K1: 0.26 ng/mL (ref 0.10–2.20)

## 2024-10-01 NOTE — Death Summary Note (Signed)
 Death summary note  Tyrone Newton, is a 79 y.o. male, DOB - June 23, 1946, FMW:[id number]  Admit date - 09/08/2024   Admitting Physician Marsa KATHEE Scurry, MD  Outpatient Primary MD for the patient is Milissa Savant, MD  LOS - 7  Chief Complaint  Patient presents with   Altered Mental Status       Notification: Milissa Savant, MD notified of death of 2024/09/16   Date and Time of Death -09/16/24, 6:40 AM  Pronounced by -RN  History of present illness:   Tyrone Newton is a 79 y.o. male with a history of -  myasthenia gravis, CVA, atrial fibrillation, CAD, prostate cancer status post prostatectomy who presented to the hospital with generalized weakness, dysphagia, encephalopathy, AKI, found to have another acute stroke, developed issues with bladder outlet obstruction with E. coli UTI along with aspiration pneumonia.  Despite appropriate treatment continued to decline, was seen by palliative care and neurology, after discussions between the treatment teams and wife it was decided that patient will be best served with comfort measures, he will be transition to full comfort measures, all non comfort medications will be stopped, goal of care now is comfort only.  Passed away in comfort on Sep 16, 2024 at 6:40 AM pronounced by RN.   Final Diagnoses:  Cause of death - CVA, UTI, pneumonia  Signature  -    Lavada Stank M.D on 09/16/24 at 7:14 AM   -  To page go to [email or web address]   Total clinical and documentation time for today Under 30 minutes   Last Note                            PROGRESS NOTE        PATIENT DETAILS Name: Tyrone Newton Age: 79 y.o. Sex: male Date of Birth: [birth year] Admit Date:  09/08/2024 Admitting Physician Marsa KATHEE Scurry, MD ERE:Tnnid, Savant, MD  Brief Summary: Patient is a 79 y.o.  male with history of myasthenia gravis, CVA, PAF, CAD, prostate cancer-s/p prostatectomy-presented with difficulty ambulating, slurred speech-upon further eval-found to have acute CVA.  Further hospital course complicated by fever secondary to probable complicated UTI/aspiration pneumonia, and delirium.  Significant events: 12/9>> admit to [facility] 12/11>> confused overnight-febrile-repeat chest x-ray with possible left-sided consolidation-on IV Rocephin -Flagyl  added 12/12>> failed MBS-after discussion  with family-Cortrak tube inserted-palliative care consulted  Significant studies: 12/9>> MRI brain: Acute infarcts in the cerebellum bilaterally.  Suspected acute punctate infarct in the left frontal/right frontal occipital lobes. 12/10>> CT head: No acute intracranial hemorrhage 12/10>> echo: EF 60-65% moderate MR, mild to moderate AR 12/10>> LDL: 31 12/11>> CXR: Interval progression of retrocardiac collapse/consolidation with small left pleural effusion 12/11>> A1c: 10.8 12/11>> EEG: No seizures. 12/12>> renal ultrasound: No hydronephrosis  Significant microbiology data: 12/9>> urine culture: E. coli 12/9>> blood culture: No growth 12/11>> blood culture: No growth  Procedures: None  Consults: Neurology Palliative  Subjective: Patient in bed appears to be in no distress and unresponsive on narcotics, under full comfort care  Objective: Vitals: Blood pressure (!) 157/88, pulse 96, temperature 98.9 F (37.2 C), temperature source Axillary, resp. rate (!) 35, height 6' 3 (1.905 m), weight 79.4 kg, SpO2 93%.   Exam: Sleeping/sedated-in no distress, under full comfort measures now   Assessment/Plan:  With history of myasthenia gravis, CVA, atrial fibrillation, CAD, prostate cancer status post prostatectomy who presented to the hospital with generalized weakness,  dysphagia, encephalopathy, AKI, found to have another acute stroke, developed issues with bladder outlet obstruction with E. coli UTI along with aspiration pneumonia.  Despite appropriate treatment continued to decline, was seen by palliative care and neurology, after discussions between the treatment teams and wife it was decided that patient will be best served with comfort measures, he will be transition to full comfort measures, all non comfort medications will be stopped, goal of care now is comfort only.  Appears to be close to passing away.  Wife bedside agrees with the plan.   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  Compliance)-some concern that he may have occult malignancy that is causing hypercoagulable state given history of weight loss. Appears to have significant dysarthria-and oropharyngeal dysphagia-otherwise exam is difficult Remains on Eliquis  Cortrak tube inserted 12/12 after extensive discussion with family Already had significant decline in function for almost a year before this hospitalization-not sure if he will be able to regain his prior level of function even if he survives this acute hospitalization.  Goals of care ongoing-family discussion in progress-allowing some time for clinical outcomes. CTA planned but not yet done given concern for worsening AKI. Both palliative care/neurology following  Acute metabolic encephalopathy Probably secondary to complicated UTI/aspiration pneumonia/AKI Remains encephalopathic-although slightly improved  Continue to underlying etiologies Continue Seroquel  Requiring restraints to keep cortrak tube in place Recent TSH/ammonia stable Spot EEG negative for seizures Delirium precautions.  AKI Suspect hemodynamically mediated but continues to worsen Renal ultrasound negative for hydronephrosis Overnight bladder scans were stable-however still have  some concerns that he may be retaining urine given that he has a external urinary sphincter prosthetic device in place.  Continue to monitor with frequent bladder scans-if there is any suspicion that he is retaining-will need to discuss with urology to see if we can place a Foley catheter. In the interim-continue supportive care- avoid nephrotoxic agents Wife understands that if he were to continue to deteriorate-he would likely not be a HD candidate-see below-goals of care in progress.  Addendum Spoke with family-we went over the bladder scan results-for now we have elected to pursue supportive care and not consult urology or place Foley catheter.  Await goals of care discussion-allow time for clinical outcomes.  Metabolic acidosis Secondary to above Resolved with IV bicarb  Hypokalemia Replete/recheck  Aspiration pneumonia Febrile overnight-no leukocytosis Repeat CXR continues to show left-sided infiltrates Continue Rocephin /Flagyl  Remains n.p.o.-has NG tube in place. Remains at risk for ongoing aspiration episodes given ongoing issues with encephalopathy.  Complicated UTI-E. coli Continue IV Rocephin   History of prostate cancer-s/p prostatectomy, s/p external urethral sphincter device See above  Right chest wall mass Felt to be a firm hematoma-has had prior imaging studies before this hospitalization-per spouse-completed a course of doxycycline. General surgery consulted 12/10-family refused open evacuation-supportive care for now.  PAF Telemetry monitoring Eliquis   CAD Eliquis  Holding Lipitor-given history of myasthenia gravis  Prior history of CVA See above  DM 2 CBGs on the higher side-now that he is on tube feeds Continue SSI Add Lantus  8 units and see how he does  Recent Labs    09/13/24 0737 09/13/24 2009 09/14/24 0035  GLUCAP 243* 219* 241*     History of myasthenia gravis Appears stable Given his restlessness/agitation-monitoring neph/FCD will be  almost impossible Continue pyridostigmine /prednisone -follow clinically.  History of prostate cancer Device in place for urethral sphincter to urinate Supportive care  Anterior neck swelling Firm right sided neck mass-very hard to discern on exam-given agitation/delirium See below regarding plans for pan CT.  Weight loss Significant weight loss of almost 100 pounds in the past 1 year TSH stable Given recurrent embolic CVA-concern for hypercoagulable state from occult malignancy Once renal function/mentation permits-Will plan an CT.  Palliative care Patient has had significant decline in function for almost a year-this is his second CVA-he now unfortunately appears to have severe oropharyngeal dysphagia-significant slurred speech-after extensive discussion with family-NG tube inserted yesterday-family fully well aware that we will have to use medication like Seroquel /sedation for him to keep the NGT open given his level of encephalopathy.  This morning-appears to have worsening renal function-not any better.  Again had a long discussion with the patient's spouse at bedside-she is aware of the option of transition to comfort measures-she is aware of poor overall long-term prognosis and that he would likely not be back to his prior baseline on discharge.  Will continue to allow time for clinical outcomes-family discussion but suspect patient best benefit from transition to full comfort measures at this point.  Addendum Reached out to spouse/stepson and other family members at bedside-okay for DNR-they are leaning towards initiating hospice/comfort care but are not yet ready to make the decision yet.  Continue IV fluids/IV antibiotics/tube feeds for now-DNR ordered-palliative care/[facility] will reengage with family tomorrow.  Code status:   Code Status: Do not attempt resuscitation (DNR) - Comfort care   DVT Prophylaxis:    Family Communication: Spouse at bedside 12/13  Disposition Plan: Status  is: Inpatient Remains inpatient appropriate because: Severity of illness   Planned Discharge Destination:Skilled nursing facility   Diet: Diet Order             Diet NPO time specified Except for: Ice Chips, Sips with Meds  Diet effective now                     Antimicrobial agents: Anti-infectives (From admission, onward)    Start     Dose/Rate Route Frequency Ordered Stop   09/10/24 1215  cefTRIAXone  (ROCEPHIN ) 2 g in sodium chloride  0.9 % 100 mL IVPB  Status:  Discontinued        2 g 200 mL/hr over 30 Minutes Intravenous Every 24 hours 09/10/24 1120 09/13/24 0801   09/10/24 1215  metroNIDAZOLE  (FLAGYL ) IVPB 500 mg  Status:  Discontinued  500 mg 100 mL/hr over 60 Minutes Intravenous Every 12 hours 09/10/24 1120 09/13/24 0801   09/10/24 0800  Ampicillin -Sulbactam (UNASYN ) 3 g in sodium chloride  0.9 % 100 mL IVPB  Status:  Discontinued        3 g 200 mL/hr over 30 Minutes Intravenous Every 6 hours 09/10/24 0648 09/10/24 1120   09/09/24 1200  cefTRIAXone  (ROCEPHIN ) 1 g in sodium chloride  0.9 % 100 mL IVPB  Status:  Discontinued        1 g 200 mL/hr over 30 Minutes Intravenous Every 24 hours 09/09/24 1133 09/10/24 0644        MEDICATIONS: Scheduled Meds:  fentaNYL  (SUBLIMAZE ) injection  50 mcg Intravenous Q4H   Continuous Infusions:   PRN Meds:.acetaminophen  **OR** acetaminophen , albuterol , artificial tears, atropine , bisacodyl , fentaNYL  (SUBLIMAZE ) injection, glycopyrrolate , haloperidol  lactate, HYDROmorphone  (DILAUDID ) injection, LORazepam  **OR** [DISCONTINUED] LORazepam  **OR** LORazepam , ondansetron  **OR** ondansetron  (ZOFRAN ) IV, polyethylene glycol   I have personally reviewed following labs and imaging studies  LABORATORY DATA: CBC: Recent Labs  Lab 09/08/24 1117 09/08/24 1418 09/09/24 0348 09/10/24 0244 09/11/24 0340 09/12/24 0431 09/13/24 0558  WBC 7.2  --  6.8 8.8 8.9 6.1 5.5  NEUTROABS 6.2  --   --   --   --   --   --   HGB 12.2*   < >  12.0* 12.8* 11.7* 12.0* 10.6*  HCT 38.2*   < > 39.7 39.6 35.6* 36.4* 33.1*  MCV 90.7  --  97.3 91.0 89.2 90.1 92.7  PLT 133*  --  117* 130* 125* 107* 114*   < > = values in this interval not displayed.    Basic Metabolic Panel: Recent Labs  Lab 09/09/24 0348 09/10/24 0244 09/11/24 0340 09/12/24 0431 09/13/24 0558  NA 143 143 145 146* 146*  K 3.3* 3.5 3.3* 2.7* 3.4*  CL 116* 112* 109 105 107  CO2 14* 13* 18* 28 27  GLUCOSE 80 142* 212* 345* 288*  BUN 24* 27* 34* 31* 30*  CREATININE 1.20 1.49* 1.77* 1.98* 1.60*  CALCIUM 7.3* 8.6* 8.0* 7.8* 8.2*  MG  --   --  2.0 2.0  --   PHOS  --   --  3.1 2.5  --     GFR: Estimated Creatinine Clearance: 41.4 mL/min (A) (by C-G formula based on SCr of 1.6 mg/dL (H)).  Liver Function Tests: Recent Labs  Lab 09/08/24 1117 09/09/24 0348 09/10/24 0244 09/11/24 0340 09/12/24 0431  AST 16 19 15 15 15   ALT 21 15 20 18 17   ALKPHOS 39 34* 43 41 45  BILITOT 2.5* 1.9* 2.5* 1.3* 0.8  PROT 5.4* 4.3* 5.3* 5.2* 5.0*  ALBUMIN 3.5 2.6* 3.3* 2.9* 2.8*   No results for input(s): LIPASE, AMYLASE in the last 168 hours. Recent Labs  Lab 09/08/24 1411  AMMONIA 25    Coagulation Profile: No results for input(s): INR, PROTIME in the last 168 hours.  Cardiac Enzymes: No results for input(s): CKTOTAL, CKMB, CKMBINDEX, TROPONINI in the last 168 hours.  BNP (last 3 results) No results for input(s): PROBNP in the last 8760 hours.  Lipid Profile: No results for input(s): CHOL, HDL, LDLCALC, TRIG, CHOLHDL, LDLDIRECT in the last 72 hours.   Thyroid Function Tests: No results for input(s): TSH, T4TOTAL, FREET4, T3FREE, THYROIDAB in the last 72 hours.   Anemia Panel: No results for input(s): VITAMINB12, FOLATE, FERRITIN, TIBC, IRON, RETICCTPCT in the last 72 hours.  Urine analysis:    Component Value Date/Time   COLORURINE AMBER (  A) 09/08/2024 1500   APPEARANCEUR CLOUDY (A) 09/08/2024 1500    LABSPEC 1.023 09/08/2024 1500   PHURINE 5.0 09/08/2024 1500   GLUCOSEU >=500 (A) 09/08/2024 1500   HGBUR LARGE (A) 09/08/2024 1500   BILIRUBINUR NEGATIVE 09/08/2024 1500   KETONESUR 20 (A) 09/08/2024 1500   PROTEINUR NEGATIVE 09/08/2024 1500   UROBILINOGEN 1.0 07/28/2013 2109   NITRITE POSITIVE (A) 09/08/2024 1500   LEUKOCYTESUR SMALL (A) 09/08/2024 1500    Sepsis Labs: Lactic Acid, Venous    Component Value Date/Time   LATICACIDVEN 1.0 09/10/2024 0727    MICROBIOLOGY: Recent Results (from the past 240 hours)  Urine Culture     Status: Abnormal   Collection Time: 09/08/24  3:00 PM   Specimen: Urine, Random  Result Value Ref Range Status   Specimen Description URINE, RANDOM  Final   Special Requests   Final    NONE Reflexed from [id number] Performed at [facility], [address], [place], [place] [address]    Culture >=100,000 COLONIES/mL ESCHERICHIA COLI (A)  Final   Report Status 09/10/2024 FINAL  Final   Organism ID, Bacteria ESCHERICHIA COLI (A)  Final      Susceptibility   Escherichia coli - MIC*    AMPICILLIN  >=32 RESISTANT Resistant     CEFAZOLIN (URINE) Value in next row Sensitive      8 SENSITIVEThis is a modified FDA-approved test that has been validated and its performance characteristics determined by the reporting laboratory.  This laboratory is certified under the Clinical Laboratory Improvement Amendments CLIA as qualified to perform high complexity clinical laboratory testing.    CEFEPIME Value in next row Sensitive      8 SENSITIVEThis is a modified FDA-approved test that has been validated and its performance characteristics determined by the reporting laboratory.  This laboratory is certified under the Clinical Laboratory Improvement Amendments CLIA as qualified to perform high complexity clinical laboratory testing.    ERTAPENEM Value in next row Sensitive      8 SENSITIVEThis is a modified FDA-approved test that has been validated and its performance  characteristics determined by the reporting laboratory.  This laboratory is certified under the Clinical Laboratory Improvement Amendments CLIA as qualified to perform high complexity clinical laboratory testing.    CEFTRIAXONE  Value in next row Sensitive      8 SENSITIVEThis is a modified FDA-approved test that has been validated and its performance characteristics determined by the reporting laboratory.  This laboratory is certified under the Clinical Laboratory Improvement Amendments CLIA as qualified to perform high complexity clinical laboratory testing.    CIPROFLOXACIN  Value in next row Resistant      8 SENSITIVEThis is a modified FDA-approved test that has been validated and its performance characteristics determined by the reporting laboratory.  This laboratory is certified under the Clinical Laboratory Improvement Amendments CLIA as qualified to perform high complexity clinical laboratory testing.    GENTAMICIN Value in next row Resistant      8 SENSITIVEThis is a modified FDA-approved test that has been validated and its performance characteristics determined by the reporting laboratory.  This laboratory is certified under the Clinical Laboratory Improvement Amendments CLIA as qualified to perform high complexity clinical laboratory testing.    NITROFURANTOIN Value in next row Intermediate      8 SENSITIVEThis is a modified FDA-approved test that has been validated and its performance characteristics determined by the reporting laboratory.  This laboratory is certified under the Clinical Laboratory Improvement Amendments CLIA as qualified to  perform high complexity clinical laboratory testing.    TRIMETH/SULFA Value in next row Resistant      8 SENSITIVEThis is a modified FDA-approved test that has been validated and its performance characteristics determined by the reporting laboratory.  This laboratory is certified under the Clinical Laboratory Improvement Amendments CLIA as qualified to  perform high complexity clinical laboratory testing.    AMPICILLIN /SULBACTAM Value in next row Intermediate      8 SENSITIVEThis is a modified FDA-approved test that has been validated and its performance characteristics determined by the reporting laboratory.  This laboratory is certified under the Clinical Laboratory Improvement Amendments CLIA as qualified to perform high complexity clinical laboratory testing.    PIP/TAZO Value in next row Sensitive      <=4 SENSITIVEThis is a modified FDA-approved test that has been validated and its performance characteristics determined by the reporting laboratory.  This laboratory is certified under the Clinical Laboratory Improvement Amendments CLIA as qualified to perform high complexity clinical laboratory testing.    MEROPENEM Value in next row Sensitive      <=4 SENSITIVEThis is a modified FDA-approved test that has been validated and its performance characteristics determined by the reporting laboratory.  This laboratory is certified under the Clinical Laboratory Improvement Amendments CLIA as qualified to perform high complexity clinical laboratory testing.    * >=100,000 COLONIES/mL ESCHERICHIA COLI  Blood culture (routine x 2)     Status: None   Collection Time: 09/08/24  3:03 PM   Specimen: BLOOD  Result Value Ref Range Status   Specimen Description BLOOD SITE NOT SPECIFIED  Final   Special Requests   Final    BOTTLES DRAWN AEROBIC AND ANAEROBIC Blood Culture adequate volume   Culture   Final    NO GROWTH 5 DAYS Performed at [facility], [address], [place], [place] [address]    Report Status 09/13/2024 FINAL  Final  Blood culture (routine x 2)     Status: None   Collection Time: 09/08/24  3:21 PM   Specimen: BLOOD  Result Value Ref Range Status   Specimen Description BLOOD SITE NOT SPECIFIED  Final   Special Requests   Final    BOTTLES DRAWN AEROBIC ONLY Blood Culture results may not be optimal due to an inadequate volume of  blood received in culture bottles   Culture   Final    NO GROWTH 5 DAYS Performed at [facility], [address], [place], [place] [address]    Report Status 09/13/2024 FINAL  Final  Culture, blood (Routine X 2) w Reflex to ID Panel     Status: None (Preliminary result)   Collection Time: 09/10/24  3:44 AM   Specimen: BLOOD RIGHT ARM  Result Value Ref Range Status   Specimen Description BLOOD RIGHT ARM  Final   Special Requests   Final    BOTTLES DRAWN AEROBIC AND ANAEROBIC Blood Culture adequate volume   Culture   Final    NO GROWTH 4 DAYS Performed at [facility], [address] [place], [place] [address]    Report Status PENDING  Incomplete  Culture, blood (Routine X 2) w Reflex to ID Panel     Status: None (Preliminary result)   Collection Time: 09/10/24  3:54 AM   Specimen: BLOOD RIGHT ARM  Result Value Ref Range Status   Specimen Description BLOOD RIGHT ARM  Final   Special Requests   Final    BOTTLES DRAWN AEROBIC AND  ANAEROBIC Blood Culture adequate volume   Culture   Final    NO GROWTH 4 DAYS Performed at [facility], [address] [place], [place] [address]    Report Status PENDING  Incomplete    RADIOLOGY STUDIES/RESULTS: No results found.    LOS: 7 days   Lavada Stank, MD  [facility]    To contact the attending provider between 7A-7P or the covering provider during after hours 7P-7A, please log into the web site [email or web address] and access using universal [place] password for that web site. If you do not have the password, please call the hospital operator.  09/19/24, 7:14 AM

## 2024-10-01 NOTE — Progress Notes (Signed)
 [facility] night cross cover note:   Patient, who was comfort care, has passed away. Wife present at bedside.   TOD noted to be 0640.     Eva Pore, DO Hospitalist

## 2024-10-01 NOTE — Plan of Care (Signed)
° °  Palliative Medicine Inpatient Follow Up Note  I spoke with patients spouse, Tyrone Newton on the phone this morning as Tyrone Newton had passed away in the early morning hours.   I was able to offer condolences.   Post mortem questions were addressed.   No Charge ______________________________________________________________________________________ Tyrone Newton [place] Palliative Medicine Team Team Cell Phone: [phone] Please utilize secure chat with additional questions, if there is no response within 30 minutes please call the above phone number  Palliative Medicine Team providers are available by phone from 7am to 7pm daily and can be reached through the team cell phone.  Should this patient require assistance outside of these hours, please call the patient's attending physician.

## 2024-10-01 NOTE — Plan of Care (Signed)

## 2024-10-01 NOTE — Consult Note (Signed)
 WOC Nurse Consult Note: Reason for Consult: sacral and R chest wound  Wound type: 1.  Deep Tissue Pressure Injury sacrum/coccyx and B buttocks purple maroon discoloration with significant lifting and sloughing of skin  2.  R chest wall hematoma with partial thickness skin loss  Pressure Injury POA: no  Measurement: see nursing flowsheet  Wound bed: as above  Drainage (amount, consistency, odor) see nursing flowsheet  Periwound: erythema  Dressing procedure/placement/frequency: Cleanse sacrum/coccyx and B buttocks with Vashe, do not rinse. Apply Xeroform gauze ([place] [phone]) to entire wound bed daily and secure with silicone foam or ABD pad and clothe tape whichever is preferred.  Cleanse R chest wall hematoma with Vashe, do not rinse. Apply Xeroform gauze to wound daily and secure with silicone foam.  Patient would benefit from a low air loss mattress for pressure redistribution.   POC discussed with bedside nurse. WOC team will follow every 7 to 10 days  to assess area.  Patient is noted to be on comfort care at this time.   Thank you,    Powell Bar MSN, RN-BC, [employer]

## 2024-10-01 NOTE — Progress Notes (Addendum)
 Patient's wife summoned undersigned to the room, upon arrival pt. without heart sounds or respirations for > 1 minute. Pt. pronounced deceased @ [phone] by this RN and charge nurse Camellia PEAK. Cross cover MD Eva Pore  made aware.

## 2024-10-01 DEATH — deceased
